# Patient Record
Sex: Male | Born: 1935 | Race: White | Hispanic: No | Marital: Married | Smoking: Former smoker
Health system: Southern US, Community
[De-identification: ages and names within clinical notes are randomized; demographics above are authoritative.]

## PROBLEM LIST (undated history)

## (undated) DIAGNOSIS — I2510 Atherosclerotic heart disease of native coronary artery without angina pectoris: Secondary | ICD-10-CM

## (undated) DIAGNOSIS — Z978 Presence of other specified devices: Secondary | ICD-10-CM

## (undated) DIAGNOSIS — I4510 Unspecified right bundle-branch block: Secondary | ICD-10-CM

## (undated) DIAGNOSIS — L989 Disorder of the skin and subcutaneous tissue, unspecified: Secondary | ICD-10-CM

## (undated) DIAGNOSIS — K219 Gastro-esophageal reflux disease without esophagitis: Secondary | ICD-10-CM

## (undated) DIAGNOSIS — K08109 Complete loss of teeth, unspecified cause, unspecified class: Secondary | ICD-10-CM

## (undated) DIAGNOSIS — I10 Essential (primary) hypertension: Secondary | ICD-10-CM

## (undated) DIAGNOSIS — H269 Unspecified cataract: Secondary | ICD-10-CM

## (undated) DIAGNOSIS — N451 Epididymitis: Secondary | ICD-10-CM

## (undated) DIAGNOSIS — R413 Other amnesia: Secondary | ICD-10-CM

## (undated) DIAGNOSIS — R339 Retention of urine, unspecified: Secondary | ICD-10-CM

## (undated) DIAGNOSIS — E785 Hyperlipidemia, unspecified: Secondary | ICD-10-CM

## (undated) DIAGNOSIS — E872 Acidosis: Secondary | ICD-10-CM

## (undated) DIAGNOSIS — I219 Acute myocardial infarction, unspecified: Secondary | ICD-10-CM

## (undated) DIAGNOSIS — Z0000 Encounter for general adult medical examination without abnormal findings: Secondary | ICD-10-CM

## (undated) DIAGNOSIS — N400 Enlarged prostate without lower urinary tract symptoms: Secondary | ICD-10-CM

## (undated) DIAGNOSIS — Z8719 Personal history of other diseases of the digestive system: Secondary | ICD-10-CM

## (undated) DIAGNOSIS — Z87828 Personal history of other (healed) physical injury and trauma: Secondary | ICD-10-CM

## (undated) DIAGNOSIS — N433 Hydrocele, unspecified: Secondary | ICD-10-CM

## (undated) DIAGNOSIS — I491 Atrial premature depolarization: Secondary | ICD-10-CM

## (undated) DIAGNOSIS — Z973 Presence of spectacles and contact lenses: Secondary | ICD-10-CM

## (undated) HISTORY — PX: CHOLECYSTECTOMY OPEN: SUR202

## (undated) HISTORY — DX: Epididymitis: N45.1

## (undated) HISTORY — PX: CORONARY ANGIOPLASTY: SHX604

## (undated) HISTORY — DX: Disorder of the skin and subcutaneous tissue, unspecified: L98.9

## (undated) HISTORY — DX: Acute myocardial infarction, unspecified: I21.9

## (undated) HISTORY — PX: TONSILLECTOMY: SUR1361

## (undated) HISTORY — DX: Encounter for general adult medical examination without abnormal findings: Z00.00

## (undated) HISTORY — DX: Gastro-esophageal reflux disease without esophagitis: K21.9

## (undated) HISTORY — PX: INGUINAL HERNIA REPAIR: SUR1180

## (undated) HISTORY — PX: APPENDECTOMY: SHX54

## (undated) HISTORY — PX: CHOLECYSTECTOMY: SHX55

## (undated) HISTORY — PX: HIATAL HERNIA REPAIR: SHX195

## (undated) HISTORY — PX: CORONARY ANGIOPLASTY WITH STENT PLACEMENT: SHX49

## (undated) HISTORY — DX: Essential (primary) hypertension: I10

## (undated) HISTORY — DX: Atherosclerotic heart disease of native coronary artery without angina pectoris: I25.10

## (undated) HISTORY — PX: CARDIAC CATHETERIZATION: SHX172

## (undated) HISTORY — DX: Hyperlipidemia, unspecified: E78.5

---
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
## 1994-04-14 HISTORY — PX: CORONARY ANGIOPLASTY WITH STENT PLACEMENT: SHX49

## 2000-04-22 ENCOUNTER — Inpatient Hospital Stay (HOSPITAL_COMMUNITY): Admission: EM | Admit: 2000-04-22 | Discharge: 2000-04-25 | Payer: Self-pay | Admitting: Emergency Medicine

## 2000-04-22 ENCOUNTER — Encounter: Payer: Self-pay | Admitting: Emergency Medicine

## 2000-10-23 ENCOUNTER — Encounter: Payer: Self-pay | Admitting: *Deleted

## 2000-10-23 ENCOUNTER — Inpatient Hospital Stay (HOSPITAL_COMMUNITY): Admission: EM | Admit: 2000-10-23 | Discharge: 2000-11-04 | Payer: Self-pay | Admitting: Emergency Medicine

## 2000-10-27 ENCOUNTER — Encounter: Payer: Self-pay | Admitting: *Deleted

## 2000-10-28 ENCOUNTER — Encounter: Payer: Self-pay | Admitting: *Deleted

## 2004-04-19 ENCOUNTER — Ambulatory Visit: Payer: Self-pay | Admitting: Cardiology

## 2004-10-17 ENCOUNTER — Ambulatory Visit: Payer: Self-pay | Admitting: Internal Medicine

## 2004-10-31 ENCOUNTER — Ambulatory Visit: Payer: Self-pay | Admitting: Internal Medicine

## 2005-10-20 ENCOUNTER — Ambulatory Visit: Payer: Self-pay | Admitting: Internal Medicine

## 2005-10-22 ENCOUNTER — Ambulatory Visit: Payer: Self-pay | Admitting: Internal Medicine

## 2006-04-02 ENCOUNTER — Ambulatory Visit: Payer: Self-pay | Admitting: Cardiovascular Disease

## 2006-04-03 ENCOUNTER — Ambulatory Visit: Payer: Self-pay | Admitting: Cardiology

## 2006-04-03 ENCOUNTER — Inpatient Hospital Stay (HOSPITAL_BASED_OUTPATIENT_CLINIC_OR_DEPARTMENT_OTHER): Admission: RE | Admit: 2006-04-03 | Discharge: 2006-04-03 | Payer: Self-pay | Admitting: Cardiovascular Disease

## 2006-04-21 ENCOUNTER — Ambulatory Visit: Payer: Self-pay | Admitting: Cardiology

## 2006-09-23 ENCOUNTER — Telehealth: Payer: Self-pay | Admitting: *Deleted

## 2006-10-30 ENCOUNTER — Ambulatory Visit: Payer: Self-pay | Admitting: Internal Medicine

## 2006-10-30 LAB — CONVERTED CEMR LAB
Cholesterol: 120 mg/dL (ref 0–200)
HDL: 31.1 mg/dL — ABNORMAL LOW (ref >39.0)
LDL Cholesterol: 65 mg/dL (ref 0–99)
Total CHOL/HDL Ratio: 3.9
Triglycerides: 120 mg/dL (ref 0–149)
VLDL: 24 mg/dL (ref 0–40)

## 2007-03-05 ENCOUNTER — Ambulatory Visit: Payer: Self-pay | Admitting: Family Medicine

## 2007-03-05 DIAGNOSIS — L989 Disorder of the skin and subcutaneous tissue, unspecified: Secondary | ICD-10-CM | POA: Insufficient documentation

## 2007-03-05 DIAGNOSIS — E782 Mixed hyperlipidemia: Secondary | ICD-10-CM | POA: Insufficient documentation

## 2007-03-05 DIAGNOSIS — I259 Chronic ischemic heart disease, unspecified: Secondary | ICD-10-CM | POA: Insufficient documentation

## 2007-04-29 ENCOUNTER — Ambulatory Visit: Payer: Self-pay | Admitting: Internal Medicine

## 2007-05-11 ENCOUNTER — Encounter (INDEPENDENT_AMBULATORY_CARE_PROVIDER_SITE_OTHER): Payer: Self-pay | Admitting: *Deleted

## 2007-05-11 ENCOUNTER — Ambulatory Visit: Payer: Self-pay | Admitting: Family Medicine

## 2007-05-11 LAB — CONVERTED CEMR LAB: PSA: 2.17 ng/mL (ref 0.10–4.00)

## 2007-06-01 ENCOUNTER — Encounter: Payer: Self-pay | Admitting: Family Medicine

## 2007-06-01 LAB — CONVERTED CEMR LAB
ALT: 21 units/L
AST: 18 units/L
Albumin: 4.5 g/dL
Alkaline Phosphatase: 94 units/L
BUN: 16 mg/dL
Bilirubin, Direct: 0.1 mg/dL
CO2: 26 meq/L
Calcium: 9.7 mg/dL
Chloride: 102 meq/L
Creatinine, Ser: 0.85 mg/dL
HCT: 49.9 %
Hemoglobin: 16.2 g/dL
Indirect Bilirubin: 0.4 mg/dL
MCV: 91.2 fL
Platelets: 243 10*3/uL
Potassium: 4.4 meq/L
Sodium: 140 meq/L
TSH: 2.059 microintl units/mL
Total Bilirubin: 0.5 mg/dL
Total Protein: 7.3 g/dL
WBC: 8.6 10*3/uL

## 2007-10-21 ENCOUNTER — Ambulatory Visit: Payer: Self-pay | Admitting: Internal Medicine

## 2007-11-16 ENCOUNTER — Ambulatory Visit: Payer: Self-pay | Admitting: Internal Medicine

## 2007-11-16 LAB — CONVERTED CEMR LAB
ALT: 22 units/L (ref 0–53)
AST: 23 units/L (ref 0–37)
Albumin: 3.6 g/dL (ref 3.5–5.2)
Alkaline Phosphatase: 86 units/L (ref 39–117)
Bilirubin, Direct: 0.1 mg/dL (ref 0.0–0.3)
Cholesterol: 175 mg/dL (ref 0–200)
HDL: 31.9 mg/dL — ABNORMAL LOW (ref >39.0)
LDL Cholesterol: 119 mg/dL — ABNORMAL HIGH (ref 0–99)
Total Bilirubin: 1 mg/dL (ref 0.3–1.2)
Total CHOL/HDL Ratio: 5.5
Total CK: 49 units/L (ref 7–195)
Total Protein: 6.7 g/dL (ref 6.0–8.3)
Triglycerides: 122 mg/dL (ref 0–149)
VLDL: 24 mg/dL (ref 0–40)

## 2008-08-10 ENCOUNTER — Telehealth: Payer: Self-pay | Admitting: Internal Medicine

## 2008-10-17 ENCOUNTER — Ambulatory Visit: Payer: Self-pay | Admitting: Internal Medicine

## 2008-10-17 DIAGNOSIS — I252 Old myocardial infarction: Secondary | ICD-10-CM | POA: Insufficient documentation

## 2008-10-17 DIAGNOSIS — I10 Essential (primary) hypertension: Secondary | ICD-10-CM | POA: Insufficient documentation

## 2008-10-17 DIAGNOSIS — K219 Gastro-esophageal reflux disease without esophagitis: Secondary | ICD-10-CM | POA: Insufficient documentation

## 2008-10-18 ENCOUNTER — Ambulatory Visit: Payer: Self-pay | Admitting: Internal Medicine

## 2008-10-27 LAB — CONVERTED CEMR LAB
ALT: 18 units/L (ref 0–53)
AST: 20 units/L (ref 0–37)
Albumin: 3.6 g/dL (ref 3.5–5.2)
Alkaline Phosphatase: 84 units/L (ref 39–117)
Bilirubin, Direct: 0.1 mg/dL (ref 0.0–0.3)
Cholesterol: 159 mg/dL (ref 0–200)
HDL: 35.1 mg/dL — ABNORMAL LOW (ref >39.00)
LDL Cholesterol: 98 mg/dL (ref 0–99)
Total Bilirubin: 0.9 mg/dL (ref 0.3–1.2)
Total CHOL/HDL Ratio: 5
Total Protein: 6.7 g/dL (ref 6.0–8.3)
Triglycerides: 128 mg/dL (ref 0.0–149.0)
VLDL: 25.6 mg/dL (ref 0.0–40.0)

## 2009-10-22 ENCOUNTER — Ambulatory Visit: Payer: Self-pay | Admitting: Internal Medicine

## 2009-10-24 ENCOUNTER — Encounter: Payer: Self-pay | Admitting: Internal Medicine

## 2009-11-26 ENCOUNTER — Ambulatory Visit: Payer: Self-pay | Admitting: Internal Medicine

## 2009-11-27 LAB — CONVERTED CEMR LAB
HDL: 38.5 mg/dL — ABNORMAL LOW (ref >39.00)
LDL Cholesterol: 115 mg/dL — ABNORMAL HIGH (ref 0–99)
Total CHOL/HDL Ratio: 5
Triglycerides: 151 mg/dL — ABNORMAL HIGH (ref 0.0–149.0)

## 2010-03-27 ENCOUNTER — Telehealth (INDEPENDENT_AMBULATORY_CARE_PROVIDER_SITE_OTHER): Payer: Self-pay | Admitting: *Deleted

## 2010-04-17 ENCOUNTER — Ambulatory Visit
Admission: RE | Admit: 2010-04-17 | Discharge: 2010-04-17 | Payer: Self-pay | Source: Home / Self Care | Attending: Family Medicine | Admitting: Family Medicine

## 2010-04-17 ENCOUNTER — Encounter: Payer: Self-pay | Admitting: Family Medicine

## 2010-04-17 DIAGNOSIS — N453 Epididymo-orchitis: Secondary | ICD-10-CM | POA: Insufficient documentation

## 2010-04-17 DIAGNOSIS — M549 Dorsalgia, unspecified: Secondary | ICD-10-CM | POA: Insufficient documentation

## 2010-04-17 LAB — CONVERTED CEMR LAB
Blood in Urine, dipstick: NEGATIVE
Nitrite: NEGATIVE
Specific Gravity, Urine: >=1.03
WBC Urine, dipstick: NEGATIVE

## 2010-05-14 NOTE — Assessment & Plan Note (Signed)
Summary: yearly/sl   Visit Type:  1 year follow up Primary Provider:  none  CC:  No complains.  History of Present Illness: [name] returns today for followup.  He has a h/o CAD and dyslipidemia and has done very well since his MI which has been nearly 11 yrs ago.  He denies sob.  He denies c/p today.  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  No peripheral edema.   Current Medications (verified): 1)  Aspirin 325 Mg Tabs (Aspirin) .Marland Kitchen.. 1 By Mouth Two Times A Day 2)  Ranitidine Hcl 150 Mg Caps (Ranitidine Hcl) .Marland Kitchen.. 1 By Mouth Two Times A Day 3)  Ramipril 2.5 Mg  Caps (Ramipril) .Marland Kitchen.. 1 Daily 4)  Nitroglycerin 0.4 Mg Subl (Nitroglycerin) .... Take 1 Tab Sl As Needed Chest Pain 5)  Metoprolol Tartrate 50 Mg Tabs (Metoprolol Tartrate) .... Take 1 Tablet By Mouth Every Twelve  Hours 6)  Simvastatin 10 Mg Tabs (Simvastatin) .... Take One Tablet By Mouth Daily At Bedtime  Allergies (verified): No Known Drug Allergies  Past History:  Past Medical History: Last updated: 10/17/2008 MYOCARDIAL INFARCTION (ICD-410.90) 1st at age 45 GERD (ICD-530.81) HYPERTENSION (ICD-401.9) PREVENTIVE HEALTH CARE (ICD-V70.0) HYPERLIPIDEMIA (ICD-272.2) CORONARY ARTERY DISEASE, S/P PTCA (ICD-414.9) SKIN LESIONS, MULTIPLE (ICD-709.9)  Past Surgical History: Last updated: 05/11/2007 Appendectomy Cholecystectomy Tonsilectomy Hiatal Hernia Inguinal hernia MIx4 w/ stents  Review of Systems  The patient denies chest pain, syncope, dyspnea on exertion, and peripheral edema.    Vital Signs:  Patient profile:   75 year old male Height:      62 inches Weight:      128.50 pounds BMI:     23.59 Pulse rate:   57 / minute Pulse rhythm:   regular Resp:     18 per minute BP sitting:   116 / 60  (left arm) Cuff size:   large  Vitals Entered By: Vikki Ports (October 22, 2009 10:11 AM)  Physical Exam  General:  Well developed, well nourished, in no  acute distress. Head:  normocephalic and atraumatic Eyes:  PERRLA/EOM intact; conjunctiva and lids normal. Mouth:  Teeth, gums and palate normal. Oral mucosa normal. Neck:  Neck supple, no JVD. No masses, thyromegaly or abnormal cervical nodes. Lungs:  Clear bilaterally to auscultation with no wheezes, rales, or rhonchi. Heart:  RRR with normal S1 and S2.  PMI is not enlarged or laterally displaced.  No murmurs. Abdomen:  Bowel sounds positive; abdomen soft and non-tender without masses, organomegaly, or hernias noted. No hepatosplenomegaly. Msk:  Back normal, normal gait. Muscle strength and tone normal. Pulses:  pulses normal in all 4 extremities Extremities:  No clubbing, cyanosis, edema, or deformity noted with normal full range of motion of all joints.   Neurologic:  Alert and oriented x 3.   Impression & Recommendations:  Problem # 1:  CORONARY ARTERY DISEASE, S/P PTCA (ICD-414.9) He remains pain free.  Continue current meds and activity. His updated medication list for this problem includes:    Aspirin 325 Mg Tabs (Aspirin) .Marland Kitchen... 1 by mouth two times a day    Ramipril 2.5 Mg Caps (Ramipril) .Marland Kitchen... 1 daily    Nitroglycerin 0.4 Mg Subl (Nitroglycerin) .Marland Kitchen... Take 1 tab sl as needed chest pain    Metoprolol Tartrate 50 Mg Tabs (Metoprolol tartrate) .Marland Kitchen... Take 1 tablet by mouth every twelve  hours  Problem # 2:  HYPERTENSION (ICD-401.9) His blood pressure is well controlled.  A  low sodium diet is recommended. His updated medication list for this problem includes:    Aspirin 325 Mg Tabs (Aspirin) .Marland Kitchen... 1 by mouth two times a day    Ramipril 2.5 Mg Caps (Ramipril) .Marland Kitchen... 1 daily    Metoprolol Tartrate 50 Mg Tabs (Metoprolol tartrate) .Marland Kitchen... Take 1 tablet by mouth every twelve  hours  Problem # 3:  HYPERLIPIDEMIA (ICD-272.2) He will continue his meds and will recheck fasting lipids. His updated medication list for this problem includes:    Simvastatin 10 Mg Tabs (Simvastatin) .Marland Kitchen... Take  one tablet by mouth daily at bedtime  Patient Instructions: 1)  Your physician recommends that you return for a FASTING lipid profile:  IN THE NEXT WEEK. 2)  Your physician wants you to follow-up in:  12 MONTHS WITH DR [name].  You will receive a reminder letter in the mail two months in advance. If you don't receive a letter, please call our office to schedule the follow-up appointment.

## 2010-05-16 NOTE — Assessment & Plan Note (Signed)
Summary: pain/check for gall stones/eo   Vital Signs:  Patient profile:   75 year old male Weight:      132.44 pounds Temp:     97.9 degrees F Pulse rate:   74 / minute BP sitting:   170 / 73  Vitals Entered By: Jone Baseman CMA (April 17, 2010 3:41 PM) CC: lower back pain/ hx of kidney stones Is Patient Diabetic? No Pain Assessment Patient in pain? yes     Location: back    Primary Care Provider:  none  CC:  lower back pain/ hx of kidney stones.  History of Present Illness: Pt. gives h/o 3 days ago, awakening wiht such severe back pain he was unable to walk.  He then developed Rt. low back paina nd rt. testicular swelling that is w/o pain.  Has h/o hernia repair 20-30 yrs ogo on this side.  Also thinks it might feel like a stone and he has h/o nephrolithiasis and 3 stones removed in the past.  Denies any heavy lifting, blood in urine.  Notes no pain with urination but has an inability to have a strong stream while urinating. Last PSA was 2.1.   Reports pain seems to be improving and swelling is less.  Denies fever, chills, nausea, vomiting.  Actually reports pain is significantly improved over last 1 day.  Habits & Providers  Alcohol-Tobacco-Diet     Tobacco Status: quit     Year Quit: 1989     Pack years: 30  Current Problems (verified): 1)  Special Screening Malignant Neoplasm of Prostate  (ICD-V76.44) 2)  Epididymitis, Left  (ICD-604.90) 3)  Back Pain  (ICD-724.5) 4)  Myocardial Infarction  (ICD-410.90) 5)  Gerd  (ICD-530.81) 6)  Hypertension  (ICD-401.9) 7)  Preventive Health Care  (ICD-V70.0) 8)  Hyperlipidemia  (ICD-272.2) 9)  Coronary Artery Disease, S/p Ptca  (ICD-414.9) 10)  Skin Lesions, Multiple  (ICD-709.9)  Current Medications (verified): 1)  Aspirin 325 Mg Tabs (Aspirin) .Marland Kitchen.. 1 By Mouth Two Times A Day 2)  Ranitidine Hcl 150 Mg Caps (Ranitidine Hcl) .Marland Kitchen.. 1 By Mouth Two Times A Day 3)  Ramipril 2.5 Mg  Caps (Ramipril) .Marland Kitchen.. 1 Daily 4)   Nitroglycerin 0.4 Mg Subl (Nitroglycerin) .... Take 1 Tab Sl As Needed Chest Pain 5)  Metoprolol Tartrate 50 Mg Tabs (Metoprolol Tartrate) .... Take 1 Tablet By Mouth Every Twelve  Hours 6)  Simvastatin 10 Mg Tabs (Simvastatin) .... Take One Tablet By Mouth Daily At Bedtime 7)  Doxycycline Hyclate 100 Mg Caps (Doxycycline Hyclate) .Marland Kitchen.. 1 By Mouth Two Times A Day X 10 Days  Allergies (verified): No Known Drug Allergies  Past History:  Past Medical History: Last updated: 10/17/2008 MYOCARDIAL INFARCTION (ICD-410.90) 1st at age 55 GERD (ICD-530.81) HYPERTENSION (ICD-401.9) PREVENTIVE HEALTH CARE (ICD-V70.0) HYPERLIPIDEMIA (ICD-272.2) CORONARY ARTERY DISEASE, S/P PTCA (ICD-414.9) SKIN LESIONS, MULTIPLE (ICD-709.9)  Past Surgical History: Last updated: 05/11/2007 Appendectomy Cholecystectomy Tonsilectomy Hiatal Hernia Inguinal hernia MIx4 w/ stents  Social History: Last updated: 03/05/2007 Lives w/ wife, has daughter and granddaughter locally.  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  Denies EtOH, previously smoked 1ppd x 30 years.  Risk Factors: Smoking Status: quit (04/17/2010)  Review of Systems  The patient denies anorexia, chest pain, dyspnea on exertion, prolonged cough, abdominal pain, and severe indigestion/heartburn.    Physical Exam  General:  alert, well-developed, and well-nourished.   Head:  normocephalic and atraumatic.   Lungs:  normal respiratory effort  and normal breath sounds.   Heart:  normal rate, regular rhythm, and no murmur.   Abdomen:  soft, non-tender, no abdominal hernia, and no inguinal hernia.   Genitalia:  circumcised and L testicular mass.   Msk:  normal ROM.   Psych:  Oriented X3.     Impression & Recommendations:  Problem # 1:  EPIDIDYMITIS, LEFT (ICD-604.90) ? diagnosis vs. renal stone---u/a is negative.  Sx's improving, ? already passed stone into bladder??  If signs's improve may not need anything.   If signs's fail to improve, trial of abx-->if no improvement in 5 days or so, return.  Consider urology referral.   Check PSA. Orders: [facility]- Est Level  3 (16109)  Complete Medication List: 1)  Aspirin 325 Mg Tabs (Aspirin) .Marland Kitchen.. 1 by mouth two times a day 2)  Ranitidine Hcl 150 Mg Caps (Ranitidine hcl) .Marland Kitchen.. 1 by mouth two times a day 3)  Ramipril 2.5 Mg Caps (Ramipril) .Marland Kitchen.. 1 daily 4)  Nitroglycerin 0.4 Mg Subl (Nitroglycerin) .... Take 1 tab sl as needed chest pain 5)  Metoprolol Tartrate 50 Mg Tabs (Metoprolol tartrate) .... Take 1 tablet by mouth every twelve  hours 6)  Simvastatin 10 Mg Tabs (Simvastatin) .... Take one tablet by mouth daily at bedtime 7)  Doxycycline Hyclate 100 Mg Caps (Doxycycline hyclate) .Marland Kitchen.. 1 by mouth two times a day x 10 days  Other Orders: Urinalysis-[facility] (00000) PSA (Medicare)-[facility] (U0454) Prescriptions: DOXYCYCLINE HYCLATE 100 MG CAPS (DOXYCYCLINE HYCLATE) 1 by mouth two times a day x 10 days  #20 x 0   Entered and Authorized by:   Tinnie Gens MD   Signed by:   Tinnie Gens MD on 04/17/2010   Method used:   Electronically to        [facility] [phone]* (retail)       [address]       [place], [place]  [address]       Ph: [phone]       Fax: [phone]   RxID:   [id number]    Orders Added: 1)  Urinalysis-[facility] [00000] 2)  [facility]- Est Level  3 [36644] 3)  PSA (Medicare)-[facility] [G0103]    Laboratory Results   Urine Tests  Date/Time Received: April 17, 2010 3:49 PM  Date/Time Reported: April 17, 2010 4:19 PM   Routine Urinalysis   Color: yellow Appearance: Clear Glucose: negative   (Normal Range: Negative) Bilirubin: negative   (Normal Range: Negative) Ketone: negative   (Normal Range: Negative) Spec. Gravity: >=1.030   (Normal Range: 1.003-1.035) Blood: negative   (Normal Range: Negative) pH: 5.5   (Normal Range: 5.0-8.0) Protein: negative   (Normal Range: Negative) Urobilinogen: 0.2   (Normal Range: 0-1) Nitrite:  negative   (Normal Range: Negative) Leukocyte Esterace: negative   (Normal Range: Negative)    Comments: ...............test performed by......Marland KitchenBonnie A. [place], MLS (ASCP)cm

## 2010-05-16 NOTE — Progress Notes (Signed)
   EMSI request received sent to [facility] w/ chart Kimberly Mesiemore  March 27, 2010 1:56 PM   

## 2010-07-02 ENCOUNTER — Other Ambulatory Visit: Payer: Self-pay | Admitting: Internal Medicine

## 2010-07-09 ENCOUNTER — Other Ambulatory Visit: Payer: Self-pay | Admitting: Internal Medicine

## 2010-07-09 DIAGNOSIS — I10 Essential (primary) hypertension: Secondary | ICD-10-CM

## 2010-07-09 DIAGNOSIS — E785 Hyperlipidemia, unspecified: Secondary | ICD-10-CM

## 2010-07-09 MED ORDER — SIMVASTATIN 10 MG PO TABS: 10.0000 mg | ORAL_TABLET | Freq: Every evening | ORAL | Status: DC

## 2010-07-09 MED ORDER — METOPROLOL TARTRATE 50 MG PO TABS: 50.0000 mg | ORAL_TABLET | Freq: Two times a day (BID) | ORAL | Status: DC

## 2010-08-06 ENCOUNTER — Other Ambulatory Visit: Payer: Self-pay | Admitting: Internal Medicine

## 2010-08-27 NOTE — Assessment & Plan Note (Signed)
[facility]                         ELECTROPHYSIOLOGY OFFICE NOTE   NAME:Bigley, MAXTYN NUZUM                   MRN:          [id number]  DATE:04/29/2007                            DOB:          [birth year]    Mr. Olmo returned today for follow up.  He is a very pleasant 75-  year-old male with a history ischemic heart disease status post MI with  preserved LV function, history of dyslipidemia, and recently diagnosed  multiple skin cancers status post resections.  He returns today for  follow-up.  He has been stable in the interim.  Note that he did have  his cholesterol checked and his HDL was 30, his LDL was in the 60s.  He  returns today for follow up.  He denies chest pain or shortness of  breath.  He does note some fatigue when he walks, and I suspect he has  occult claudication.  He is not particularly symptomatic from this,  however.   PHYSICAL EXAMINATION:  GENERAL:  He is a pleasant, well-appearing man in  no distress.  VITAL SIGNS:  Blood pressure today was 140/70, the pulse 62 and regular,  respirations were 18.  The weight was 132 pounds.  NECK:  No jugular distention.  LUNGS:  Clear bilaterally to auscultation.  No wheezes, rales or rhonchi  are present.  CARDIOVASCULAR:  Regular rate and rhythm.  Normal S1-S2.  EXTREMITIES:  No edema.   ELECTROCARDIOGRAM:  EKG demonstrates sinus rhythm with right bundle  branch block.   IMPRESSION:  1. Ischemic heart disease status post myocardial infarction.  2. Hypertension.  3. Dyslipidemia.   DISCUSSION:  Overall, Mr. Suydam is stable.  Today, I have asked that  he add some Niaspan to his regimen.  I also discussed with him the  importance of having a low-fat snack before taking the medicine and  taking the medication at nighttime.     [name]. [name], MD  Electronically Signed    GWT/MedQ  DD: 04/29/2007  DT: 04/29/2007  Job #: [id number]

## 2010-08-27 NOTE — Assessment & Plan Note (Signed)
[facility]                         ELECTROPHYSIOLOGY OFFICE NOTE   NAME:[name], [name]                   MRN:          [id number]  DATE:10/30/2006                            DOB:          April 24, 1935    REASON FOR VISIT:  The patient returns today for follow up.   HISTORY OF THE PRESENT ILLNESS:  The patient is a very pleasant middle-  aged male with a history of ischemic heart disease status post MI in  2000; and, also with a history of hypertension and dyslipidemia.  He  returns today for follow up.  He did have an episode of atypical chest  pain approximately seven months ago and underwent catheterization, which  demonstrated minimal progression of his coronary disease.  His LV  function was preserved.  He has had no chest pain or shortness of breath  since then and otherwise has continued to do well.  He does have some  arthritic complaints.   PHYSICAL EXAMINATION:  GENERAL APPEARANCE:  On physical exam he is a  pleasant well-appearing middle-aged man in no distress.  VITAL SIGNS:  Blood pressure 110/60.  The pulse is 79 and regular.  Respirations are 18.  The weight is 128 pounds.  NECK:  The neck reveals no jugular venous distention.  LUNGS:  The lungs are clear bilaterally to auscultation.  No wheezes,  rales or rhonchi are present.  HEART:  The cardiovascular exam reveals a regular rate and rhythm with  normal S1 nd S2.  EXTREMITIES:  The extremities have not experienced any edema.   MEDICATIONS:  The patient's medications include:  1. Lipitor 20 a day.  2. Ranitidine 50 twice a day.  3. Altace 2.5 mg daily.  4. Metoprolol 100 daily.  5. Ecotrin 325 daily.   ANCILLARY DATA:  Electrocardiogram demonstrates sinus rhythm with a  right bundle branch block a prior inferior MI.   IMPRESSION:  1. Ischemic heart disease status post myocardial infarction.  2. Dyslipidemia.  3. Hypertension.   DISCUSSION:  Overall, the patient is  stable.  We will plan to obtain a  fasting lipid as this has not been accomplished in some time.  He will  continue on his present medical therapy.  I will plan to see him back in  the office in one year.     [name]. [name], MD  Electronically Signed    GWT/MedQ  DD: 10/30/2006  DT: 10/31/2006  Job #: [id number]

## 2010-08-27 NOTE — Assessment & Plan Note (Signed)
[facility]                         ELECTROPHYSIOLOGY OFFICE NOTE   NAME:Macias, AARIB PULIDO                   MRN:          [id number]  DATE:10/21/2007                            DOB:          03/01/1936    HISTORY:  Mr. Kirwan returns today for followup.  He is a very  pleasant 75 year old male with history of ischemic heart disease,  hypertension, and dyslipidemia who returns today for followup.  The  patient was seen by me about 6 months ago, we at that time because of  dyslipidemia and inability to take Lipitor, started him on Niaspan, but  he was intolerant of this stating that he developed a rash, which went  away after he stopped his Niaspan.  The patient returns today for  followup.  He has very rare fleeting spells of chest pain, but no other  specific complaints.   PHYSICAL EXAMINATION:  GENERAL:  He is a pleasant well-appearing man in  no distress.  VITAL SIGNS:  Blood pressure was 134/67, pulse 67 and regular,  respirations were 18, weight was 131 pounds.  NECK:  Revealed no jugular venous distention.  LUNGS:  Clear bilaterally to auscultation.  No wheezes, rales, or  rhonchi were present.  CARDIOVASCULAR:  Regular rate and rhythm.  Normal S1 and S2.  ABDOMEN:  Soft and nontender.  EXTREMITIES:  Demonstrate no edema.   MEDICATIONS:  1. Zantac 150 twice a day.  2. Altace 2.5 daily.  3. Ecotrin 325 a day.  4. Metoprolol 50 a day.   EKG demonstrates sinus rhythm with right bundle-branch block.   IMPRESSION:  1. Ischemic heart disease.  2. Hypertension.  3. Dyslipidemia.   DISCUSSION:  I have discussed treatment options with Mr. Meloche.  We  will try him on the simvastatin 10 mg daily and see if he is tolerant of  this medicine.  I will see him back in 6 to 12 months, pending the  results of his labs following initiation of simvastatin.     [name]. [name], MD  Electronically Signed    GWT/MedQ  DD: 10/21/2007  DT:  10/21/2007  Job #: [id number]

## 2010-08-30 NOTE — Procedures (Signed)
[place]  Patient:    [name], [name]                   MRN: [id number] Adm. Date:  [id number] Attending:  [name]                           Procedure Report  INTUBATION REPORT  DESCRIPTION OF PROCEDURE:  The anesthesia team was called emergently to the cardiac catheterization unit for this 75 year old patient undergoing a cardiac catheterization.  Upon arrival to the cardiac catheterization unit it was noted that the patient was very combative and not responding appropriately to questions.  The patient was briefly examined and case discussed with Dr. Gerri Spore.  Systolic blood pressure 140s.  The patient was intubated with rapid sequence induction, STP 300 mg, succinylcholine 140 mg, with cricoid pressure.  A #8 endotracheal tube was placed by the CRNA without difficulty. Positive bilateral breath sounds.  Positive CO2 by EZ Cap.  Vital signs revealed systolic blood pressure again in the 140s to 150s, O2 saturation presently 100%.  PLAN:  Management per Dr. Gerri Spore.  Chest x-ray pending. DD:  10/27/00 TD:  10/27/00 Job: [id number]

## 2010-08-30 NOTE — Assessment & Plan Note (Signed)
[facility]                            CARDIOLOGY OFFICE NOTE   NAME:[name], [name]                   MRN:          [id number]  DATE:04/02/2006                            DOB:          07/23/1935    Mr. Habib is seen today as an add-on, he is a patient of Olga Millers.  He called Hospital doctor today, Cytogeneticist, and was complaining  today of substernal chest pain.  The pain was worrisome enough where he  was told to come to the office.   The patient was last seen by Dr. [name] on October 17, 2004.  He has a  history of ischemic heart disease with previous myocardial infarction  and preserved LV function.   Looking back through his records, most of his interventions were done in  2002.  At the time he has had multiple angioplasties of the right  coronary artery and apparently had 50% residual left main disease.   He is not on chronic Plavix therapy and does not have a drug eluting  stent in as far as I can tell.  Over the last week or so the patient has  been having increasing left neck and shoulder pain, some of it is  clearly musculoskeletal.  He slept on his neck wrong one night and woke  up with a very stiff neck.  He had pain on rotation of the neck to the  left and also with radiation down the arm.  However, now over the last  few days he has been having exertional symptoms that sound more  worrisome for angina.  He actually says this somewhat reminds him of his  previous symptoms in 2002.  He does not have nitroglycerin to take at  home.   The patient has noticed radiation to the right side, as well as to the  top of his head.  He clearly has had episodes that are exacerbated by  walking and relieved with rest.  He has not had any significant syncope,  PND, orthopnea, or palpitations.   There has been no undue shortness of breath.   REVIEW OF SYSTEMS:  Remarkable for him being a previous smoker.  He has  some arthritis in his  hands.   MEDICATIONS:  1. Toprol 100 mg a day.  2. Altace 2.5 mg a day.  3. An aspirin a day.  4. Ranitidine 150 mg.  5. Lipitor 20 mg a day.   The patient is a retired Curator.  He still does some work for the auto  salvage and stays busy around the house.  His wife was with him today,  she works at the [employer].   EXAMINATION:  He has a small ponytail.  Blood pressure is 120/70, pulse  is 70 and regular.  HEENT:  Normal, no carotid bruits, no thyromegaly.  LUNGS:  Clear.  He has a tattoo on his right shoulder.  There is S1, S2, and normal heart sounds.  ABDOMEN:  Benign.  LOWER EXTREMITIES:  Intact pulses, no edema.  NEURO:  Nonfocal.   IMPRESSION:  Somewhat concerned about the patient's symptoms.  I had a  long talk with the patient and his wife.  I would like him to have a  heart catheterization tomorrow in the diagnostic [facility] lab.  He has  multiple previous interventions to the right with residual 50% left main  disease.   I will give the patient a prescription for sublingual nitroglycerin in  case he gets pain tonight.  Since there is a history of right disease  with 50% left main I will not load him with Plavix, as there is a small  chance that he may have had progression of his left main disease and  require surgery.   The patient knows not to do any undue activity.  I did tell him it was  fine to take Tylenol or Motrin for his, what sounds like,  musculoskeletal neck pain as well.  Further recommendations will be  based on the results of his heart catheterization.     Noralyn Pick. Eden Emms, MD, [facility]  Electronically Signed    PCN/MedQ  DD: 04/02/2006  DT: 04/03/2006  Job #: [id number]

## 2010-08-30 NOTE — Discharge Summary (Signed)
[place]  Patient:    [name], [name]                   MRN: [id number] Adm. Date:  [id number] Disc. Date: [id number] Attending:  Junious Silk Dictator:   Chinita Pester, N.P. CC:         Ward Givens, M.D.   Discharge Summary  PRIMARY ADMISSION DIAGNOSIS:  Chest pain.  HISTORY OF PRESENT ILLNESS:  This is a 75 year old gentleman who was admitted with pain across his chest with left arm tightness and burning.  Denies shortness of breath, nausea, or diaphoresis.  The pain started two weeks prior to admission, was intermittent x 2 days.  No pain during the day of admission. The patient was eating dinner, he went away, onset with exertion.  Usually relieved with rest, but did not relieve at this time.  PAST MEDICAL HISTORY:  Positive for diabetes, status post PTCA/stent x 2 in 1997, cholecystectomy, renal calculi, and appendectomy.  HOSPITAL COURSE:  The patient was admitted and underwent a cardiac catheterization which showed left main 50% proximal, LAD proximal 30%, mid 40% distal LAD occluded after D2.  Left circumflex mid 40%.  RCA ostial 60%, proximal 30%, mid 75%, distal 99%, and 50% at PDA.  LV function was lower range of normal.  Two-vessel coronary artery disease, critical RCA, moderate main LAD.  The patient underwent PCI of his _______ RCA.  He was continued on his medical therapy and was discharged to home on all of his previous medication.  DISCHARGE MEDICATIONS: 1. Coated aspirin 325 q.d. 2. Altace 2.5 q.d. 3. Lipitor 10 nightly. 4. Toprol XL 50 q.d. 5. Plavix 75 q.d. x 4 weeks.  PROCEDURES:  The patient underwent percutaneous coronary intervention x 2 of his right coronary artery.  DISCHARGE INSTRUCTIONS:  He was not allowed to do any heavy lifting or strenuous activity for three days or driving for two.  Low-fat, low-cholesterol diet.  He was to keep his wound clean and dry.  Call the M.D. if he had any drainage  or if he developed a lump.  He was to have a BMP checked, and his Altace was to be increased to 2.5 b.i.d. at physician visit.  DISCHARGE FOLLOWUP:  He was to follow up with the P.A. in two weeks, January 25 at 10 a.m. for wound check and to follow up with Dr. [name] on February 8 at 10:45 a.m. DD:  07/16/00 TD:  07/16/00 Job: [id number]

## 2010-08-30 NOTE — Discharge Summary (Signed)
[name]  Patient:    [name], [name]                   MRN: [id number] Adm. Date:  [id number] Disc. Date: 11/04/00 Attending:  [name] Dictator:   [name], P.A. CC:         [name], M.D. [facility]  [name]. [name], M.D. [facility]  [name] [name], M.D. [facility]   Referring Physician Discharge [place]  DATE OF BIRTH:  [birth year]  PROCEDURES: 1. Cardiac catheterization. 2. Coronary angiography. 3. Left ventriculography. 4. Abdominal aortography. 5. Esophagogastroduodenoscopy. 6. Percutaneous transluminal coronary angioplasty and stent to one vessel.  HISTORY OF PRESENT ILLNESS:  [name] is a 75 year old male with known coronary artery disease, who was in his usual state of health until 10 days prior to admission when he began having episodes of postprandial and exertional chest pain that was treated with Maalox at first, but Maalox was not effective.  The pain was relieved by rest.  The patient did not try nitroglycerin.  It was 7/10 at its worse and radiated to his left arm.  It was associated with shortness of breath and diaphoresis.  He came to the emergency room.  He was admitted to rule out MI and for further evaluation.  HOSPITAL COURSE:  His initial enzymes were negative and he was taken to the catheterization lab on October 26, 2000.  The cardiac catheterization showed a 40% left main and an LAD with a 30% and a 50% stenosis.  The first diagonal had a 95% stenosis.  There was also an ostial 50% stenosis in another small branch of the LAD.  The circumflex had a 25% stenosis.  The RCA had a 50% ostial stenosis and a 20% end-stent restenosis.  In the second stent, there was a 99% end-stent restenosis.  In the third stent, there was diffuse 95% restenosis.  The distal RCA had a 50% stenosis.  It was felt that brachytherapy was indicated, so he was scheduled for this.  In the meantime, he had problems several times with  chest pain, although his cardiac enzymes were rechecked and remained negative.  He had problems with hypotension secondary to morphine, but did tolerate low-dose nitroglycerin.  The patient was additionally placed on Integrilin.  The Integrilin was stopped secondary to nosebleeds, however, the patient had increasing chest pain after that.  The EKG showed inferior ST elevations.  Additionally, his hemoglobin was rechecked and it was discovered that it had fallen to 7.6 and it was felt that he had a GI bleed as well.  He was taken to the catheterization laboratory and had PTCA and a cutting balloon to the mid RCA, reducing the stenosis to 0 with TIMI-3 flow and to the distal RCA, reducing that stenosis to less than 20% with TIMI-3 flow.  Because of problems with hypotension and because of the risk of vomiting from the bleeding and aspiration, he was intubated.  A pulmonary consult was called to managed the ventilator.  [name], M.D., from GI saw him and did an urgent endoscopy on October 27, 2000, but there was blood in the stomach and they were unable to clear it well enough to see anything.  He was transfused a total of four units of packed red blood cells. After initial transfusion, he was slightly febrile with a temperature greater than 101 degrees.  He had blood cultures drawn, but these were negative.  It was felt  that he had possibly aspirated and he was started on antibiotics. The patients condition gradually improved and by October 30, 2000, he was extubated.  He had a repeat EGD and an ulcer was found in the esophagus with clotted blood and no active bleeding.  It was felt that the lesion at the gastroesophageal junction could be a combination of a Mallory-Weiss tear and ulceration due to NG tube and gastroesophageal reflux disease.  The NG tube was removed and the patient was started on Protonix.  He stabilized after that and had no further problems.  After the patient was  extubated, he was seen by cardiac rehabilitation and ambulated several times, receiving instructions on risk factor reduction.  An additional problem was secondary to changes in insurance status.  A social work consult was called and the patient was assisted with medications and given instructions on applying for Medicaid.  A nutrition consult was called for decreased p.o. intake and the patient was advised that he could substitute it with Ensure as a supplement and also was given instructions on a low-salt and low-fat diet.  The patient improved steadily and by November 04, 2000, was considered stable for discharge.  LABORATORY VALUES:  Hemoglobin 14.3, hematocrit 42.2, WBC 9.6, platelets 178. Sodium 139, potassium 3.7, chloride 105, CO2 28, BUN 12, creatinine 0.9, glucose 96.  CHEST X-RAY:  Interval removal of Swan-Ganz catheter, minimal bibasilar atelectasis, and pulmonary vascular congestion without edema.  DISCHARGE CONDITION:  Improved.  CONSULTS: 1. [name], M.D., with [facility]. 2. Pulmonary critical care services.  DISCHARGE DIAGNOSES:  1. Acute myocardial infarction, status post percutaneous transluminal     coronary angioplasty with cutting balloon x 2 of the right coronary artery     this admission for instant restenosis.  2. Mildly decreased left ventricular function with an ejection fraction of     50% by catheterization.  3. Peptic ulcer disease with an ulcer at the gastroesophageal junction.  4. Gastroesophageal reflux disease.  5. History of myocardial infarction x 2.  6. Hyperglycemia.  Capillary blood glucoses 188 on a previous admission, but     within normal limits this admission.  7. Hyperlipidemia with a high-density lipoprotein of 33 on Lipitor.  8. History of nephrolithiasis.  9. Status post cholecystectomy. 10. Remote history of tobacco use. 11. Intolerance of morphine. 12. Fever and leukocytosis, possible aspiration.   DISCHARGE  INSTRUCTIONS: 1. His activity is to include no strenuous activity.  He is not to drive for a    week. 2. He is to stick to a low-salt and low-fat diet and to limit sweets. 3. He is to call the office for problems with the catheterization site. 4. He is to follow up with [name]. [name], M.D., on Monday, November 23, 2000,    at 12:45 p.m. 5. He is to follow up with the [place] and to call for an appointment.  DISCHARGE MEDICATIONS: 1. Lipitor 10 mg q.d. 2. Aspirin 325 mg q.d. 3. Altace 2.5 mg q.d. 4. Nitroglycerin 0.4 mg p.r.n. 5. Tequin 400 mg q.d. for eight days. 6. Toprol XL 100 mg q.d. 7. Protonix 40 mg q.d. DD:  11/04/00 TD:  11/04/00 Job: [id number]

## 2010-08-30 NOTE — Cardiovascular Report (Signed)
NAME:  [name], [name]            ACCOUNT NO.:  [id number]   MEDICAL RECORD NO.:  [id number]          PATIENT TYPE:  OIB   LOCATION:  1965                         FACILITY:  [facility]   PHYSICIAN:  Bruce R. 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  He has coronary heart disease and had three non-overlapping  stents placed in the right coronary and the proximal mid and distal  right coronary by myself many years ago.  His last intervention and cath  were done by Dr. Gerri Spore in 2002, at which time he had a cutting  balloon angioplasty for in-stent restenosis in two of the stents.  He  has done quite well until recently, when he developed symptoms of left  neck and shoulder pain.  He had one episode of exertional chest pain and  was seen by Dr. Eden [name].  Dr. Eden [name] was concerned that some of his  symptoms might be ischemic and arranged for him to be evaluated in the  [facility] lab today.   PROCEDURE:  The procedure was performed via the right femoral artery,  using an arterial sheath a 4-French preformed coronary catheters.  A  front wall anterior puncture was performed.  Omnipaque contrast was  used.  The patient tolerated the procedure well and left the laboratory  in satisfactory condition.   RESULTS:  The aortic pressure was 85/45 with mean of 61.  The left  ventricular pressure was 85/8.   Left main coronary artery was free of significant disease.   Left anterior descending artery gave rise to three diagonal branches and  two septal perforators.  There was 70% stenosis in the mid LAD and 50%  stenosis in the distal LAD, just after the third diagonal branch.  There  was 70% ostial stenosis in the first diagonal branch.   The circumflex artery gave rise to a marginal branch and a  posterolateral branch.  These vessels were irregular but there was no  major obstruction.   The right coronary artery was a large dominant vessel.  It gave rise to  a conus branch, two right ventricle branches, a posterior descending  branch, and three posterolateral branches.  There were stents in the  proximal mid and distal right coronary artery.  There was 30% narrowing  of the proximal stent and less than 20% narrowing in the mid and distal  stents.  There was 40% narrowing in the distal right coronary after the  posterior descending branch.   The left ventriculogram was performed in RAO projection.  It showed hypo  to akinesis of the inferoapical segment.  The overall motion was good  and the estimated fraction was 50%.   CONCLUSION:  Coronary artery disease, status post primary percutaneous  coronary intervention procedures as described above with nonobstructive  coronary disease with 70% mid and 50% distal stenosis in the left  anterior descending artery; 70% stenosis in the ostium of the first  diagonal  branch; irregularities in the circumflex artery; 30% narrowing  in the proximal stent and less than 20% narrowing in the mid and distal  stents in the right coronary artery with 40% narrowing in the very  distal right coronary artery; and inferoapical wall akinesis with an  estimated ejection fraction of 55%.   RECOMMENDATIONS:  The patient has nonobstructive disease and there is no  source of ischemia to explain his recent symptoms.  In view of these  findings, I think his symptoms are not likely ischemic.  We will plan to  let him go home today and follow up with Dr. Ladona [name] and his primary care  physician.      Bruce Elvera Lennox Juanda Chance, MD, [facility]  Electronically Signed     BRB/MEDQ  D:  04/03/2006  T:  04/04/2006  Job:  [id number]   cc:   Steve Canning. [name], MD  Steve Pick Eden Emms, MD, [facility]  Cardiopulmonary Lab

## 2010-08-30 NOTE — Cardiovascular Report (Signed)
[place]  Patient:    [name], [name]                   MRN: [id number] Proc. Date: 10/27/00 Adm. Date:  [id number] Attending:  [name] CC:         [name]. [name], M.D. [facility]  Cardiac Catheterization Lab   Cardiac Catheterization  PROCEDURES PERFORMED: 1. Percutaneous transluminal coronary angioplasty utilizing the cutting    balloon of the mid right coronary artery for in-stent restenosis. 2. Percutaneous transluminal coronary angioplasty utilizing the cutting    balloon of the distal right coronary artery for in-stent restenosis. 3. Right heart catheterization.  HISTORY:  [name] is a 75 year old male who presented to the hospital four days ago after a two-week history of progressive chest pain and a prolonged episode of chest pain on the day of admission.  He ruled in for a non-Q-wave myocardial infarction.  He was stabilized on medical therapy including Lovenox, aspirin, Plavix, and Integrilin.  Yesterday, we performed cardiac catheterization which revealed significant in-stent restenosis in the mid right and distal right coronary artery.  There was a 99% in-stent restenosis of the mid right coronary artery and a 95-99% in-stent restenosis in the distal right coronary artery.  The patient had chest pain during this procedure which was relieved with nitroglycerin and beta blocker.  We discussed options at length with the patient which included proceeding directly with percutaneous coronary intervention versus delaying percutaneous coronary intervention until today when we could utilize brachytherapy.  The patient strongly stated he wanted to wait to get the brachytherapy as he did not want to come back again to get another angioplasty.  We, therefore, scheduled him for an elective procedure today.  In the meantime, he was continued on Integrilin.  His Lovenox was stopped and heparin was started. Overnight, he had brief episodes  of chest pain relieved with nitroglycerin. His follow-up cardiac enzymes this morning were negative; however, this morning, he began to experience recurrent prolonged episodes of chest pain and we noted a drop in his hemoglobin from initially 14 two days ago to 10.8 this morning.  He then had an episode of hematemesis.  His Integrilin was discontinued.  Because of refractory chest pain, we felt it was necessary to proceed with coronary intervention; however, when he arrived to the holding area in the catheterization laboratory, he was tachycardic but complaining of severe ongoing chest pain.  EKG was obtained and showed inferior ST segment elevation consistent with an acute inferior wall myocardial infarction.  We did a stat hemoglobin and found that to be 7.6.  At that point, the patient was given wide-open administration of normal saline intravenous fluid.  We also sent a stat type to cross, and he was given two units of type-specific uncrossmatched blood.  The case was discussed with Dr. Claudette Head from gastroenterology.  Because of the ongoing infarction in spite of the GI bleed, we felt it was necessary to stabilize his coronary anatomy with transfusions as indicated following which we would address his GI bleeding.  He had been given IV Protonix in the morning prior to coming to the catheterization laboratory.  The patient en route to the catheterization laboratory became very agitated and uncooperative.  It was therefore necessary to sedate, paralyze, and intubate the patient which was performed by anesthesia.  We then proceeded with percutaneous coronary intervention.  PTCA PROCEDURAL NOTE:  A 7-French sheath was placed in the right femoral artery.  We initially placed a 6-French in the right femoral vein and subsequently exchanged that for an 8-French sheath.  We used a 7-French JR4 guiding catheter with sideholes.  Because of his active bleeding, we could not use a IIbIIIa  inhibitor.  We therefore administered heparin only for a total of 3500 units to maintain an ACT of just above 300 seconds.  A BMW wire was then advanced fluoroscopically into the distal right coronary artery.  We performed initial balloon dilatation of the 99% stenosis in the mid stent with a 3.0- x 15-mm Quantum balloon inflated to 12 atmospheres.  This balloon was then advanced and positioned across the area of stenosis in the distal stent and inflated to 12 atmospheres proximally and then advanced to the distal aspect of that stent and inflated to another 12 atmospheres.  There was also a 50% stenosis more distal to the stent at the bifurcation of the posterior descending artery.  We advanced this balloon over that area and inflated the balloon to six atmospheres there.  We then used a 3.5- x 10-mm cutting balloon.  This was positioned in the most distal aspect of the distal stent and inflated to six, and then 10 atmospheres.  It was then pulled back to the proximal aspect of this distal stent and inflated to 10 and then 10 atmospheres.  Then this balloon was positioned across the stent in the mid vessel.  Two inflations were performed, the first to eight and then to 10 atmospheres.  Final angiographic images revealed an excellent angiographic result at both sites.  There was 0% residual stenosis in the stent in the mid vessel and less than 20% residual stenosis in the stent in the distal vessel with TIMI-3 flow.  Of note, prior to percutaneous intervention, initial angiographic images revealed TIMI-2 flow into the distal vessel thus explaining the ST segment elevation.  TIMI-3 flow was restored after the first balloon inflations.  Following completion of the percutaneous intervention, we placed a Swan-Ganz catheter for hemodynamic monitoring to monitor his volume status.  The following pressures were obtained:  Right atrial mean pressure was 5.  Right ventricular pressure 28/6.   Pulmonary artery pressure 28/18.  Pulmonary capillary wedge mean pressure was 12.  Aortic pressure 152/88.   COMPLICATIONS RELATED TO THE PROCEDURE:  None.  RESULTS:  Successful percutaneous transluminal coronary angioplasty utilizing the cutting balloon of the mid and distal right coronary artery for in-stent restenosis.  A 99% stenosis with TIMI-2 flow in the mid vessel was reduced to 0% residual with TIMI-3 flow.  A diffuse 99% stenosis in the stent in the distal vessel was reduced to less than 20% residual with TIMI-3 flow.  The area beyond the stent that was 50% stenosed was reduced to approximately 25% residual with TIMI-3 flow.  PLAN:  No additional heparin will be utilized.  The patient will be maintained on aspirin only until his GI bleeding can be stabilized.  If we are able to stabilize him and his bleeding is controlled, then we will resume Plavix.  Dr. Russella Dar from gastroenterology has been consulted, and he will see the patient immediately after transfer to the cardiac care unit.  The patient was transferred to the cardiac care unit hemodynamically stable. DD:  10/27/00 TD:  10/27/00 Job: [id number]

## 2010-08-30 NOTE — Procedures (Signed)
[place]. [facility]  Patient:    [name], [name]                   MRN: [id number] Proc. Date: 10/28/00 Adm. Date:  [id number] Attending:  [name] CC:         [name], M.D.   Procedure Report  PROCEDURE:  Upper endoscopy.  ENDOSCOPIST:  Hedwig Morton. Juanda Chance, M.D.  INDICATIONS:  This 75 year old white male with coronary artery disease was admitted with non Q wave MI and underwent PTCA with subsequently re-stenosis The patient developed hematemesis post procedure while taking aspirin, Integrilin and other anticoagulants.  Hemoglobin dropped from 14 to 7 grams. The patient had an attempt at emergency upper endoscopy by Dr. Russella Dar yesterday but because of agitation and active bleeding he was unable to proceed with the procedure.  The patient is since then has calmed down.  He was transfused. His hemoglobin is up to 11 grams.  Nasogastric tube is draining coffee ground material.  He is undergoing upper endoscopy to assess the site of, and activity of the bleeding.  ENDOSCOPE:  Fujinon single channel videoscope.  SEDATION:  Versed 5 mg IV.  DESCRIPTION OF PROCEDURE:  Esophagus: The Fujinon single channel videoscope was passed under direct vision through posterior pharynx and esophagus.  The patient was monitored by pulse oximetry.  Her oxygen saturations were normal. The patient was reasonably cooperative.  The posterior pharynx and vallecula were normal.  Proximal and mid esophageal mucosa was also unremarkable. Nasogastric tube was removed prior to the procedure.  At the GE junction was an circular ulceration with dark clotted blood and necrotic material surrounding the ulceration which was rather shallow and measured about 1 cm in diameter.  It was on the esophageal side of the GE junction.  There were also multiple small superficial erosions at the GE junction but there was no stricture and no active bleeding.  Stomach: The stomach was  insufflated with air and showed some active erosions of the GE junction which bled on touch with the scope.  There was amount of coffee ground material coating the mucosa of the stomach.  Most of it was irrigated off and mucosa underneath it appeared normal.  There was no other mucosal lesions in the stomach including body and the gastric antrum.  Pyloric antrum was normal.  Retroflexion of the funduscope revealed, again, the small serration of the GE junction.  Duodenum: The duodenum, duodenal bulb had some old retained blood but no active lesion.  IMPRESSION: 1. Esophageal ulcer with clotted blood.  No active bleeding. 2. Status post upper GI bleed as evidenced by the retained coffee ground    material in the stomach.  PLAN:  The lesion at the GE junction may be a combination of Mallory-Weiss tear and ulceration due to NG tube and gastroesophageal reflux disease which apparently patient has had according to his wife, for several years.  He is currently stable.  He will be watched with a NG tube removed.  We will continue protonix IV and follow his H&H carefully.  We have been holding his anticoagulants at this point. DD:  10/28/00 TD:  10/28/00 Job: [id number]

## 2010-08-30 NOTE — Assessment & Plan Note (Signed)
[facility]                            CARDIOLOGY OFFICE NOTE   NAME:[name], [name]                   MRN:          [id number]  DATE:04/21/2006                            DOB:          1936/01/17    This is a patient of Dr. Lewayne Bunting, who is here for a post-cath  followup.  This is a 75 year old married white male with a history of  coronary artery disease status post 3 non-overlapping stents to the  right coronary artery by Dr. Juanda Chance many years ago.  His last cath was  in 2002, at which time he had a cutting balloon angioplasty for in-stent  restenosis of 2 of these stents.  He had done well since then, but saw  Dr. Eden Emms on April 02, 2006 with some chest, neck, shoulder, and arm  pain.  Some of it was felt to be musculoskeletal, but with his history,  a cardiac catheterization was recommended.  This was performed on  April 03, 2006 by Dr. Juanda Chance, and showed nonobstructive coronary  artery disease with 70% mid and 50% distal stenosis in the LAD, 70%  stenosis in the ostium of the first diagonal, irregularities in the  circumflex, 30% narrowing in the proximal stent, and less than 20%  narrowing in the mid and distal stents of the RCA with 40% in the very  distal RCA, inferior apical wall akinesis.  Estimated EF 55%.  His pain  was not felt to be ischemic, and he was discharged home.   The patient is still having some musculoskeletal aches and pains in his  neck and shoulders, but is doing much better taking Tylenol or Motrin,  and he has not had any further chest pain.   CURRENT MEDICATIONS:  1. Lipitor 20 mg daily.  2. Ranitidine 150 b.i.d.  3. Altace 2.5 daily.  4. Metoprolol 100 mg daily.  5. Ecotrin once a day.   PHYSICAL EXAM:  This is a pleasant 75 year old white male in no acute  distress.  Blood pressure 140/70, pulse 72, weight 134.  NECK:  Without JVD, HJR, bruit, or thyroid enlargement.  LUNGS:  Decreased breath  sounds, but clear anterior, posterior, and  lateral.  HEART:  Regular rate and rhythm at 70 beats per minute.  Normal S1, S2.  No murmur, rub, bruit, thrill, or heave noted.  ABDOMEN:  Soft without organomegaly, mass, lesions, or abnormal  tenderness.  Right groin is without hematoma or hemorrhage.  EXTREMITIES:  Without cyanosis, clubbing, or edema.  He has good distal  pulses.   IMPRESSION:  1. Chest pain, felt to be musculoskeletal in origin.  2. Coronary artery disease with nonobstructive coronary artery disease      on recent cath on April 03, 2006 with no restenosis of his right      coronary artery stents.  3. History of 3 non-overlapping stents to the right coronary artery,      the proximal, mid, and distal right coronary artery by Dr. Juanda Chance      many years ago.  4. Status post cutting balloon angioplasty for in-stent restenosis in  2 of the stents in 2002 by Dr. Gerri Spore.  5. Arthritis.   PLAN:  At this time, the patient is doing quite well.  We will make no  medication changes, and he will see Dr. [name] back in 6 months.      Jacolyn Reedy, PA-C  Electronically Signed      Jesse Sans. Daleen Squibb, MD, [facility]  Electronically Signed   ML/MedQ  DD: 04/21/2006  DT: 04/21/2006  Job #: [id number]

## 2010-08-30 NOTE — Cardiovascular Report (Signed)
[place]. [facility]  Patient:    [name], [name]                   MRN: [id number] Proc. Date: 10/26/00 Adm. Date:  [id number] Attending:  [name] CC:         [name]. [name], M.D. [facility]  Cardiac Catheterization Lab   Cardiac Catheterization  PROCEDURES PERFORMED: 1. Left heart catheterization. 2. Coronary angiography. 3. Left ventriculography. 4. Abdominal aortography.  INDICATIONS:  [name] is a 75 year old male with a history of stent placement in the right coronary artery.  In January 2002 he underwent placement of two stents in the right coronary artery (one in the mid vessel and one in the distal vessel).  He presented to the hospital with recurrent chest pain and ruled in for a non-Q-wave myocardial infarction.  He was referred for cardiac catheterization.  PROCEDURAL NOTE:  A 6-French sheath was placed in the right femoral artery. Standard Judkins 6-French catheters were utilized.  Contrast was Omnipaque. There were no complications.  RESULTS:  HEMODYNAMICS: 1. Left ventricular pressure:  140/22. 2. Aortic pressure:  134/76. There  was no aortic valve gradient.  LEFT VENTRICULOGRAM:  There is moderate akinesis in the inferior wall; otherwise normal wall motion.  Ejection fraction is calculated at 50%. There is 2+ mild mitral regurgitation.  ABDOMINAL AORTOGRAPHY:  Reveals patent renal arteries bilaterally.  There is moderate atherosclerotic disease of the distal abdominal aorta.  The right common iliac artery has diffuse 25% stenosis proximally, and a 70% stenosis just before its bifurcation.  CORONARY ARTERIOGRAPHY:  (Right dominant) 1. LEFT MAIN:  Has an eccentric 40-50% stenosis proximally. 2. LEFT ANTERIOR DESCENDING ARTERY:  Has a 30% stenosis in the proximal    vessel, and diffuse 50% stenosis in the mid vessel, and a 50% stenosis    in the distal vessel (which is relatively small).  There is small, but  normal-sized first diagonal branch, which has a 95% stenosis at its    origin.  There is a small second diagonal branch and there is a large    branching third diagonal. 3. LEFT CIRCUMFLEX:  Gives rise to a large OM1 and a small OM2.  The OM1    has 25% stenosis proximally. 4. RIGHT CORONARY ARTERY:  Has a 50% stenosis at its origin.  There is an    older stent in the proximal right coronary artery, with 20% stenosis    within the stent.  The stent in the mid right coronary artery has a    focal 99% stenosis.  The stent in the distal right coronary artery has    a diffuse 95% stenosis.  Distal to this in the distal right coronary artery    before the posterior descending artery is a 50% stenosis.  The distal    right coronary artery gives rise to a normal-sized posterior descending    artery, a small first posterolateral branch and a normal branching    second posterolateral.  IMPRESSIONS: 1. Mild decreased left ventricular systolic function, with mild    mitral regurgitation. 2. Two-vessel coronary artery disease, characterized by critical    in-stent restenosis x 2 in the mid and distal right coronary artery.    There is moderate, but what appears to be, mostly nonobstructive disease    in the LAD -- with the exception of the ostium of the first diagonal    branch, which is a relatively small vessel.  PLAN:  Percutaneous intervention of the right coronary artery, with brachytherapy; to be performed electively tomorrow.  The patient will be continued on heparin and Integrilin until that time.  Of note, the patient did experience chest pain during the catheterization procedure.  This was relieved with intravenous and sublingual nitroglycerin, and intravenous metoprolol.  He was pain-free at the conclusion of the procedure. DD:  10/26/00 TD:  10/26/00 Job: [id number]

## 2010-08-30 NOTE — Cardiovascular Report (Signed)
[place]. [facility]  Patient:    [name], [name]                   MRN: [id number] Proc. Date: 04/23/00 Adm. Date:  [id number] Attending:  Junious Silk CC:         [name]. [name], M.D. [facility]  Cardiac Catheterization Lab   Cardiac Catheterization  PROCEDURE PERFORMED: 1. Left heart catheterization with coronary angiography, left ventriculography    and abdominal aortography. 2. Percutaneous transluminal coronary angioplasty with stent placement in the    distal right coronary artery. 3. Percutaneous transluminal coronary angioplasty with stent placement in the    mid right coronary artery.  INDICATIONS:  Mr. Goldman is a 75 year old male with a history of previous stent placed in the proximal right coronary artery for an acute myocardial infarction.  He presented now with unstable angina.  CATHETERIZATION PROCEDURAL NOTE:  A 6-French sheath was placed in the right femoral artery.  Standard Judkins 6-French catheters were utilized.  Contrast was Omnipaque.  There were no complications.  RESULTS:  Hemodynamics:  Left ventricular pressure 130/24.  Aortic pressure 130/70. There was no aortic valve gradient.  Left ventriculogram:  There is moderate akinesis of the inferior apical wall. Otherwise, wall motion is normal.  Ejection fraction is calculated at 55%. There is 1+ mild mitral regurgitation.  Abdominal aortogram reveals mild to moderate diffuse atherosclerotic disease of the infrarenal abdominal aorta.  There is also mild diffuse disease of the iliac arteries with 20% stenosis in the right common iliac artery and a 70% stenosis in the left common iliac artery.  Coronary arteriography (right dominant): 1. The left main has a 50% stenosis in the proximal portion.  This is a fairly    eccentric plaque which is only seen in some views.  There was mild damping    of pressure waveform with catheter engagement. 2. The left anterior  descending artery has a diffuse 30% stenosis extending    from the proximal mid vessel followed by a 40% stenosis in the mid vessel    and a 60% stenosis in the distal vessel just after the bifurcation of a    large third diagonal branch.  The distal LAD is relatively small.  The LAD    gives rise to a normal sized first diagonal, small second diagonal, and a    large branching third diagonal. 3. The left circumflex has a 40% stenosis in the mid vessel.  It gives rise to    a large first marginal which has a 40% stenosis.  There is a small second    marginal branch. 4. Right coronary artery:  Is a dominant vessel.  It is diffusely diseased    with a tubular 60% stenosis at its origin.  There is a stent in the    proximal right coronary artery with a diffuse 30% stenosis within the    stent.  In the mid vessel beyond the stent is a 75% stenosis followed by a    30% stenosis followed by a 99% stenosis in the distal vessel.  Further down    in the distal vessel just before the origin of the posterior descending    artery is a tubular 50% stenosis.  The posterior descending artery is a    normal sized vessel with a 50% stenosis at its origin.  The first    posterolateral branch is small, and the second posterolateral branch is    normal.  IMPRESSIONS: 1. Left ventricular systolic function characterized by inferoapical akinesis,    but otherwise normal wall motion and ejection fraction in the lower range    of normal. 2. Two-vessel coronary artery disease as described with critical stenosis of    the distal right coronary artery as well as moderate, what appears to be,    nonobstructive disease in the left main artery and the left anterior    descending artery.  PLAN:  These findings were reviewed with Dr. Juanda Chance.  We both felt that the disease in the left system is moderate and probably not flow-limiting.  We therefore opted to proceed with percutaneous intervention of the right coronary  artery.  See below.  PERCUTANEOUS TRANSLUMINAL CORONARY ANGIOPLASTY PROCEDURAL NOTE:  Following completion of the diagnostic catheterization, we proceeded with coronary intervention.  The 6-French sheath in the right femoral artery was exchanged over wire for a 7-French sheath.  Heparin and Integrilin were administered per protocol.  We used a 7-French JR4 guiding catheter with sideholes and a BMW wire.  We initially performed predilatation of the distal lesion with a 3.0- x 15-mm Quantum Ranger balloon inflated to 8 atmospheres.  This same Quantum balloon was then positioned across the 75% stenosis in the mid vessel and inflated to 8 atmospheres.  After administration of intracoronary nitroglycerin, there was significant enlargement of the vessel lumen.  We therefore then placed a 4.0- x 18-mm Penta stent in the distal vessel at a deployment pressure of 8 atmospheres.  We then post dilated that stent with a 4.0- x 15-mm Quantum balloon inflated to 12 atmospheres in the distal aspect of the stent, 14 atmospheres in the proximal aspect of the stent, and 18 atmospheres in the mid portion of the stent.  We then placed a 3.5- x 8-mm Penta stent across the lesion in the mid vessel deploying the stent at 9 atmospheres.  The stent in the mid vessel was then post dilated with a Quantum balloon which was a 3.5- x 9-mm balloon inflated to 14 atmospheres.  Finally, we went back with a 4.0- x 15-mm Quantum balloon in the distal stent and inflated this to 20 atmospheres.  This resulted in improvement in the diameter of the vessel; however, after this dilatation, there was spontaneous appearance of a filling defect consistent with possible thrombus in the distal vessel well beyond where this stent was placed.  The location of this in the area of the 50% stenosis just before the posterior descending artery.  We therefore readvanced a 3.0- x 15-mm Quantum balloon and positioned it across this filling  defect in the distal vessel and inflated it to 4 atmospheres.  Angiographic images following this revealed resolution of the filling defect with no evidence of residual thrombus or embolism.  There was TIMI-3 flow in the distal vessel with a residual 20% stenosis in the stented site in the distal vessel and 0% residual stenosis in the stented site in the mid vessel.  COMPLICATIONS:  None.  RESULTS: 1. Successful PTCA with stent placement in the mid right coronary artery    reducing a 99% stenosis to 20% residual with TIMI-3 flow. 2. Successful PTCA with stent placement in the mid right coronary artery    reducing a 75% stenosis to 0% residual with TIMI-3 flow.  PLAN:  Integrilin will be continued for an additional 24 hours.  Plavix will be administered for 4 weeks.  The patient needs aggressive risk factor modification. DD:  04/23/00 TD:  04/23/00 Job:  [id number]

## 2010-09-20 ENCOUNTER — Encounter: Payer: Self-pay | Admitting: Internal Medicine

## 2010-11-12 ENCOUNTER — Encounter: Payer: Self-pay | Admitting: Internal Medicine

## 2010-11-12 ENCOUNTER — Ambulatory Visit (INDEPENDENT_AMBULATORY_CARE_PROVIDER_SITE_OTHER): Payer: Self-pay | Admitting: Internal Medicine

## 2010-11-12 VITALS — BP 140/74 | HR 55 | Ht 64.0 in | Wt 131.8 lb

## 2010-11-12 DIAGNOSIS — I10 Essential (primary) hypertension: Secondary | ICD-10-CM

## 2010-11-12 DIAGNOSIS — E782 Mixed hyperlipidemia: Secondary | ICD-10-CM

## 2010-11-12 DIAGNOSIS — I259 Chronic ischemic heart disease, unspecified: Secondary | ICD-10-CM

## 2010-11-12 NOTE — Patient Instructions (Signed)
Your physician wants you to follow-up in: 12 months with Dr. Taylor. You will receive a reminder letter in the mail two months in advance. If you don't receive a letter, please call our office to schedule the follow-up appointment.    

## 2010-11-13 ENCOUNTER — Encounter: Payer: Self-pay | Admitting: Internal Medicine

## 2010-11-13 NOTE — Assessment & Plan Note (Signed)
His blood pressure is elevated today. I have asked him to maintain a low sodium diet and his current meds.

## 2010-11-13 NOTE — Assessment & Plan Note (Signed)
He continues to do well. He will maintain his current medical therapy.

## 2010-11-13 NOTE — Assessment & Plan Note (Signed)
He will continue his statin therapy and maintain a low fat diet.  

## 2010-11-13 NOTE — Progress Notes (Signed)
HPI [name] returns today for followup. He is a pleasant 75 yo man with a h/o CAD, s/p MI, HTN, dyslipidia, and GERD. He denies c/p, sob, or peripheral edema. He has done well since his last visit. He notes that he remains active, walking on a regular basis. He notes that he will feel weak if he tries to move too fast. No Known Allergies   Current Outpatient Prescriptions  Medication Sig Dispense Refill  . aspirin 325 MG tablet Take 325 mg by mouth 2 (two) times daily.        . metoprolol (LOPRESSOR) 50 MG tablet Take 1 tablet (50 mg total) by mouth 2 (two) times daily.  60 tablet  4  . nitroGLYCERIN (NITROSTAT) 0.4 MG SL tablet Place 0.4 mg under the tongue every 5 (five) minutes as needed.        . ramipril (ALTACE) 2.5 MG capsule TAKE ONE CAPSULE BY MOUTH EVERY DAY  30 capsule  6  . ranitidine (ZANTAC) 150 MG capsule Take 150 mg by mouth 2 (two) times daily.        . simvastatin (ZOCOR) 10 MG tablet Take 1 tablet (10 mg total) by mouth every evening.  30 tablet  4     Past Medical History  Diagnosis Date  . MI (myocardial infarction)     first at age 27  . GERD (gastroesophageal reflux disease)   . Hypertension   . Hyperlipidemia   . Coronary artery disease   . Skin lesions, generalized   . Routine general medical examination at a health care facility     ROS:   All systems reviewed and negative except as noted in the HPI.   Past Surgical History  Procedure Date  . Appendectomy   . Cholecystectomy   . Tonsillectomy   . Hiatal hernia repair   . Inguinal hernia repair   . Cardiac catheterization     MI x4 with stents      No family history on file.   History   Social History  . Marital Status: Married    Spouse Name: N/A    Number of Children: N/A  . Years of Education: N/A   Occupational History  . Not on file.   Social History Main Topics  . Smoking status: Former Smoker -- 1.0 packs/day for 30 years  . Smokeless tobacco: Not on file  . Alcohol  Use: No  . Drug Use:   . Sexually Active:    Other Topics Concern  . Not on file   Social History Narrative  . No narrative on file     BP 140/74  Pulse 55  Ht 5\' 4"  (1.626 m)  Wt 131 lb 12.8 oz (59.784 kg)  BMI 22.62 kg/m2  Physical Exam:  Well appearing NAD HEENT: Unremarkable Neck:  No JVD, no thyromegally Lymphatics:  No adenopathy Back:  No CVA tenderness Lungs:  Clear HEART:  Regular rate rhythm, no murmurs, no rubs, no clicks Abd:  soft, positive bowel sounds, no organomegally, no rebound, no guarding Ext:  2 plus pulses, no edema, no cyanosis, no clubbing Skin:  No rashes no nodules Neuro:  CN II through XII intact, motor grossly intact  EKG NSR with RBBB.  Assess/Plan:

## 2010-12-01 ENCOUNTER — Other Ambulatory Visit: Payer: Self-pay | Admitting: Internal Medicine

## 2010-12-15 ENCOUNTER — Other Ambulatory Visit: Payer: Self-pay | Admitting: Internal Medicine

## 2011-01-15 ENCOUNTER — Encounter: Payer: Self-pay | Admitting: Internal Medicine

## 2011-03-16 ENCOUNTER — Other Ambulatory Visit: Payer: Self-pay | Admitting: Internal Medicine

## 2011-07-06 ENCOUNTER — Other Ambulatory Visit: Payer: Self-pay | Admitting: Internal Medicine

## 2011-07-07 ENCOUNTER — Other Ambulatory Visit: Payer: Self-pay

## 2011-07-07 MED ORDER — SIMVASTATIN 10 MG PO TABS: 10.0000 mg | ORAL_TABLET | Freq: Every day | ORAL | Status: DC

## 2011-11-26 ENCOUNTER — Ambulatory Visit: Payer: 59 | Admitting: Internal Medicine

## 2011-11-30 ENCOUNTER — Other Ambulatory Visit: Payer: Self-pay | Admitting: Internal Medicine

## 2011-12-28 ENCOUNTER — Other Ambulatory Visit: Payer: Self-pay | Admitting: Internal Medicine

## 2012-01-12 ENCOUNTER — Ambulatory Visit (INDEPENDENT_AMBULATORY_CARE_PROVIDER_SITE_OTHER): Payer: 59 | Admitting: Internal Medicine

## 2012-01-12 ENCOUNTER — Encounter: Payer: Self-pay | Admitting: Internal Medicine

## 2012-01-12 VITALS — BP 131/70 | HR 66 | Ht 64.0 in | Wt 130.0 lb

## 2012-01-12 DIAGNOSIS — R0602 Shortness of breath: Secondary | ICD-10-CM

## 2012-01-12 DIAGNOSIS — I259 Chronic ischemic heart disease, unspecified: Secondary | ICD-10-CM

## 2012-01-12 DIAGNOSIS — I25810 Atherosclerosis of coronary artery bypass graft(s) without angina pectoris: Secondary | ICD-10-CM

## 2012-01-12 DIAGNOSIS — E782 Mixed hyperlipidemia: Secondary | ICD-10-CM

## 2012-01-12 DIAGNOSIS — I10 Essential (primary) hypertension: Secondary | ICD-10-CM

## 2012-01-12 NOTE — Assessment & Plan Note (Signed)
His blood pressure remains well controlled. He is encouraged to maintain a low-sodium diet. No change in medical therapy today.

## 2012-01-12 NOTE — Progress Notes (Signed)
HPI [name] returns today for followup. He is a very pleasant 76 year old man with coronary artery disease status post inferior MI, dyslipidemia, and hypertension. In the interim, he has done well. He remains active. He denies chest pain or shortness of breath. No peripheral edema. No Known Allergies   Current Outpatient Prescriptions  Medication Sig Dispense Refill  . aspirin 325 MG tablet Take 325 mg by mouth daily.       . metoprolol (LOPRESSOR) 50 MG tablet TAKE ONE TABLET BY MOUTH TWICE DAILY  60 tablet  0  . ramipril (ALTACE) 2.5 MG capsule TAKE ONE CAPSULE BY MOUTH EVERY DAY  90 capsule  3  . ranitidine (ZANTAC) 150 MG capsule Take 150 mg by mouth 2 (two) times daily.        . simvastatin (ZOCOR) 10 MG tablet TAKE ONE TABLET BY MOUTH AT BEDTIME  30 tablet  1  . nitroGLYCERIN (NITROSTAT) 0.4 MG SL tablet Place 0.4 mg under the tongue every 5 (five) minutes as needed.           Past Medical History  Diagnosis Date  . MI (myocardial infarction)     first at age 45  . GERD (gastroesophageal reflux disease)   . Hypertension   . Hyperlipidemia   . Coronary artery disease   . Skin lesions, generalized   . Routine general medical examination at a health care facility     ROS:   All systems reviewed and negative except as noted in the HPI.   Past Surgical History  Procedure Date  . Appendectomy   . Cholecystectomy   . Tonsillectomy   . Hiatal hernia repair   . Inguinal hernia repair   . Cardiac catheterization     MI x4 with stents      No family history on file.   History   Social History  . Marital Status: Married    Spouse Name: N/A    Number of Children: N/A  . Years of Education: N/A   Occupational History  . Not on file.   Social History Main Topics  . Smoking status: Former Smoker -- 1.0 packs/day for 30 years  . Smokeless tobacco: Not on file  . Alcohol Use: No  . Drug Use:   . Sexually Active:    Other Topics Concern  . Not on file    Social History Narrative  . No narrative on file     BP 131/70  Pulse 66  Ht 5\' 4"  (1.626 m)  Wt 130 lb (58.968 kg)  BMI 22.31 kg/m2  Physical Exam:  Well appearing 76 year old man, NAD HEENT: Unremarkable Neck:  No JVD, no thyromegally Lungs:  Clear with no wheezes, rales, or rhonchi. HEART:  Regular rate rhythm, no murmurs, no rubs, no clicks Abd:  soft, positive bowel sounds, no organomegally, no rebound, no guarding Ext:  2 plus pulses, no edema, no cyanosis, no clubbing Skin:  No rashes no nodules Neuro:  CN II through XII intact, motor grossly intact  EKg Normal sinus rhythm with right bundle branch block and inferior MI  Assess/Plan:

## 2012-01-12 NOTE — Patient Instructions (Addendum)
Your physician recommends that you return for a FASTING lipid profile: TWO DAYS, October 2ND.lipids/liver/bmet/bnp/tsh  Your physician wants you to follow-up in: ONE YEAR WITH DR. [name].   You will receive a reminder letter in the mail two months in advance. If you don't receive a letter, please call our office to schedule the follow-up appointment.   Your physician recommends that you continue on your current medications as directed. Please refer to the Current Medication list given to you today.

## 2012-01-12 NOTE — Assessment & Plan Note (Signed)
The patient has had no recurrent anginal symptoms. He will continue his current medical therapy.

## 2012-01-12 NOTE — Assessment & Plan Note (Signed)
He will continue simvastatin. We'll obtain fasting lipids, TSH, and basic metabolic profile.

## 2012-01-14 ENCOUNTER — Other Ambulatory Visit (INDEPENDENT_AMBULATORY_CARE_PROVIDER_SITE_OTHER): Payer: 59

## 2012-01-14 DIAGNOSIS — E782 Mixed hyperlipidemia: Secondary | ICD-10-CM

## 2012-01-14 DIAGNOSIS — R0602 Shortness of breath: Secondary | ICD-10-CM

## 2012-01-14 DIAGNOSIS — I25810 Atherosclerosis of coronary artery bypass graft(s) without angina pectoris: Secondary | ICD-10-CM

## 2012-01-14 LAB — HEPATIC FUNCTION PANEL
AST: 18 U/L (ref 0–37)
Albumin: 3.9 g/dL (ref 3.5–5.2)
Alkaline Phosphatase: 73 U/L (ref 39–117)
Bilirubin, Direct: 0.1 mg/dL (ref 0.0–0.3)
Total Protein: 6.9 g/dL (ref 6.0–8.3)

## 2012-01-14 LAB — TSH: TSH: 1.41 u[IU]/mL (ref 0.35–5.50)

## 2012-01-14 LAB — BASIC METABOLIC PANEL
Calcium: 8.9 mg/dL (ref 8.4–10.5)
GFR: 82.36 mL/min (ref >60.00)
Sodium: 141 mEq/L (ref 135–145)

## 2012-01-14 LAB — LIPID PANEL
HDL: 35 mg/dL — ABNORMAL LOW (ref >39.00)
Triglycerides: 102 mg/dL (ref 0.0–149.0)

## 2012-02-01 ENCOUNTER — Other Ambulatory Visit: Payer: Self-pay | Admitting: Internal Medicine

## 2012-02-08 ENCOUNTER — Other Ambulatory Visit: Payer: Self-pay | Admitting: Internal Medicine

## 2012-03-28 ENCOUNTER — Other Ambulatory Visit: Payer: Self-pay | Admitting: Internal Medicine

## 2012-05-13 ENCOUNTER — Encounter (INDEPENDENT_AMBULATORY_CARE_PROVIDER_SITE_OTHER): Payer: 59 | Admitting: Ophthalmology

## 2012-05-13 DIAGNOSIS — H43819 Vitreous degeneration, unspecified eye: Secondary | ICD-10-CM

## 2012-05-13 DIAGNOSIS — H2510 Age-related nuclear cataract, unspecified eye: Secondary | ICD-10-CM

## 2012-05-13 DIAGNOSIS — I10 Essential (primary) hypertension: Secondary | ICD-10-CM

## 2012-05-13 DIAGNOSIS — D3130 Benign neoplasm of unspecified choroid: Secondary | ICD-10-CM

## 2012-05-13 DIAGNOSIS — H35039 Hypertensive retinopathy, unspecified eye: Secondary | ICD-10-CM

## 2012-08-04 ENCOUNTER — Ambulatory Visit (INDEPENDENT_AMBULATORY_CARE_PROVIDER_SITE_OTHER): Payer: 59 | Admitting: Internal Medicine

## 2012-08-04 ENCOUNTER — Encounter: Payer: Self-pay | Admitting: Internal Medicine

## 2012-08-04 VITALS — BP 140/70 | HR 60 | Ht 64.0 in | Wt 125.0 lb

## 2012-08-04 DIAGNOSIS — I259 Chronic ischemic heart disease, unspecified: Secondary | ICD-10-CM

## 2012-08-04 DIAGNOSIS — I10 Essential (primary) hypertension: Secondary | ICD-10-CM

## 2012-08-04 DIAGNOSIS — E782 Mixed hyperlipidemia: Secondary | ICD-10-CM

## 2012-08-04 NOTE — Progress Notes (Signed)
HPI [name] returns today for followup. He is a pleasant 77 yo man with a h/o CAD, s/p MI, dyslipidemia and baseline RBBB. In the interim, he has done well. He notes that he gets tired when working, but denies chest pain or shortness of breath. No syncope. No peripheral edema. No Known Allergies   Current Outpatient Prescriptions  Medication Sig Dispense Refill  . aspirin 325 MG tablet Take 325 mg by mouth daily.       . metoprolol (LOPRESSOR) 50 MG tablet TAKE ONE TABLET BY MOUTH TWICE DAILY (KEEP  01/12/12  APPOINTMENT  FOR  FUTURE  REFILLS)  60 tablet  6  . nitroGLYCERIN (NITROSTAT) 0.4 MG SL tablet Place 0.4 mg under the tongue every 5 (five) minutes as needed.        . ramipril (ALTACE) 2.5 MG capsule TAKE ONE CAPSULE BY MOUTH EVERY DAY  90 capsule  2  . ranitidine (ZANTAC) 150 MG capsule Take 150 mg by mouth 2 (two) times daily.        . simvastatin (ZOCOR) 10 MG tablet TAKE ONE TABLET BY MOUTH EVERY DAY AT BEDTIME(PATIENT MUST KEEP SEPTEMBER APPOINTMENT FOR FUTURE REFILLS)  30 tablet  6   No current facility-administered medications for this visit.     Past Medical History  Diagnosis Date  . MI (myocardial infarction)     first at age 73  . GERD (gastroesophageal reflux disease)   . Hypertension   . Hyperlipidemia   . Coronary artery disease   . Skin lesions, generalized   . Routine general medical examination at a health care facility     ROS:   All systems reviewed and negative except as noted in the HPI.   Past Surgical History  Procedure Laterality Date  . Appendectomy    . Cholecystectomy    . Tonsillectomy    . Hiatal hernia repair    . Inguinal hernia repair    . Cardiac catheterization      MI x4 with stents      No family history on file.   History   Social History  . Marital Status: Married    Spouse Name: N/A    Number of Children: N/A  . Years of Education: N/A   Occupational History  . Not on file.   Social History Main Topics  .  Smoking status: Former Smoker -- 1.00 packs/day for 30 years  . Smokeless tobacco: Not on file  . Alcohol Use: No  . Drug Use:   . Sexually Active:    Other Topics Concern  . Not on file   Social History Narrative  . No narrative on file     BP 140/70  Pulse 60  Ht 5\' 4"  (1.626 m)  Wt 125 lb (56.7 kg)  BMI 21.45 kg/m2  Physical Exam:  Well appearing 77 year old man,NAD HEENT: Unremarkable Neck:  7 cm JVD, no thyromegally Back:  No CVA tenderness Lungs:  Clear with no wheezes, rales, or rhonchi. HEART:  Regular rate rhythm, no murmurs, no rubs, no clicks Abd:  soft, positive bowel sounds, no organomegally, no rebound, no guarding Ext:  2 plus pulses, no edema, no cyanosis, no clubbing Skin:  No rashes no nodules Neuro:  CN II through XII intact, motor grossly intact  EKG - sinus bradycardia with right bundle branch block  Assess/Plan:

## 2012-08-04 NOTE — Patient Instructions (Addendum)
Your physician wants you to follow-up in: 12 months with Dr. Taylor. You will receive a reminder letter in the mail two months in advance. If you don't receive a letter, please call our office to schedule the follow-up appointment.    

## 2012-08-04 NOTE — Assessment & Plan Note (Signed)
Review of his lipid profile from 6 months ago demonstrates an LDL cholesterol of 79. He is encouraged to maintain a low-fat diet. He will continue his simvastatin.

## 2012-08-04 NOTE — Assessment & Plan Note (Signed)
He remains active and has no anginal symptoms. He will continue his current medical therapy. 

## 2012-08-04 NOTE — Assessment & Plan Note (Signed)
His blood pressure has been well-controlled. He will continue his current medical therapy. I've encouraged the patient to maintain a low-sodium diet.

## 2012-08-28 ENCOUNTER — Other Ambulatory Visit: Payer: Self-pay | Admitting: Internal Medicine

## 2012-09-26 ENCOUNTER — Other Ambulatory Visit: Payer: Self-pay | Admitting: Internal Medicine

## 2012-10-31 ENCOUNTER — Other Ambulatory Visit: Payer: Self-pay | Admitting: Internal Medicine

## 2013-01-09 ENCOUNTER — Other Ambulatory Visit: Payer: Self-pay | Admitting: Internal Medicine

## 2013-08-09 ENCOUNTER — Encounter: Payer: Self-pay | Admitting: Internal Medicine

## 2013-08-09 ENCOUNTER — Ambulatory Visit (INDEPENDENT_AMBULATORY_CARE_PROVIDER_SITE_OTHER): Payer: 59 | Admitting: Internal Medicine

## 2013-08-09 VITALS — BP 145/70 | HR 69 | Ht 64.0 in | Wt 125.0 lb

## 2013-08-09 DIAGNOSIS — I10 Essential (primary) hypertension: Secondary | ICD-10-CM

## 2013-08-09 DIAGNOSIS — I259 Chronic ischemic heart disease, unspecified: Secondary | ICD-10-CM

## 2013-08-09 MED ORDER — SIMVASTATIN 10 MG PO TABS
ORAL_TABLET | ORAL | Status: DC
Start: 2013-08-09 — End: 2014-08-06

## 2013-08-09 NOTE — Assessment & Plan Note (Signed)
His blood pressure has been reasonably well controlled. He will continue his current meds.

## 2013-08-09 NOTE — Assessment & Plan Note (Signed)
He has had no anginal symptoms. He will continue his current meds. No change in activity.

## 2013-08-09 NOTE — Patient Instructions (Addendum)
Your physician wants you to follow-up in: 12 months with Dr. Taylor. You will receive a reminder letter in the mail two months in advance. If you don't receive a letter, please call our office to schedule the follow-up appointment.    

## 2013-08-09 NOTE — Progress Notes (Signed)
HPI [name] returns today for followup. He is a pleasant 78 yo man with a h/o CAD, s/p MI, dyslipidemia and baseline RBBB. In the interim, he has done well. He notes that he gets tired when working, but denies chest pain or shortness of breath. No syncope. No peripheral edema. He has not been in the hospital. No Known Allergies   Current Outpatient Prescriptions  Medication Sig Dispense Refill  . aspirin 325 MG tablet Take 325 mg by mouth daily.       . metoprolol (LOPRESSOR) 50 MG tablet TAKE ONE TABLET BY MOUTH TWICE DAILY  60 tablet  8  . nitroGLYCERIN (NITROSTAT) 0.4 MG SL tablet Place 0.4 mg under the tongue every 5 (five) minutes as needed.        . ramipril (ALTACE) 2.5 MG capsule TAKE ONE CAPSULE BY MOUTH EVERY DAY  30 capsule  6  . ranitidine (ZANTAC) 150 MG capsule Take 150 mg by mouth 2 (two) times daily.        . simvastatin (ZOCOR) 10 MG tablet TAKE ONE TABLET BY MOUTH ONCE DAILY AT BEDTIME  90 tablet  3   No current facility-administered medications for this visit.     Past Medical History  Diagnosis Date  . MI (myocardial infarction)     first at age 88  . GERD (gastroesophageal reflux disease)   . Hypertension   . Hyperlipidemia   . Coronary artery disease   . Skin lesions, generalized   . Routine general medical examination at a health care facility     ROS:   All systems reviewed and negative except as noted in the HPI.   Past Surgical History  Procedure Laterality Date  . Appendectomy    . Cholecystectomy    . Tonsillectomy    . Hiatal hernia repair    . Inguinal hernia repair    . Cardiac catheterization      MI x4 with stents      No family history on file.   History   Social History  . Marital Status: Married    Spouse Name: N/A    Number of Children: N/A  . Years of Education: N/A   Occupational History  . Not on file.   Social History Main Topics  . Smoking status: Former Smoker -- 1.00 packs/day for 30 years  . Smokeless  tobacco: Not on file  . Alcohol Use: No  . Drug Use:   . Sexual Activity:    Other Topics Concern  . Not on file   Social History Narrative  . No narrative on file     BP 145/70  Pulse 69  Ht 5\' 4"  (1.626 m)  Wt 125 lb (56.7 kg)  BMI 21.45 kg/m2  Physical Exam:  Well appearing 78 year old man,NAD HEENT: Unremarkable Neck:  7 cm JVD, no thyromegally Back:  No CVA tenderness Lungs:  Clear with no wheezes, rales, or rhonchi. HEART:  Regular rate rhythm, no murmurs, no rubs, no clicks Abd:  soft, positive bowel sounds, no organomegally, no rebound, no guarding Ext:  2 plus pulses, no edema, no cyanosis, no clubbing Skin:  No rashes no nodules Neuro:  CN II through XII intact, motor grossly intact  EKG - sinus bradycardia with right bundle branch block  Assess/Plan:

## 2013-09-04 ENCOUNTER — Other Ambulatory Visit: Payer: Self-pay | Admitting: Internal Medicine

## 2013-10-02 ENCOUNTER — Other Ambulatory Visit: Payer: Self-pay | Admitting: Internal Medicine

## 2014-03-15 ENCOUNTER — Other Ambulatory Visit: Payer: Self-pay | Admitting: Internal Medicine

## 2014-04-16 ENCOUNTER — Other Ambulatory Visit: Payer: Self-pay | Admitting: Internal Medicine

## 2014-07-16 ENCOUNTER — Other Ambulatory Visit: Payer: Self-pay | Admitting: Internal Medicine

## 2014-08-06 ENCOUNTER — Other Ambulatory Visit: Payer: Self-pay | Admitting: Internal Medicine

## 2014-08-10 ENCOUNTER — Encounter: Payer: Self-pay | Admitting: Internal Medicine

## 2014-08-10 ENCOUNTER — Ambulatory Visit (INDEPENDENT_AMBULATORY_CARE_PROVIDER_SITE_OTHER): Payer: Commercial Managed Care - HMO | Admitting: Internal Medicine

## 2014-08-10 VITALS — BP 150/70 | HR 58 | Ht 64.0 in | Wt 123.4 lb

## 2014-08-10 DIAGNOSIS — E785 Hyperlipidemia, unspecified: Secondary | ICD-10-CM

## 2014-08-10 NOTE — Progress Notes (Signed)
HPI [name] returns today for followup. He is a pleasant 79 yo man with a h/o CAD, s/p MI, dyslipidemia and baseline RBBB. In the interim, he has done well. He notes that he gets tired when working, but denies chest pain or shortness of breath. No syncope. No peripheral edema. He has not been in the hospital. 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 No Known Allergies   Current Outpatient Prescriptions  Medication Sig Dispense Refill  . aspirin 325 MG tablet Take 325 mg by mouth daily.     . metoprolol (LOPRESSOR) 50 MG tablet TAKE ONE TABLET BY MOUTH TWICE DAILY 60 tablet 0  . nitroGLYCERIN (NITROSTAT) 0.4 MG SL tablet Place 0.4 mg under the tongue every 5 (five) minutes as needed.      . ramipril (ALTACE) 2.5 MG capsule TAKE ONE CAPSULE BY MOUTH ONCE DAILY 30 capsule 3  . ranitidine (ZANTAC) 150 MG capsule Take 150 mg by mouth 2 (two) times daily.      . simvastatin (ZOCOR) 10 MG tablet TAKE ONE TABLET BY MOUTH AT BEDTIME 90 tablet 0   No current facility-administered medications for this visit.     Past Medical History  Diagnosis Date  . MI (myocardial infarction)     first at age 31  . GERD (gastroesophageal reflux disease)   . Hypertension   . Hyperlipidemia   . Coronary artery disease   . Skin lesions, generalized   . Routine general medical examination at a health care facility     ROS:   All systems reviewed and negative except as noted in the HPI.   Past Surgical History  Procedure Laterality Date  . Appendectomy    . Cholecystectomy    . Tonsillectomy    . Hiatal hernia repair    . Inguinal hernia repair    . Cardiac catheterization      MI x4 with stents      History reviewed. No pertinent family history.   History   Social History  . Marital Status: Married    Spouse Name: N/A  . Number of Children: N/A  . Years of Education: N/A   Occupational History  . Not on file.   Social History Main Topics  . Smoking status:  Former Smoker -- 1.00 packs/day for 30 years  . Smokeless tobacco: Not on file  . Alcohol Use: No  . Drug Use: Not on file  . Sexual Activity: Not on file   Other Topics Concern  . Not on file   Social History Narrative     BP 150/70 mmHg  Pulse 58  Ht 5\' 4"  (1.626 m)  Wt 123 lb 6.4 oz (55.974 kg)  BMI 21.17 kg/m2  Physical Exam:  Well appearing 79 year old man,NAD HEENT: Unremarkable Neck:  7 cm JVD, no thyromegally Back:  No CVA tenderness Lungs:  Clear with no wheezes, rales, or rhonchi. HEART:  Regular rate rhythm, no murmurs, no rubs, no clicks Abd:  soft, positive bowel sounds, no organomegally, no rebound, no guarding Ext:  2 plus pulses, no edema, no cyanosis, no clubbing Skin:  No rashes no nodules Neuro:  CN II through XII intact, motor grossly intact  EKG - sinus bradycardia with right bundle branch block  Assess/Plan:

## 2014-08-10 NOTE — Assessment & Plan Note (Signed)
He will continue his statin therapy and maintain a low fat diet.  

## 2014-08-10 NOTE — Assessment & Plan Note (Signed)
His systolic blood pressure is slightly elevated. He is encourage to reduce his sodium intake. I considered increasing his beta blocker but will hold for now.

## 2014-08-10 NOTE — Patient Instructions (Signed)
Medication Instructions:  Your physician recommends that you continue on your current medications as directed. Please refer to the Current Medication list given to you today.   Labwork: Your physician recommends that you return for lab work fasting : LIPID/LIVER/TSH   Testing/Procedures: None ordered  Follow-Up: Your physician wants you to follow-up in: 12 months with Dr Knox Saliva will receive a reminder letter in the mail two months in advance. If you don't receive a letter, please call our office to schedule the follow-up appointment.   Any Other Special Instructions Will Be Listed Below (If Applicable).

## 2014-08-10 NOTE — Assessment & Plan Note (Signed)
He denies anginal symptoms. He will continue his current meds.  

## 2014-08-14 ENCOUNTER — Other Ambulatory Visit (INDEPENDENT_AMBULATORY_CARE_PROVIDER_SITE_OTHER): Payer: Commercial Managed Care - HMO | Admitting: *Deleted

## 2014-08-14 DIAGNOSIS — E785 Hyperlipidemia, unspecified: Secondary | ICD-10-CM | POA: Diagnosis not present

## 2014-08-14 LAB — LIPID PANEL
Cholesterol: 132 mg/dL (ref 0–200)
HDL: 42.3 mg/dL (ref >39.00)
LDL Cholesterol: 72 mg/dL (ref 0–99)
NonHDL: 89.7
TRIGLYCERIDES: 89 mg/dL (ref 0.0–149.0)
Total CHOL/HDL Ratio: 3
VLDL: 17.8 mg/dL (ref 0.0–40.0)

## 2014-08-14 LAB — HEPATIC FUNCTION PANEL
ALT: 14 U/L (ref 0–53)
AST: 17 U/L (ref 0–37)
Albumin: 3.7 g/dL (ref 3.5–5.2)
Alkaline Phosphatase: 93 U/L (ref 39–117)
BILIRUBIN TOTAL: 0.4 mg/dL (ref 0.2–1.2)
Bilirubin, Direct: 0.1 mg/dL (ref 0.0–0.3)
Total Protein: 6.7 g/dL (ref 6.0–8.3)

## 2014-08-14 LAB — TSH: TSH: 1.68 u[IU]/mL (ref 0.35–4.50)

## 2014-08-14 NOTE — Addendum Note (Signed)
Addended by: Eulis Foster on: 08/14/2014 08:02 AM   Modules accepted: Orders

## 2014-08-20 ENCOUNTER — Other Ambulatory Visit: Payer: Self-pay | Admitting: Internal Medicine

## 2014-08-27 ENCOUNTER — Other Ambulatory Visit: Payer: Self-pay | Admitting: Internal Medicine

## 2014-08-28 ENCOUNTER — Other Ambulatory Visit: Payer: Self-pay

## 2014-08-28 MED ORDER — RAMIPRIL 2.5 MG PO CAPS: 2.5000 mg | ORAL_CAPSULE | Freq: Every day | ORAL | Status: DC

## 2014-11-05 ENCOUNTER — Other Ambulatory Visit: Payer: Self-pay | Admitting: Internal Medicine

## 2014-11-06 ENCOUNTER — Other Ambulatory Visit: Payer: Self-pay

## 2014-11-06 MED ORDER — SIMVASTATIN 10 MG PO TABS: 10.0000 mg | ORAL_TABLET | Freq: Every day | ORAL | Status: DC

## 2015-02-04 ENCOUNTER — Other Ambulatory Visit: Payer: Self-pay | Admitting: Internal Medicine

## 2015-06-04 DIAGNOSIS — I2510 Atherosclerotic heart disease of native coronary artery without angina pectoris: Secondary | ICD-10-CM | POA: Diagnosis not present

## 2015-06-04 DIAGNOSIS — I252 Old myocardial infarction: Secondary | ICD-10-CM | POA: Diagnosis not present

## 2015-06-04 DIAGNOSIS — Z7982 Long term (current) use of aspirin: Secondary | ICD-10-CM | POA: Diagnosis not present

## 2015-06-04 DIAGNOSIS — I10 Essential (primary) hypertension: Secondary | ICD-10-CM | POA: Diagnosis not present

## 2015-06-04 DIAGNOSIS — I700 Atherosclerosis of aorta: Secondary | ICD-10-CM | POA: Diagnosis not present

## 2015-06-04 DIAGNOSIS — Z6820 Body mass index (BMI) 20.0-20.9, adult: Secondary | ICD-10-CM | POA: Diagnosis not present

## 2015-06-04 DIAGNOSIS — Z0000 Encounter for general adult medical examination without abnormal findings: Secondary | ICD-10-CM | POA: Diagnosis not present

## 2015-06-04 DIAGNOSIS — E782 Mixed hyperlipidemia: Secondary | ICD-10-CM | POA: Diagnosis not present

## 2015-08-05 ENCOUNTER — Other Ambulatory Visit: Payer: Self-pay | Admitting: Internal Medicine

## 2015-08-06 DIAGNOSIS — H524 Presbyopia: Secondary | ICD-10-CM | POA: Diagnosis not present

## 2015-08-06 DIAGNOSIS — H5203 Hypermetropia, bilateral: Secondary | ICD-10-CM | POA: Diagnosis not present

## 2015-08-06 DIAGNOSIS — H52223 Regular astigmatism, bilateral: Secondary | ICD-10-CM | POA: Diagnosis not present

## 2015-08-06 DIAGNOSIS — H53001 Unspecified amblyopia, right eye: Secondary | ICD-10-CM | POA: Diagnosis not present

## 2015-09-07 ENCOUNTER — Other Ambulatory Visit: Payer: Self-pay | Admitting: *Deleted

## 2015-09-07 MED ORDER — RAMIPRIL 2.5 MG PO CAPS: 2.5000 mg | ORAL_CAPSULE | Freq: Every day | ORAL | Status: DC

## 2015-09-14 ENCOUNTER — Other Ambulatory Visit: Payer: Self-pay | Admitting: Internal Medicine

## 2015-09-14 MED ORDER — METOPROLOL TARTRATE 50 MG PO TABS: 50.0000 mg | ORAL_TABLET | Freq: Two times a day (BID) | ORAL | Status: DC

## 2015-09-30 ENCOUNTER — Other Ambulatory Visit: Payer: Self-pay | Admitting: Internal Medicine

## 2015-10-08 ENCOUNTER — Ambulatory Visit (INDEPENDENT_AMBULATORY_CARE_PROVIDER_SITE_OTHER): Payer: PPO | Admitting: Internal Medicine

## 2015-10-08 ENCOUNTER — Encounter: Payer: Self-pay | Admitting: Internal Medicine

## 2015-10-08 VITALS — BP 144/72 | HR 73 | Ht 64.0 in | Wt 122.4 lb

## 2015-10-08 DIAGNOSIS — I10 Essential (primary) hypertension: Secondary | ICD-10-CM | POA: Diagnosis not present

## 2015-10-08 DIAGNOSIS — Z0000 Encounter for general adult medical examination without abnormal findings: Secondary | ICD-10-CM | POA: Diagnosis not present

## 2015-10-08 NOTE — Patient Instructions (Signed)
Medication Instructions:  Your physician recommends that you continue on your current medications as directed. Please refer to the Current Medication list given to you today.   Labwork: NONE  Testing/Procedures: NONE  Follow-Up: Your physician wants you to follow-up in: YEAR WITH  DR  TAYLOR  You will receive a reminder letter in the mail two months in advance. If you don't receive a letter, please call our office to schedule the follow-up appointment.  Any Other Special Instructions Will Be Listed Below (If Applicable).     If you need a refill on your cardiac medications before your next appointment, please call your pharmacy.   

## 2015-10-09 NOTE — Progress Notes (Signed)
HPI [name] returns today for followup. He is a pleasant 80 yo man with a h/o CAD, s/p MI, dyslipidemia and baseline RBBB. In the interim, he has done well. He notes that he gets tired when working, but denies chest pain or shortness of breath. No syncope. No peripheral edema. He has not been in the hospital. 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 No Known Allergies   Current Outpatient Prescriptions  Medication Sig Dispense Refill  . aspirin 325 MG tablet Take 325 mg by mouth daily.     . metoprolol (LOPRESSOR) 50 MG tablet Take 1 tablet (50 mg total) by mouth 2 (two) times daily. 60 tablet 0  . nitroGLYCERIN (NITROSTAT) 0.4 MG SL tablet Place 0.4 mg under the tongue every 5 (five) minutes as needed.      . ramipril (ALTACE) 2.5 MG capsule TAKE ONE CAPSULE BY MOUTH ONCE DAILY 30 capsule 0  . ranitidine (ZANTAC) 150 MG capsule Take 150 mg by mouth 2 (two) times daily.      . simvastatin (ZOCOR) 10 MG tablet TAKE ONE TABLET BY MOUTH AT BEDTIME 90 tablet 0   No current facility-administered medications for this visit.     Past Medical History  Diagnosis Date  . MI (myocardial infarction) ([place])     first at age 75  . GERD (gastroesophageal reflux disease)   . Hypertension   . Hyperlipidemia   . Coronary artery disease   . Skin lesions, generalized   . Routine general medical examination at a health care facility     ROS:   All systems reviewed and negative except as noted in the HPI.   Past Surgical History  Procedure Laterality Date  . Appendectomy    . Cholecystectomy    . Tonsillectomy    . Hiatal hernia repair    . Inguinal hernia repair    . Cardiac catheterization      MI x4 with stents      No family history on file.   Social History   Social History  . Marital Status: Married    Spouse Name: N/A  . Number of Children: N/A  . Years of Education: N/A   Occupational History  . Not on file.   Social History Main Topics  . Smoking  status: Former Smoker -- 1.00 packs/day for 30 years  . Smokeless tobacco: Not on file  . Alcohol Use: No  . Drug Use: Not on file  . Sexual Activity: Not on file   Other Topics Concern  . Not on file   Social History Narrative     BP 144/72 mmHg  Pulse 73  Ht 5\' 4"  (1.626 m)  Wt 122 lb 6.4 oz (55.52 kg)  BMI 21.00 kg/m2  Physical Exam:  Well appearing 80 year old man,NAD HEENT: Unremarkable Neck:  7 cm JVD, no thyromegally Back:  No CVA tenderness Lungs:  Clear with no wheezes, rales, or rhonchi. HEART:  Regular rate rhythm, no murmurs, no rubs, no clicks Abd:  soft, positive bowel sounds, no organomegally, no rebound, no guarding Ext:  2 plus pulses, no edema, no cyanosis, no clubbing Skin:  No rashes no nodules Neuro:  CN II through XII intact, motor grossly intact  EKG - sinus bradycardia with right bundle branch block  Assess/Plan: 1. CAD - he denies anginal symptoms. He will continue his current meds. I asked him to call if he develops worsening symptoms. 2. Dyslipidemia - he will continue his statin therapy. He is  not overweight.  3. RBBB - his is asymptomatic.  [name].D.

## 2015-10-14 ENCOUNTER — Other Ambulatory Visit: Payer: Self-pay | Admitting: Internal Medicine

## 2015-10-18 ENCOUNTER — Ambulatory Visit (INDEPENDENT_AMBULATORY_CARE_PROVIDER_SITE_OTHER)
Admission: RE | Admit: 2015-10-18 | Discharge: 2015-10-18 | Disposition: A | Discharge status: Home / Self Care | Payer: PPO | Source: Ambulatory Visit | Attending: Primary Care | Admitting: Primary Care

## 2015-10-18 ENCOUNTER — Encounter: Payer: Self-pay | Admitting: Primary Care

## 2015-10-18 ENCOUNTER — Encounter (INDEPENDENT_AMBULATORY_CARE_PROVIDER_SITE_OTHER): Payer: Self-pay

## 2015-10-18 ENCOUNTER — Ambulatory Visit (INDEPENDENT_AMBULATORY_CARE_PROVIDER_SITE_OTHER): Payer: PPO | Admitting: Primary Care

## 2015-10-18 VITALS — BP 138/76 | HR 76 | Temp 97.8°F | Ht 62.0 in | Wt 120.8 lb

## 2015-10-18 DIAGNOSIS — K219 Gastro-esophageal reflux disease without esophagitis: Secondary | ICD-10-CM

## 2015-10-18 DIAGNOSIS — I10 Essential (primary) hypertension: Secondary | ICD-10-CM

## 2015-10-18 DIAGNOSIS — M25511 Pain in right shoulder: Secondary | ICD-10-CM | POA: Diagnosis not present

## 2015-10-18 DIAGNOSIS — M542 Cervicalgia: Secondary | ICD-10-CM | POA: Diagnosis not present

## 2015-10-18 DIAGNOSIS — M47812 Spondylosis without myelopathy or radiculopathy, cervical region: Secondary | ICD-10-CM | POA: Diagnosis not present

## 2015-10-18 DIAGNOSIS — R296 Repeated falls: Secondary | ICD-10-CM | POA: Diagnosis not present

## 2015-10-18 DIAGNOSIS — E782 Mixed hyperlipidemia: Secondary | ICD-10-CM | POA: Diagnosis not present

## 2015-10-18 NOTE — Progress Notes (Signed)
Subjective:    Patient ID: [name], male    DOB: 07/10/1935, 80 y.o.   MRN: [id number]  HPI  Mr. Rossow is a 80 year old male who presents today to establish care and discuss the problems mentioned below. Will obtain old records.  1) Shoulder pain: He's fallen 4 times in the past 3 months. His falls were attributed to tripping, slipping, or accidentally stepping into a hole in his yard. He has never felt dizzy or off balance prior to a fall. His last fall occurred 3 weeks ago. Each time he's fallen he's landed on his right shoulder. His shoulder has been uncomfortable for the past 3 months with gradual improvement except for the past 2-3 weeks. He experiences numbness to his right upper extremity that began about 1 month, especially when he lays on his right shoulder. He cannot raise his arm above shoulder level. Denies dizziness, but has noticed right discomfort. He's had no evaluation including imaging. He's taken tylenol as needed for pain with improvement. His pain is aggravated by use and movement.  2) Essential Hypertension: Currently managed on ramipril 2.5 mg and metoprolol 50 mg BID. His BP is stable in the office today. Follows with Cardiology annually.   3) Hyperlipidemia: Currently managed on Simvastatin 10 mg and aspirin 325 mg. History of myocardial infarction with 2 stent placements and 1 angioplasty.   Review of Systems  Respiratory: Negative for shortness of breath.   Cardiovascular: Negative for chest pain.  Musculoskeletal: Positive for arthralgias.       Right shoulder pain  Neurological: Positive for numbness. Negative for dizziness and headaches.       Past Medical History  Diagnosis Date  . MI (myocardial infarction) ([place])     first at age 72  . GERD (gastroesophageal reflux disease)   . Hypertension   . Hyperlipidemia   . Coronary artery disease   . Skin lesions, generalized   . Routine general medical examination at a health care facility      Social History   Social History  . Marital Status: Married    Spouse Name: N/A  . Number of Children: N/A  . Years of Education: N/A   Occupational History  . Not on file.   Social History Main Topics  . Smoking status: Former Smoker -- 1.00 packs/day for 30 years  . Smokeless tobacco: Not on file  . Alcohol Use: No  . Drug Use: Not on file  . Sexual Activity: Not on file   Other Topics Concern  . Not on file   Social History Narrative   Married.   6 children, 12 grandchildren, 12 great grandchildren.   Once worked as a Games developer.   Enjoys relaxing, spending time with family.    Past Surgical History  Procedure Laterality Date  . Appendectomy    . Cholecystectomy    . Tonsillectomy    . Hiatal hernia repair    . Inguinal hernia repair    . Cardiac catheterization      MI x4 with stents     Family History  Problem Relation Age of Onset  . Asthma Mother     No Known Allergies  Current Outpatient Prescriptions on File Prior to Visit  Medication Sig Dispense Refill  . aspirin 325 MG tablet Take 325 mg by mouth daily.     . metoprolol (LOPRESSOR) 50 MG tablet Take 1 tablet (50 mg total) by mouth 2 (two) times daily. 60 tablet 11  .  ramipril (ALTACE) 2.5 MG capsule TAKE ONE CAPSULE BY MOUTH ONCE DAILY 30 capsule 0  . ranitidine (ZANTAC) 150 MG capsule Take 150 mg by mouth 2 (two) times daily.      . simvastatin (ZOCOR) 10 MG tablet TAKE ONE TABLET BY MOUTH AT BEDTIME 90 tablet 0   No current facility-administered medications on file prior to visit.    BP 138/76 mmHg  Pulse 76  Temp(Src) 97.8 F (36.6 C) (Oral)  Ht 5\' 2"  (1.575 m)  Wt 120 lb 12.8 oz (54.795 kg)  BMI 22.09 kg/m2  SpO2 97%    Objective:   Physical Exam  Constitutional: He appears well-nourished.  Cardiovascular: Normal rate and regular rhythm.   Pulmonary/Chest: Effort normal and breath sounds normal.  Musculoskeletal:       Right shoulder: He exhibits decreased range of motion,  crepitus, pain and decreased strength. He exhibits no tenderness, no bony tenderness, no deformity and no laceration.       Cervical back: He exhibits pain. He exhibits normal range of motion and no tenderness.  Skin: Skin is warm and dry.          Assessment & Plan:

## 2015-10-18 NOTE — Progress Notes (Signed)
Pre visit review using our clinic review tool, if applicable. No additional management support is needed unless otherwise documented below in the visit note. 

## 2015-10-18 NOTE — Assessment & Plan Note (Signed)
Stable on Zantac.  

## 2015-10-18 NOTE — Patient Instructions (Signed)
Complete xray(s) prior to leaving today. I will notify you of your results once received.  Please take precautions to prevent from falling in your yard.  It was a pleasure to meet you today! Please don't hesitate to call me with any questions. Welcome to [facility]!  Shoulder Pain The shoulder is the joint that connects your arms to your body. The bones that form the shoulder joint include the upper arm bone (humerus), the shoulder blade (scapula), and the collarbone (clavicle). The top of the humerus is shaped like a ball and fits into a rather flat socket on the scapula (glenoid cavity). A combination of muscles and strong, fibrous tissues that connect muscles to bones (tendons) support your shoulder joint and hold the ball in the socket. Small, fluid-filled sacs (bursae) are located in different areas of the joint. They act as cushions between the bones and the overlying soft tissues and help reduce friction between the gliding tendons and the bone as you move your arm. Your shoulder joint allows a wide range of motion in your arm. This range of motion allows you to do things like scratch your back or throw a ball. However, this range of motion also makes your shoulder more prone to pain from overuse and injury. Causes of shoulder pain can originate from both injury and overuse and usually can be grouped in the following four categories:  Redness, swelling, and pain (inflammation) of the tendon (tendinitis) or the bursae (bursitis).  Instability, such as a dislocation of the joint.  Inflammation of the joint (arthritis).  Broken bone (fracture). HOME CARE INSTRUCTIONS   Apply ice to the sore area.  Put ice in a plastic bag.  Place a towel between your skin and the bag.  Leave the ice on for 15-20 minutes, 3-4 times per day for the first 2 days, or as directed by your health care provider.  Stop using cold packs if they do not help with the pain.  If you have a shoulder sling or  immobilizer, wear it as long as your caregiver instructs. Only remove it to shower or bathe. Move your arm as little as possible, but keep your hand moving to prevent swelling.  Squeeze a soft ball or foam pad as much as possible to help prevent swelling.  Only take over-the-counter or prescription medicines for pain, discomfort, or fever as directed by your caregiver. SEEK MEDICAL CARE IF:   Your shoulder pain increases, or new pain develops in your arm, hand, or fingers.  Your hand or fingers become cold and numb.  Your pain is not relieved with medicines. SEEK IMMEDIATE MEDICAL CARE IF:   Your arm, hand, or fingers are numb or tingling.  Your arm, hand, or fingers are significantly swollen or turn white or blue. MAKE SURE YOU:   Understand these instructions.  Will watch your condition.  Will get help right away if you are not doing well or get worse.   This information is not intended to replace advice given to you by your health care provider. Make sure you discuss any questions you have with your health care provider.   Document Released: 01/08/2005 Document Revised: 04/21/2014 Document Reviewed: 07/24/2014 Elsevier Interactive Patient Education [employer].

## 2015-10-18 NOTE — Assessment & Plan Note (Signed)
Currently managed on room Prill and metoprolol per cardiology. Blood pressure stable in the office today. Continue current regimen.

## 2015-10-18 NOTE — Assessment & Plan Note (Signed)
Managed on simvastatin 10 mg. Lipids one year ago stable. He follows with cardiology annually.

## 2015-10-18 NOTE — Assessment & Plan Note (Signed)
Present since initial fall 3 months ago and has since fallen 3 additional times. No dizziness or feeling off balance prior to fall, just tripped or slipped in backyard. Exam today with moderate decrease in range of motion especially with abduction. Given symptoms will further evaluate with plain films and send to physical therapy if mostly unremarkable. Discussed fall prevention in the home and when outdoors. Steady gait clinic today.

## 2015-11-04 ENCOUNTER — Other Ambulatory Visit: Payer: Self-pay | Admitting: Internal Medicine

## 2015-11-05 ENCOUNTER — Other Ambulatory Visit: Payer: Self-pay

## 2015-11-05 MED ORDER — RAMIPRIL 2.5 MG PO CAPS: 2.5000 mg | ORAL_CAPSULE | Freq: Every day | ORAL | 0 refills | Status: DC

## 2015-11-05 NOTE — Telephone Encounter (Signed)
Ruthvik A Sitzer   ramipril (ALTACE) 2.5 MG capsule TAKE ONE CAPSULE BY MOUTH ONCE DAILY   Patient Instructions   Medication Instructions:  Your physician recommends that you continue on your current medications as directed. Please refer to the Current Medication list given to you today.   Evans Lance, MD at 10/09/2015 9:31 PM

## 2015-11-15 ENCOUNTER — Other Ambulatory Visit: Payer: Self-pay | Admitting: Internal Medicine

## 2016-02-10 ENCOUNTER — Other Ambulatory Visit: Payer: Self-pay | Admitting: Internal Medicine

## 2016-02-11 ENCOUNTER — Other Ambulatory Visit: Payer: Self-pay | Admitting: *Deleted

## 2016-02-11 MED ORDER — SIMVASTATIN 10 MG PO TABS: 10.0000 mg | ORAL_TABLET | Freq: Every day | ORAL | 1 refills | Status: DC

## 2016-02-11 NOTE — Telephone Encounter (Signed)
simvastatin (ZOCOR) 10 MG tablet  Medication  Date: 02/11/2016 Department: [facility] Office Ordering/Authorizing: Evans Lance, MD  Order Providers   Prescribing Provider Encounter Provider  Evans Lance, MD Juventino Slovak, CMA  Medication Detail    Disp Refills Start End   simvastatin (ZOCOR) 10 MG tablet 90 tablet 1 02/11/2016    Sig - Route: Take 1 tablet (10 mg total) by mouth at bedtime. - Oral   E-Prescribing Status: Receipt confirmed by pharmacy (02/11/2016 11:00 AM EDT)   Pharmacy   [facility], [place] - [address]

## 2016-06-18 ENCOUNTER — Encounter (INDEPENDENT_AMBULATORY_CARE_PROVIDER_SITE_OTHER): Payer: Self-pay

## 2016-06-18 ENCOUNTER — Ambulatory Visit (INDEPENDENT_AMBULATORY_CARE_PROVIDER_SITE_OTHER): Payer: PPO

## 2016-06-18 VITALS — BP 138/80 | HR 62 | Temp 97.6°F | Ht 61.5 in | Wt 125.2 lb

## 2016-06-18 DIAGNOSIS — I10 Essential (primary) hypertension: Secondary | ICD-10-CM

## 2016-06-18 DIAGNOSIS — Z125 Encounter for screening for malignant neoplasm of prostate: Secondary | ICD-10-CM | POA: Diagnosis not present

## 2016-06-18 DIAGNOSIS — Z0000 Encounter for general adult medical examination without abnormal findings: Secondary | ICD-10-CM | POA: Diagnosis not present

## 2016-06-18 DIAGNOSIS — E784 Other hyperlipidemia: Secondary | ICD-10-CM | POA: Diagnosis not present

## 2016-06-18 DIAGNOSIS — E7849 Other hyperlipidemia: Secondary | ICD-10-CM

## 2016-06-18 LAB — CBC WITH DIFFERENTIAL/PLATELET
Basophils Absolute: 0 10*3/uL (ref 0.0–0.1)
Basophils Relative: 0.4 % (ref 0.0–3.0)
EOS ABS: 0.1 10*3/uL (ref 0.0–0.7)
Eosinophils Relative: 1.2 % (ref 0.0–5.0)
HEMATOCRIT: 47.3 % (ref 39.0–52.0)
Hemoglobin: 15.8 g/dL (ref 13.0–17.0)
LYMPHS PCT: 22.2 % (ref 12.0–46.0)
Lymphs Abs: 2.1 10*3/uL (ref 0.7–4.0)
MCHC: 33.5 g/dL (ref 30.0–36.0)
MCV: 88.7 fl (ref 78.0–100.0)
MONO ABS: 0.9 10*3/uL (ref 0.1–1.0)
Monocytes Relative: 9.6 % (ref 3.0–12.0)
NEUTROS ABS: 6.3 10*3/uL (ref 1.4–7.7)
Neutrophils Relative %: 66.6 % (ref 43.0–77.0)
PLATELETS: 225 10*3/uL (ref 150.0–400.0)
RBC: 5.34 Mil/uL (ref 4.22–5.81)
RDW: 13.4 % (ref 11.5–15.5)
WBC: 9.5 10*3/uL (ref 4.0–10.5)

## 2016-06-18 LAB — LIPID PANEL
Cholesterol: 131 mg/dL (ref 0–200)
HDL: 38.8 mg/dL — ABNORMAL LOW (ref >39.00)
LDL CALC: 77 mg/dL (ref 0–99)
NONHDL: 92.28
Total CHOL/HDL Ratio: 3
Triglycerides: 77 mg/dL (ref 0.0–149.0)
VLDL: 15.4 mg/dL (ref 0.0–40.0)

## 2016-06-18 LAB — COMPREHENSIVE METABOLIC PANEL
ALBUMIN: 4.4 g/dL (ref 3.5–5.2)
ALT: 12 U/L (ref 0–53)
AST: 18 U/L (ref 0–37)
Alkaline Phosphatase: 92 U/L (ref 39–117)
BILIRUBIN TOTAL: 0.7 mg/dL (ref 0.2–1.2)
BUN: 14 mg/dL (ref 6–23)
CALCIUM: 9.8 mg/dL (ref 8.4–10.5)
CO2: 32 meq/L (ref 19–32)
CREATININE: 0.87 mg/dL (ref 0.40–1.50)
Chloride: 103 mEq/L (ref 96–112)
GFR: 90.09 mL/min (ref >60.00)
Glucose, Bld: 89 mg/dL (ref 70–99)
Potassium: 4.4 mEq/L (ref 3.5–5.1)
Sodium: 141 mEq/L (ref 135–145)
Total Protein: 7 g/dL (ref 6.0–8.3)

## 2016-06-18 LAB — TSH: TSH: 0.97 u[IU]/mL (ref 0.35–4.50)

## 2016-06-18 LAB — PSA, MEDICARE: PSA: 2 ng/mL (ref 0.10–4.00)

## 2016-06-18 NOTE — Progress Notes (Signed)
PCP notes:   Health maintenance:  Patient has declined all vaccinations.   Abnormal screenings:   Hearing - failed Fall risk - hx of multiple falls with injury Mini-Cog score: 17/20  Patient concerns:   Patient has complaint of intermittent right shoulder pain.   Nurse concerns:  None  Next PCP appt:   06/24/16 @ 1445

## 2016-06-18 NOTE — Patient Instructions (Signed)
[name] , Thank you for taking time to come for your Medicare Wellness Visit. I appreciate your ongoing commitment to your health goals. Please review the following plan we discussed and let me know if I can assist you in the future.   These are the goals we discussed: Goals    . Increase physical activity          Starting 06/18/16, I will continue to walk at least 30 min 3-4 days per week.        This is a list of the screening recommended for you and due dates:  Health Maintenance  Topic Date Due  . Flu Shot  07/12/2025*  . Tetanus Vaccine  07/12/2025*  . Pneumonia vaccines (1 of 2 - PCV13) 07/12/2025*  *Topic was postponed. The date shown is not the original due date.   Preventive Care for Adults  A healthy lifestyle and preventive care can promote health and wellness. Preventive health guidelines for adults include the following key practices.  . A routine yearly physical is a good way to check with your health care provider about your health and preventive screening. It is a chance to share any concerns and updates on your health and to receive a thorough exam.  . Visit your dentist for a routine exam and preventive care every 6 months. Brush your teeth twice a day and floss once a day. Good oral hygiene prevents tooth decay and gum disease.  . The frequency of eye exams is based on your age, health, family medical history, use  of contact lenses, and other factors. Follow your health care provider's ecommendations for frequency of eye exams.  . Eat a healthy diet. Foods like vegetables, fruits, whole grains, low-fat dairy products, and lean protein foods contain the nutrients you need without too many calories. Decrease your intake of foods high in solid fats, added sugars, and salt. Eat the right amount of calories for you. Get information about a proper diet from your health care provider, if necessary.  . Regular physical exercise is one of the most important things you  can do for your health. Most adults should get at least 150 minutes of moderate-intensity exercise (any activity that increases your heart rate and causes you to sweat) each week. In addition, most adults need muscle-strengthening exercises on 2 or more days a week.  Silver Sneakers may be a benefit available to you. To determine eligibility, you may visit the website: www.silversneakers.com or contact program at (234)058-1109 Mon-Fri between 8AM-8PM.   . Maintain a healthy weight. The body mass index (BMI) is a screening tool to identify possible weight problems. It provides an estimate of body fat based on height and weight. Your health care provider can find your BMI and can help you achieve or maintain a healthy weight.   For adults 20 years and older: ? A BMI below 18.5 is considered underweight. ? A BMI of 18.5 to 24.9 is normal. ? A BMI of 25 to 29.9 is considered overweight. ? A BMI of 30 and above is considered obese.   . Maintain normal blood lipids and cholesterol levels by exercising and minimizing your intake of saturated fat. Eat a balanced diet with plenty of fruit and vegetables. Blood tests for lipids and cholesterol should begin at age 73 and be repeated every 5 years. If your lipid or cholesterol levels are high, you are over 50, or you are at high risk for heart disease, you may need your cholesterol  levels checked more frequently. Ongoing high lipid and cholesterol levels should be treated with medicines if diet and exercise are not working.  . If you smoke, find out from your health care provider how to quit. If you do not use tobacco, please do not start.  . If you choose to drink alcohol, please do not consume more than 2 drinks per day. One drink is considered to be 12 ounces (355 mL) of beer, 5 ounces (148 mL) of wine, or 1.5 ounces (44 mL) of liquor.  . If you are 32-44 years old, ask your health care provider if you should take aspirin to prevent strokes.  . Use  sunscreen. Apply sunscreen liberally and repeatedly throughout the day. You should seek shade when your shadow is shorter than you. Protect yourself by wearing long sleeves, pants, a wide-brimmed hat, and sunglasses year round, whenever you are outdoors.  . Once a month, do a whole body skin exam, using a mirror to look at the skin on your back. Tell your health care provider of new moles, moles that have irregular borders, moles that are larger than a pencil eraser, or moles that have changed in shape or color.

## 2016-06-18 NOTE — Progress Notes (Signed)
Subjective:   [name] is a 81 y.o. male who presents for an Initial Medicare Annual Wellness Visit.  Review of Systems  N/A Cardiac Risk Factors include: advanced age (>40men, >60 women);male gender;hypertension;dyslipidemia    Objective:    Today's Vitals   06/18/16 1406  BP: 138/80  Pulse: 62  Temp: 97.6 F (36.4 C)  TempSrc: Oral  SpO2: 95%  Weight: 125 lb 4 oz (56.8 kg)  Height: 5' 1.5" (1.562 m)  PainSc: 0-No pain   Body mass index is 23.28 kg/m.  Current Medications (verified) Outpatient Encounter Prescriptions as of 06/18/2016  Medication Sig  . aspirin 325 MG tablet Take 325 mg by mouth daily.   . metoprolol (LOPRESSOR) 50 MG tablet Take 1 tablet (50 mg total) by mouth 2 (two) times daily.  . ramipril (ALTACE) 2.5 MG capsule TAKE ONE CAPSULE BY MOUTH ONCE DAILY  . ranitidine (ZANTAC) 150 MG capsule Take 150 mg by mouth 2 (two) times daily.    . simvastatin (ZOCOR) 10 MG tablet Take 1 tablet (10 mg total) by mouth at bedtime.  . [DISCONTINUED] ramipril (ALTACE) 2.5 MG capsule Take 1 capsule (2.5 mg total) by mouth daily.   No facility-administered encounter medications on file as of 06/18/2016.     Allergies (verified) Patient has no known allergies.   History: Past Medical History:  Diagnosis Date  . Coronary artery disease   . Epididymitis    Left  . GERD (gastroesophageal reflux disease)   . Hyperlipidemia   . Hypertension   . MI (myocardial infarction)    first at age 59  . Routine general medical examination at a health care facility   . Skin lesions, generalized    Past Surgical History:  Procedure Laterality Date  . APPENDECTOMY    . CARDIAC CATHETERIZATION     MI x4 with stents   . CHOLECYSTECTOMY    . HIATAL HERNIA REPAIR    . INGUINAL HERNIA REPAIR    . TONSILLECTOMY     Family History  Problem Relation Age of Onset  . Asthma Mother    Social History   Occupational History  . Not on file.   Social History Main  Topics  . Smoking status: Former Smoker    Packs/day: 1.00    Years: 30.00    Quit date: 04/14/1986  . Smokeless tobacco: Never Used  . Alcohol use No  . Drug use: No  . Sexual activity: No   Tobacco Counseling Counseling given: No   Activities of Daily Living In your present state of health, do you have any difficulty performing the following activities: 06/18/2016  Hearing? Y  Vision? Y  Difficulty concentrating or making decisions? Y  Walking or climbing stairs? N  Dressing or bathing? N  Doing errands, shopping? N  Preparing Food and eating ? N  Using the Toilet? N  In the past six months, have you accidently leaked urine? N  Do you have problems with loss of bowel control? N  Managing your Medications? N  Managing your Finances? N  Housekeeping or managing your Housekeeping? N  Some recent data might be hidden    Immunizations and Health Maintenance  There is no immunization history on file for this patient. There are no preventive care reminders to display for this patient.  Patient Care Team: Pleas Koch, NP as PCP - General (Internal Medicine)    Assessment:   This is a routine wellness examination for [place].   Hearing/Vision  screen  Hearing Screening   125Hz  250Hz  500Hz  1000Hz  2000Hz  3000Hz  4000Hz  6000Hz  8000Hz   Right ear:   40 40 40  0    Left ear:   40 0 0  0    Vision Screening Comments: Last vision exam in 2017  Dietary issues and exercise activities discussed: Current Exercise Habits: Home exercise routine, Type of exercise: walking, Time (Minutes): 30, Frequency (Times/Week): 4, Weekly Exercise (Minutes/Week): 120, Intensity: Mild, Exercise limited by: None identified  Goals    . Increase physical activity          Starting 06/18/16, I will continue to walk at least 30 min 3-4 days per week.       Depression Screen PHQ 2/9 Scores 06/18/2016  PHQ - 2 Score 0    Fall Risk Fall Risk  06/18/2016  Falls in the past year? Yes  Number falls in  past yr: 2 or more  Injury with Fall? Yes    Cognitive Function: MMSE - Mini Mental State Exam 06/18/2016  Orientation to time 5  Orientation to Place 5  Registration 3  Attention/ Calculation 0  Recall 0  Recall-comments pt was unable to recall 3 of 3 words  Language- name 2 objects 0  Language- repeat 1  Language- follow 3 step command 3  Language- read & follow direction 0  Write a sentence 0  Copy design 0  Total score 17     PLEASE NOTE: A Mini-Cog screen was completed. Maximum score is 20. A value of 0 denotes this part of [place] MMSE was not completed or the patient failed this part of the Mini-Cog screening.   Mini-Cog Screening Orientation to Time - Max 5 pts Orientation to Place - Max 5 pts Registration - Max 3 pts Recall - Max 3 pts Language Repeat - Max 1 pts Language Follow 3 Step Command - Max 3 pts     Screening Tests Health Maintenance  Topic Date Due  . INFLUENZA VACCINE  07/12/2025 (Originally 11/13/2015)  . TETANUS/TDAP  07/12/2025 (Originally 12/23/1954)  . PNA vac Low Risk Adult (1 of 2 - PCV13) 07/12/2025 (Originally 12/24/2002)        Plan:   I have personally reviewed and addressed the Medicare Annual Wellness questionnaire and have noted the following in the patient's chart:  A. Medical and social history B. Use of alcohol, tobacco or illicit drugs  C. Current medications and supplements D. Functional ability and status E.  Nutritional status F.  Physical activity G. Advance directives H. List of other physicians I.  Hospitalizations, surgeries, and ER visits in previous 12 months J.  [place] to include hearing, vision, cognitive, depression L. Referrals and appointments - none  In addition, I have reviewed and discussed with patient certain preventive protocols, quality metrics, and best practice recommendations. A written personalized care plan for preventive services as well as general preventive health recommendations  were provided to patient.  See attached scanned questionnaire for additional information.   Signed,   Lindell Noe, MHA, BS, LPN Health Coach

## 2016-06-18 NOTE — Progress Notes (Signed)
Pre visit review using our clinic review tool, if applicable. No additional management support is needed unless otherwise documented below in the visit note. 

## 2016-06-22 NOTE — Progress Notes (Signed)
I reviewed health advisor's note, was available for consultation, and agree with documentation and plan.  

## 2016-06-24 ENCOUNTER — Encounter: Payer: Self-pay | Admitting: Primary Care

## 2016-06-24 ENCOUNTER — Ambulatory Visit (INDEPENDENT_AMBULATORY_CARE_PROVIDER_SITE_OTHER): Payer: PPO | Admitting: Primary Care

## 2016-06-24 DIAGNOSIS — K219 Gastro-esophageal reflux disease without esophagitis: Secondary | ICD-10-CM | POA: Diagnosis not present

## 2016-06-24 DIAGNOSIS — R296 Repeated falls: Secondary | ICD-10-CM | POA: Insufficient documentation

## 2016-06-24 DIAGNOSIS — E782 Mixed hyperlipidemia: Secondary | ICD-10-CM | POA: Diagnosis not present

## 2016-06-24 DIAGNOSIS — G8929 Other chronic pain: Secondary | ICD-10-CM | POA: Diagnosis not present

## 2016-06-24 DIAGNOSIS — I10 Essential (primary) hypertension: Secondary | ICD-10-CM | POA: Diagnosis not present

## 2016-06-24 DIAGNOSIS — M25511 Pain in right shoulder: Secondary | ICD-10-CM | POA: Diagnosis not present

## 2016-06-24 NOTE — Assessment & Plan Note (Signed)
Stable in the office today, continue ACE and Beta Blocker. Discussed to limit greasy/fatty foods, increase vegetables.

## 2016-06-24 NOTE — Assessment & Plan Note (Signed)
Intermittent, worse in AM, works itself out in the afternoon. Xray in July 2017 with arthritis. Overall this doesn't bother him much. Offered PT/Sports Med eval, he kindly declines. Will continue to monitor, exam today without decrease in ROM or change in strength.

## 2016-06-24 NOTE — Patient Instructions (Addendum)
It's important to improve your diet by reducing consumption of fried food, processed snack foods, fatty foods, candy, sugary drinks. Increase consumption of fresh vegetables and fruits, whole grains, water.  Ensure you are drinking 64 ounces of water daily.  Continue exercising. You should be getting 150 minutes of moderate intensity exercise weekly.  You can take tylenol arthritis as needed for episodes of shoulder pain. Try applying a heating pad or ice to the shoulder.   Follow up in 1 year for annual exam or sooner if needed.  It was a pleasure to see you today!   Tips to Avoid Falls:  It is important to avoid accidents which may result in broken bones.  Here are a few ideas on how to make your home safer so you will be less likely to trip or fall.  1. Use nonskid mats or non slip strips in your shower or tub, on your bathroom floor and around sinks.  If you know that you have spilled water, wipe it up! 2. In the bathroom, it is important to have properly installed grab bars on the walls or on the edge of the tub.  Towel racks are NOT strong enough for you to hold onto or to pull on for support. 3. Stairs and hallways should have enough light.  Add lamps or night lights if you need ore light. 4. It is good to have handrails on both sides of the stairs if possible.  Always fix broken handrails right away. 5. It is important to see the edges of steps.  Paint the edges of outdoor steps white so you can see them better.  Put colored tape on the edge of inside steps. 6. Throw-rugs are dangerous because they can slide.  Removing the rugs is the best idea, but if they must stay, add adhesive carpet tape to prevent slipping. 7. Do not keep things on stairs or in the halls.  Remove small furniture that blocks the halls as it may cause you to trip.  Keep telephone and electrical cords out of the way where you walk. 8. Always were sturdy, rubber-soled shoes for good support.  Never wear just socks,  especially on the stairs.  Socks may cause you to slip or fall.  Do not wear full-length housecoats as you can easily trip on the bottom.  9. Place the things you use the most on the shelves that are the easiest to reach.  If you use a stepstool, make sure it is in good condition.  If you feel unsteady, DO NOT climb, ask for help. 10. If a health professional advises you to use a cane or walker, do not be ashamed.  These items can keep you from falling and breaking your bones.

## 2016-06-24 NOTE — Assessment & Plan Note (Signed)
Recent lipid panel unremarkable, continue statin. LFT's stable.

## 2016-06-24 NOTE — Assessment & Plan Note (Signed)
Symptoms well controlled on Zantac once daily. Discussed trigger foods and what to avoid.

## 2016-06-24 NOTE — Assessment & Plan Note (Signed)
Four falls total in 1 year. Doesn't seem to be balance issue, dizziness. Discussed to wear proper shoes and to walk slowly over uneven surfaces. He is active outdoors. Discussed in home safety. Will continue to monitor.

## 2016-06-24 NOTE — Progress Notes (Signed)
Subjective:    Patient ID: [name], male    DOB: October 02, 1935, 81 y.o.   MRN: [id number]  HPI  [name] is a 81 year old male who presents today for MWV part 2. He saw our health advisor on 06/18/16.   1) Recurrent Falls: Fell 4 times total the the past 1 year, three of those falls have been onto his right shoulder. Each of these falls have been from tripping or incorrectly stepping onto uneven pavement. He denies dizziness, weakness, poor balance, chest pain. He does have intermittent right shoulder pain. He doesn't take anything for pain as his pain as this is intermittent. He denies numbness/tingling. Overall his pain isn't bothersome.   2) Essential Hypertension: Currently managed on ramipril 2.5 mg and Lopressor 50 mg BID. His BP in the office today is 136/80. He denies dizziness, chest pain, fatigue.   3) Hyperlipidemia: Currently managed on simvastatin 10 mg. Recent lipid panel stable. He denies myalgias.   Diet currently consists of:  Breakfast: Cereal, fried eggs, bacon, sausage, oatmeal Lunch: Sandwich, applesauce, cheese Dinner: Meat, vegetable, starch, soup Snacks: Candy Desserts: Several times weekly Beverages: Water, soda, occasional sweet tea  Exercise: He is active.    4) GERD: Currently managed on Zantac 150 mg once daily. He denies symptoms when taking this medication.    Review of Systems  Constitutional: Negative for unexpected weight change.  HENT: Negative for rhinorrhea.   Respiratory: Negative for cough and shortness of breath.   Cardiovascular: Negative for chest pain.  Gastrointestinal: Negative for constipation and diarrhea.  Genitourinary: Negative for difficulty urinating.  Musculoskeletal: Positive for arthralgias. Negative for myalgias.       Intermittent right shoulder pain  Skin: Negative for rash.  Allergic/Immunologic: Negative for environmental allergies.  Neurological: Negative for dizziness, numbness and headaches.    Psychiatric/Behavioral:       Denies concerns for anxiety or depression       Past Medical History:  Diagnosis Date  . Coronary artery disease   . Epididymitis    Left  . GERD (gastroesophageal reflux disease)   . Hyperlipidemia   . Hypertension   . MI (myocardial infarction)    first at age 69  . Routine general medical examination at a health care facility   . Skin lesions, generalized      Social History   Social History  . Marital status: Married    Spouse name: N/A  . Number of children: N/A  . Years of education: N/A   Occupational History  . Not on file.   Social History Main Topics  . Smoking status: Former Smoker    Packs/day: 1.00    Years: 30.00    Quit date: 04/14/1986  . Smokeless tobacco: Never Used  . Alcohol use No  . Drug use: No  . Sexual activity: No   Other Topics Concern  . Not on file   Social History Narrative   Married.   6 children, 12 grandchildren, 12 great grandchildren.   Once worked as a Games developer.   Enjoys relaxing, spending time with family.    Past Surgical History:  Procedure Laterality Date  . APPENDECTOMY    . CARDIAC CATHETERIZATION     MI x4 with stents   . CHOLECYSTECTOMY    . HIATAL HERNIA REPAIR    . INGUINAL HERNIA REPAIR    . TONSILLECTOMY      Family History  Problem Relation Age of Onset  . Asthma Mother  No Known Allergies  Current Outpatient Prescriptions on File Prior to Visit  Medication Sig Dispense Refill  . aspirin 325 MG tablet Take 325 mg by mouth daily.     . metoprolol (LOPRESSOR) 50 MG tablet Take 1 tablet (50 mg total) by mouth 2 (two) times daily. 60 tablet 11  . ramipril (ALTACE) 2.5 MG capsule TAKE ONE CAPSULE BY MOUTH ONCE DAILY 90 capsule 3  . ranitidine (ZANTAC) 150 MG capsule Take 150 mg by mouth 2 (two) times daily.      . simvastatin (ZOCOR) 10 MG tablet Take 1 tablet (10 mg total) by mouth at bedtime. 90 tablet 1   No current facility-administered medications on file  prior to visit.     BP 136/80   Pulse 62   Temp 98.2 F (36.8 C) (Oral)   Ht 5' 1.5" (1.562 m)   Wt 125 lb 12.8 oz (57.1 kg)   SpO2 98%   BMI 23.38 kg/m    Objective:   Physical Exam  Constitutional: He is oriented to person, place, and time. He appears well-nourished.  HENT:  Right Ear: Tympanic membrane and ear canal normal.  Left Ear: Tympanic membrane and ear canal normal.  Nose: Nose normal. Right sinus exhibits no maxillary sinus tenderness and no frontal sinus tenderness. Left sinus exhibits no maxillary sinus tenderness and no frontal sinus tenderness.  Mouth/Throat: Oropharynx is clear and moist.  Eyes: Conjunctivae and EOM are normal. Pupils are equal, round, and reactive to light.  Neck: Neck supple. Carotid bruit is not present. No thyromegaly present.  Cardiovascular: Normal rate, regular rhythm and normal heart sounds.   Pulmonary/Chest: Effort normal and breath sounds normal. He has no wheezes. He has no rales.  Abdominal: Soft. Bowel sounds are normal. There is no tenderness.  Musculoskeletal: Normal range of motion.       Right shoulder: He exhibits normal range of motion, no pain and normal strength.  Neurological: He is alert and oriented to person, place, and time. He has normal reflexes. No cranial nerve deficit.  Skin: Skin is warm and dry.  Psychiatric: He has a normal mood and affect.          Assessment & Plan:

## 2016-06-24 NOTE — Progress Notes (Signed)
Pre visit review using our clinic review tool, if applicable. No additional management support is needed unless otherwise documented below in the visit note. 

## 2016-09-26 ENCOUNTER — Encounter: Payer: Self-pay | Admitting: Internal Medicine

## 2016-09-26 ENCOUNTER — Ambulatory Visit (INDEPENDENT_AMBULATORY_CARE_PROVIDER_SITE_OTHER): Payer: PPO | Admitting: Internal Medicine

## 2016-09-26 VITALS — BP 138/72 | HR 57 | Ht 61.5 in | Wt 122.8 lb

## 2016-09-26 DIAGNOSIS — I10 Essential (primary) hypertension: Secondary | ICD-10-CM

## 2016-09-26 DIAGNOSIS — I25119 Atherosclerotic heart disease of native coronary artery with unspecified angina pectoris: Secondary | ICD-10-CM

## 2016-09-26 MED ORDER — RAMIPRIL 2.5 MG PO CAPS: 2.5000 mg | ORAL_CAPSULE | Freq: Every day | ORAL | 3 refills | Status: DC

## 2016-09-26 MED ORDER — SIMVASTATIN 10 MG PO TABS: 10.0000 mg | ORAL_TABLET | Freq: Every day | ORAL | 1 refills | Status: DC

## 2016-09-26 MED ORDER — METOPROLOL TARTRATE 50 MG PO TABS: 50.0000 mg | ORAL_TABLET | Freq: Two times a day (BID) | ORAL | 11 refills | Status: DC

## 2016-09-26 NOTE — Patient Instructions (Addendum)
Medication Instructions:    Your physician recommends that you continue on your current medications as directed. Please refer to the Current Medication list given to you today.  - If you need a refill on your cardiac medications before your next appointment, please call your pharmacy.   Labwork:  None ordered  Testing/Procedures:  None ordered  Follow-Up:  Your physician wants you to follow-up in: 1 year with Dr. Taylor.  You will receive a reminder letter in the mail two months in advance. If you don't receive a letter, please call our office to schedule the follow-up appointment.  Thank you for choosing [facility]!!          

## 2016-09-26 NOTE — Progress Notes (Signed)
HPI [name] returns today for followup. He is a pleasant 81 yo man with a h/o CAD, s/p MI, dyslipidemia and baseline RBBB. In the interim, he has done well. He notes that he gets tired when working, but denies chest pain or shortness of breath. No syncope. No peripheral edema. He has not been in the hospital. 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 He is active in his yard. No chest pain. No Known Allergies   Current Outpatient Prescriptions  Medication Sig Dispense Refill  . aspirin 325 MG tablet Take 325 mg by mouth daily.     . metoprolol (LOPRESSOR) 50 MG tablet Take 1 tablet (50 mg total) by mouth 2 (two) times daily. 60 tablet 11  . ramipril (ALTACE) 2.5 MG capsule TAKE ONE CAPSULE BY MOUTH ONCE DAILY 90 capsule 3  . ranitidine (ZANTAC) 150 MG capsule Take 150 mg by mouth 2 (two) times daily.      . simvastatin (ZOCOR) 10 MG tablet Take 1 tablet (10 mg total) by mouth at bedtime. 90 tablet 1   No current facility-administered medications for this visit.      Past Medical History:  Diagnosis Date  . Coronary artery disease   . Epididymitis    Left  . GERD (gastroesophageal reflux disease)   . Hyperlipidemia   . Hypertension   . MI (myocardial infarction) ([place])    first at age 49  . Routine general medical examination at a health care facility   . Skin lesions, generalized     ROS:   All systems reviewed and negative except as noted in the HPI.   Past Surgical History:  Procedure Laterality Date  . APPENDECTOMY    . CARDIAC CATHETERIZATION     MI x4 with stents   . CHOLECYSTECTOMY    . HIATAL HERNIA REPAIR    . INGUINAL HERNIA REPAIR    . TONSILLECTOMY       Family History  Problem Relation Age of Onset  . Asthma Mother      Social History   Social History  . Marital status: Married    Spouse name: N/A  . Number of children: N/A  . Years of education: N/A   Occupational History  . Not on  file.   Social History Main Topics  . Smoking status: Former Smoker    Packs/day: 1.00    Years: 30.00    Quit date: 04/14/1986  . Smokeless tobacco: Never Used  . Alcohol use No  . Drug use: No  . Sexual activity: No   Other Topics Concern  . Not on file   Social History Narrative   Married.   6 children, 12 grandchildren, 12 great grandchildren.   Once worked as a Games developer.   Enjoys relaxing, spending time with family.     BP 138/72   Pulse (!) 57   Ht 5' 1.5" (1.562 m)   Wt 122 lb 12.8 oz (55.7 kg)   BMI 22.83 kg/m   Physical Exam:  Well appearing 81 year old man,NAD HEENT: Unremarkable Neck:  6 cm JVD, no thyromegally Back:  No CVA tenderness Lungs:  Clear with no wheezes, rales, or rhonchi. HEART:  Regular rate rhythm, no murmurs, no rubs, no clicks Abd:  soft, positive bowel sounds, no organomegally, no rebound, no guarding Ext:  2 plus pulses, no edema, no cyanosis, no clubbing Skin:  No rashes no nodules Neuro:  CN II through  XII intact, motor grossly intact  EKG - sinus bradycardia with right bundle branch block  Assess/Plan: 1. CAD - he denies anginal symptoms. He will continue his current meds. I asked him to call if he develops worsening symptoms. 2. Dyslipidemia - he will continue his statin therapy. He is not overweight.  3. RBBB - his is asymptomatic.  Mikle Bosworth.D.

## 2016-09-29 NOTE — Addendum Note (Signed)
Addended by: Patterson Hammersmith A on: 09/29/2016 12:42 PM   Modules accepted: Orders

## 2016-10-10 ENCOUNTER — Ambulatory Visit: Payer: PPO | Admitting: Internal Medicine

## 2017-04-11 ENCOUNTER — Other Ambulatory Visit: Payer: Self-pay | Admitting: Internal Medicine

## 2017-07-30 DIAGNOSIS — Z7982 Long term (current) use of aspirin: Secondary | ICD-10-CM | POA: Diagnosis not present

## 2017-07-30 DIAGNOSIS — E785 Hyperlipidemia, unspecified: Secondary | ICD-10-CM | POA: Diagnosis not present

## 2017-07-30 DIAGNOSIS — I2510 Atherosclerotic heart disease of native coronary artery without angina pectoris: Secondary | ICD-10-CM | POA: Diagnosis not present

## 2017-07-30 DIAGNOSIS — I951 Orthostatic hypotension: Secondary | ICD-10-CM | POA: Diagnosis not present

## 2017-07-30 DIAGNOSIS — K219 Gastro-esophageal reflux disease without esophagitis: Secondary | ICD-10-CM | POA: Diagnosis not present

## 2017-07-30 DIAGNOSIS — N529 Male erectile dysfunction, unspecified: Secondary | ICD-10-CM | POA: Diagnosis not present

## 2017-07-30 DIAGNOSIS — Z87891 Personal history of nicotine dependence: Secondary | ICD-10-CM | POA: Diagnosis not present

## 2017-07-30 DIAGNOSIS — I252 Old myocardial infarction: Secondary | ICD-10-CM | POA: Diagnosis not present

## 2017-07-30 DIAGNOSIS — Z85828 Personal history of other malignant neoplasm of skin: Secondary | ICD-10-CM | POA: Diagnosis not present

## 2017-07-30 DIAGNOSIS — I10 Essential (primary) hypertension: Secondary | ICD-10-CM | POA: Diagnosis not present

## 2017-09-11 ENCOUNTER — Other Ambulatory Visit: Payer: Self-pay | Admitting: Primary Care

## 2017-09-11 DIAGNOSIS — I10 Essential (primary) hypertension: Secondary | ICD-10-CM

## 2017-09-11 DIAGNOSIS — E782 Mixed hyperlipidemia: Secondary | ICD-10-CM

## 2017-09-14 ENCOUNTER — Ambulatory Visit (INDEPENDENT_AMBULATORY_CARE_PROVIDER_SITE_OTHER): Payer: Medicare HMO

## 2017-09-14 VITALS — BP 140/80 | HR 63 | Temp 97.6°F | Ht 62.0 in | Wt 117.8 lb

## 2017-09-14 DIAGNOSIS — I10 Essential (primary) hypertension: Secondary | ICD-10-CM

## 2017-09-14 DIAGNOSIS — Z0000 Encounter for general adult medical examination without abnormal findings: Secondary | ICD-10-CM | POA: Diagnosis not present

## 2017-09-14 DIAGNOSIS — E782 Mixed hyperlipidemia: Secondary | ICD-10-CM | POA: Diagnosis not present

## 2017-09-14 LAB — COMPREHENSIVE METABOLIC PANEL
ALT: 14 U/L (ref 0–53)
AST: 19 U/L (ref 0–37)
Albumin: 4.1 g/dL (ref 3.5–5.2)
Alkaline Phosphatase: 85 U/L (ref 39–117)
BUN: 15 mg/dL (ref 6–23)
CHLORIDE: 105 meq/L (ref 96–112)
CO2: 31 meq/L (ref 19–32)
Calcium: 9.3 mg/dL (ref 8.4–10.5)
Creatinine, Ser: 0.9 mg/dL (ref 0.40–1.50)
GFR: 86.36 mL/min (ref >60.00)
GLUCOSE: 100 mg/dL — AB (ref 70–99)
POTASSIUM: 4.6 meq/L (ref 3.5–5.1)
Sodium: 141 mEq/L (ref 135–145)
Total Bilirubin: 0.5 mg/dL (ref 0.2–1.2)
Total Protein: 6.9 g/dL (ref 6.0–8.3)

## 2017-09-14 LAB — LIPID PANEL
CHOLESTEROL: 113 mg/dL (ref 0–200)
HDL: 42 mg/dL (ref >39.00)
LDL CALC: 58 mg/dL (ref 0–99)
NonHDL: 70.9
TRIGLYCERIDES: 67 mg/dL (ref 0.0–149.0)
Total CHOL/HDL Ratio: 3
VLDL: 13.4 mg/dL (ref 0.0–40.0)

## 2017-09-14 NOTE — Progress Notes (Signed)
PCP notes:   Health maintenance:  No gaps identified.  Abnormal screenings:   Hearing - failed  Hearing Screening   125Hz  250Hz  500Hz  1000Hz  2000Hz  3000Hz  4000Hz  6000Hz  8000Hz   Right ear:   40 40 40  0    Left ear:   40 40 40  0     Mini-Cog score: 17/20 MMSE - Mini Mental State Exam 09/14/2017 06/18/2016  Orientation to time 5 5  Orientation to Place 5 5  Registration 3 3  Attention/ Calculation 0 0  Recall 0 0  Recall-comments unable to recall 3 of 3 woreds pt was unable to recall 3 of 3 words  Language- name 2 objects 0 0  Language- repeat 1 1  Language- follow 3 step command 3 3  Language- read & follow direction 0 0  Write a sentence 0 0  Copy design 0 0  Total score 17 17    Patient concerns:   None  Nurse concerns:  None  Next PCP appt:   09/22/2017 @ 1400

## 2017-09-14 NOTE — Progress Notes (Signed)
Subjective:   [name] is a 82 y.o. male who presents for Medicare Annual/Subsequent preventive examination.  Review of Systems:  N/A Cardiac Risk Factors include: advanced age (>55men, >78 women);male gender;dyslipidemia;hypertension     Objective:    Vitals: BP 140/80 (BP Location: Left Arm, Patient Position: Sitting, Cuff Size: Normal)   Pulse 63   Temp 97.6 F (36.4 C) (Oral)   Ht 5\' 2"  (1.575 m) Comment: shoes  Wt 117 lb 12 oz (53.4 kg)   SpO2 94%   BMI 21.54 kg/m   Body mass index is 21.54 kg/m.  Advanced Directives 09/14/2017 06/18/2016  Does Patient Have a Medical Advance Directive? No No  Would patient like information on creating a medical advance directive? No - Patient declined -    Tobacco Social History   Tobacco Use  Smoking Status Former Smoker  . Packs/day: 1.00  . Years: 30.00  . Pack years: 30.00  . Last attempt to quit: 04/14/1986  . Years since quitting: 31.4  Smokeless Tobacco Never Used     Counseling given: No   Clinical Intake:  Pre-visit preparation completed: Yes  Pain Score: 2      Nutritional Status: BMI of 19-24  Normal Nutritional Risks: None Diabetes: No  How often do you need to have someone help you when you read instructions, pamphlets, or other written materials from your doctor or pharmacy?: 1 - Never What is the last grade level you completed in school?: 9th grade  Interpreter Needed?: No  Comments: pt lives with spouse Information entered by :: LPinson, LPN  Past Medical History:  Diagnosis Date  . Coronary artery disease   . Epididymitis    Left  . GERD (gastroesophageal reflux disease)   . Hyperlipidemia   . Hypertension   . MI (myocardial infarction) ([place])    first at age 77  . Routine general medical examination at a health care facility   . Skin lesions, generalized    Past Surgical History:  Procedure Laterality Date  . APPENDECTOMY    . CARDIAC CATHETERIZATION     MI x4 with stents   .  CHOLECYSTECTOMY    . HIATAL HERNIA REPAIR    . INGUINAL HERNIA REPAIR    . TONSILLECTOMY     Family History  Problem Relation Age of Onset  . Asthma Mother    Social History   Socioeconomic History  . Marital status: Married    Spouse name: Not on file  . Number of children: Not on file  . Years of education: Not on file  . Highest education level: Not on file  Occupational History  . Not on file  Social Needs  . Financial resource strain: Not on file  . Food insecurity:    Worry: Not on file    Inability: Not on file  . Transportation needs:    Medical: Not on file    Non-medical: Not on file  Tobacco Use  . Smoking status: Former Smoker    Packs/day: 1.00    Years: 30.00    Pack years: 30.00    Last attempt to quit: 04/14/1986    Years since quitting: 31.4  . Smokeless tobacco: Never Used  Substance and Sexual Activity  . Alcohol use: No  . Drug use: No  . Sexual activity: Never  Lifestyle  . Physical activity:    Days per week: Not on file    Minutes per session: Not on file  . Stress: Not on  file  Relationships  . Social connections:    Talks on phone: Not on file    Gets together: Not on file    Attends religious service: Not on file    Active member of club or organization: Not on file    Attends meetings of clubs or organizations: Not on file    Relationship status: Not on file  Other Topics Concern  . Not on file  Social History Narrative   Married.   6 children, 12 grandchildren, 12 great grandchildren.   Once worked as a Games developer.   Enjoys relaxing, spending time with family.    Outpatient Encounter Medications as of 09/14/2017  Medication Sig  . aspirin 325 MG tablet Take 325 mg by mouth daily.   . metoprolol tartrate (LOPRESSOR) 50 MG tablet Take 1 tablet (50 mg total) by mouth 2 (two) times daily.  . ramipril (ALTACE) 2.5 MG capsule Take 1 capsule (2.5 mg total) by mouth daily.  . ranitidine (ZANTAC) 150 MG capsule Take 150 mg by mouth 2  (two) times daily.    . simvastatin (ZOCOR) 10 MG tablet TAKE 1 TABLET BY MOUTH AT BEDTIME   No facility-administered encounter medications on file as of 09/14/2017.     Activities of Daily Living In your present state of health, do you have any difficulty performing the following activities: 09/14/2017  Hearing? Y  Vision? N  Difficulty concentrating or making decisions? Y  Walking or climbing stairs? N  Dressing or bathing? N  Doing errands, shopping? Y  Preparing Food and eating ? N  Using the Toilet? N  In the past six months, have you accidently leaked urine? N  Do you have problems with loss of bowel control? N  Managing your Medications? N  Managing your Finances? N  Housekeeping or managing your Housekeeping? N  Some recent data might be hidden    Patient Care Team: Pleas Koch, NP as PCP - General (Internal Medicine)   Assessment:   This is a routine wellness examination for [place].  Exercise Activities and Dietary recommendations Current Exercise Habits: Home exercise routine, Type of exercise: walking, Time (Minutes): > 60, Frequency (Times/Week): 7, Weekly Exercise (Minutes/Week): 0, Exercise limited by: None identified  Goals    . Patient Stated     Staring 09/14/2017, I will continue to walk at least 60 minutes daily.        Fall Risk Fall Risk  09/14/2017 06/18/2016  Falls in the past year? No Yes  Comment - pt fell and hurt right shoulder  Number falls in past yr: - 2 or more  Injury with Fall? - Yes   Depression Screen PHQ 2/9 Scores 09/14/2017 06/18/2016  PHQ - 2 Score 0 0  PHQ- 9 Score 0 -    Cognitive Function MMSE - Mini Mental State Exam 09/14/2017 06/18/2016  Orientation to time 5 5  Orientation to Place 5 5  Registration 3 3  Attention/ Calculation 0 0  Recall 0 0  Recall-comments unable to recall 3 of 3 woreds pt was unable to recall 3 of 3 words  Language- name 2 objects 0 0  Language- repeat 1 1  Language- follow 3 step command 3 3    Language- read & follow direction 0 0  Write a sentence 0 0  Copy design 0 0  Total score 17 17    Screening Tests Health Maintenance  Topic Date Due  . INFLUENZA VACCINE  07/12/2025 (Originally 11/12/2017)  . TETANUS/TDAP  07/12/2025 (Originally 12/23/1954)  . PNA vac Low Risk Adult (1 of 2 - PCV13) 07/12/2025 (Originally 12/24/2002)       Plan:     I have personally reviewed, addressed, and noted the following in the patient's chart:  A. Medical and social history B. Use of alcohol, tobacco or illicit drugs  C. Current medications and supplements D. Functional ability and status E.  Nutritional status F.  Physical activity G. Advance directives H. List of other physicians I.  Hospitalizations, surgeries, and ER visits in previous 12 months J.  [place] to include hearing, vision, cognitive, depression L. Referrals and appointments - none  In addition, I have reviewed and discussed with patient certain preventive protocols, quality metrics, and best practice recommendations. A written personalized care plan for preventive services as well as general preventive health recommendations were provided to patient.  See attached scanned questionnaire for additional information.   Signed,   Lindell Noe, MHA, BS, LPN Health Coach

## 2017-09-14 NOTE — Patient Instructions (Signed)
[name] , Thank you for taking time to come for your Medicare Wellness Visit. I appreciate your ongoing commitment to your health goals. Please review the following plan we discussed and let me know if I can assist you in the future.   These are the goals we discussed: Goals    . Patient Stated     [place] 09/14/2017, I will continue to walk at least 60 minutes daily.        This is a list of the screening recommended for you and due dates:  Health Maintenance  Topic Date Due  . Flu Shot  07/12/2025*  . Tetanus Vaccine  07/12/2025*  . Pneumonia vaccines (1 of 2 - PCV13) 07/12/2025*  *Topic was postponed. The date shown is not the original due date.   Preventive Care for Adults  A healthy lifestyle and preventive care can promote health and wellness. Preventive health guidelines for adults include the following key practices.  . A routine yearly physical is a good way to check with your health care provider about your health and preventive screening. It is a chance to share any concerns and updates on your health and to receive a thorough exam.  . Visit your dentist for a routine exam and preventive care every 6 months. Brush your teeth twice a day and floss once a day. Good oral hygiene prevents tooth decay and gum disease.  . The frequency of eye exams is based on your age, health, family medical history, use  of contact lenses, and other factors. Follow your health care provider's recommendations for frequency of eye exams.  . Eat a healthy diet. Foods like vegetables, fruits, whole grains, low-fat dairy products, and lean protein foods contain the nutrients you need without too many calories. Decrease your intake of foods high in solid fats, added sugars, and salt. Eat the right amount of calories for you. Get information about a proper diet from your health care provider, if necessary.  . Regular physical exercise is one of the most important things you can do for your  health. Most adults should get at least 150 minutes of moderate-intensity exercise (any activity that increases your heart rate and causes you to sweat) each week. In addition, most adults need muscle-strengthening exercises on 2 or more days a week.  Silver Sneakers may be a benefit available to you. To determine eligibility, you may visit the website: www.silversneakers.com or contact program at 518-234-1897 Mon-Fri between 8AM-8PM.   . Maintain a healthy weight. The body mass index (BMI) is a screening tool to identify possible weight problems. It provides an estimate of body fat based on height and weight. Your health care provider can find your BMI and can help you achieve or maintain a healthy weight.   For adults 20 years and older: ? A BMI below 18.5 is considered underweight. ? A BMI of 18.5 to 24.9 is normal. ? A BMI of 25 to 29.9 is considered overweight. ? A BMI of 30 and above is considered obese.   . Maintain normal blood lipids and cholesterol levels by exercising and minimizing your intake of saturated fat. Eat a balanced diet with plenty of fruit and vegetables. Blood tests for lipids and cholesterol should begin at age 8 and be repeated every 5 years. If your lipid or cholesterol levels are high, you are over 50, or you are at high risk for heart disease, you may need your cholesterol levels checked more frequently. Ongoing high lipid  and cholesterol levels should be treated with medicines if diet and exercise are not working.  . If you smoke, find out from your health care provider how to quit. If you do not use tobacco, please do not start.  . If you choose to drink alcohol, please do not consume more than 2 drinks per day. One drink is considered to be 12 ounces (355 mL) of beer, 5 ounces (148 mL) of wine, or 1.5 ounces (44 mL) of liquor.  . If you are 68-53 years old, ask your health care provider if you should take aspirin to prevent strokes.  . Use sunscreen. Apply  sunscreen liberally and repeatedly throughout the day. You should seek shade when your shadow is shorter than you. Protect yourself by wearing long sleeves, pants, a wide-brimmed hat, and sunglasses year round, whenever you are outdoors.  . Once a month, do a whole body skin exam, using a mirror to look at the skin on your back. Tell your health care provider of new moles, moles that have irregular borders, moles that are larger than a pencil eraser, or moles that have changed in shape or color.

## 2017-09-18 NOTE — Progress Notes (Signed)
I reviewed health advisor's note, was available for consultation, and agree with documentation and plan.  

## 2017-09-22 ENCOUNTER — Encounter: Payer: Self-pay | Admitting: Primary Care

## 2017-09-22 ENCOUNTER — Ambulatory Visit (INDEPENDENT_AMBULATORY_CARE_PROVIDER_SITE_OTHER): Payer: Medicare HMO | Admitting: Primary Care

## 2017-09-22 VITALS — BP 140/70 | HR 60 | Temp 97.7°F | Ht 62.0 in | Wt 117.8 lb

## 2017-09-22 DIAGNOSIS — E782 Mixed hyperlipidemia: Secondary | ICD-10-CM | POA: Diagnosis not present

## 2017-09-22 DIAGNOSIS — I252 Old myocardial infarction: Secondary | ICD-10-CM

## 2017-09-22 DIAGNOSIS — R296 Repeated falls: Secondary | ICD-10-CM | POA: Diagnosis not present

## 2017-09-22 DIAGNOSIS — I259 Chronic ischemic heart disease, unspecified: Secondary | ICD-10-CM | POA: Diagnosis not present

## 2017-09-22 DIAGNOSIS — Z23 Encounter for immunization: Secondary | ICD-10-CM

## 2017-09-22 DIAGNOSIS — Z0001 Encounter for general adult medical examination with abnormal findings: Secondary | ICD-10-CM

## 2017-09-22 DIAGNOSIS — I10 Essential (primary) hypertension: Secondary | ICD-10-CM | POA: Diagnosis not present

## 2017-09-22 DIAGNOSIS — K219 Gastro-esophageal reflux disease without esophagitis: Secondary | ICD-10-CM

## 2017-09-22 MED ORDER — ZOSTER VAC RECOMB ADJUVANTED 50 MCG/0.5ML IM SUSR: 0.5000 mL | Freq: Once | INTRAMUSCULAR | 1 refills | Status: AC

## 2017-09-22 NOTE — Patient Instructions (Signed)
You were provided with a pneumonia vaccination today.  Take the shingles shot to the pharmacy in one month. You will get the second shot within 2-6 months after the first.  Continue exercising. You should be getting 150 minutes of moderate intensity exercise weekly.  Increase consumption of vegetables, fruit, whole grains, lean protein.  Ensure you are consuming 64 ounces of water daily.  Follow up with Dr. Lovena Le as discussed.   Follow up in 1 year for your annual exam.  It was a pleasure to see you today!   Preventive Care 80 Years and Older, Male Preventive care refers to lifestyle choices and visits with your health care provider that can promote health and wellness. What does preventive care include?  A yearly physical exam. This is also called an annual well check.  Dental exams once or twice a year.  Routine eye exams. Ask your health care provider how often you should have your eyes checked.  Personal lifestyle choices, including: ? Daily care of your teeth and gums. ? Regular physical activity. ? Eating a healthy diet. ? Avoiding tobacco and drug use. ? Limiting alcohol use. ? Practicing safe sex. ? Taking low doses of aspirin every day. ? Taking vitamin and mineral supplements as recommended by your health care provider. What happens during an annual well check? The services and screenings done by your health care provider during your annual well check will depend on your age, overall health, lifestyle risk factors, and family history of disease. Counseling Your health care provider may ask you questions about your:  Alcohol use.  Tobacco use.  Drug use.  Emotional well-being.  Home and relationship well-being.  Sexual activity.  Eating habits.  History of falls.  Memory and ability to understand (cognition).  Work and work Statistician.  Screening You may have the following tests or measurements:  Height, weight, and BMI.  Blood  pressure.  Lipid and cholesterol levels. These may be checked every 5 years, or more frequently if you are over 80 years old.  Skin check.  Lung cancer screening. You may have this screening every year starting at age 71 if you have a 30-pack-year history of smoking and currently smoke or have quit within the past 15 years.  Fecal occult blood test (FOBT) of the stool. You may have this test every year starting at age 54.  Flexible sigmoidoscopy or colonoscopy. You may have a sigmoidoscopy every 5 years or a colonoscopy every 10 years starting at age 22.  Prostate cancer screening. Recommendations will vary depending on your family history and other risks.  Hepatitis C blood test.  Hepatitis B blood test.  Sexually transmitted disease (STD) testing.  Diabetes screening. This is done by checking your blood sugar (glucose) after you have not eaten for a while (fasting). You may have this done every 1-3 years.  Abdominal aortic aneurysm (AAA) screening. You may need this if you are a current or former smoker.  Osteoporosis. You may be screened starting at age 51 if you are at high risk.  Talk with your health care provider about your test results, treatment options, and if necessary, the need for more tests. Vaccines Your health care provider may recommend certain vaccines, such as:  Influenza vaccine. This is recommended every year.  Tetanus, diphtheria, and acellular pertussis (Tdap, Td) vaccine. You may need a Td booster every 10 years.  Varicella vaccine. You may need this if you have not been vaccinated.  Zoster vaccine. You may need  this after age 23.  Measles, mumps, and rubella (MMR) vaccine. You may need at least one dose of MMR if you were born in 1957 or later. You may also need a second dose.  Pneumococcal 13-valent conjugate (PCV13) vaccine. One dose is recommended after age 69.  Pneumococcal polysaccharide (PPSV23) vaccine. One dose is recommended after age  23.  Meningococcal vaccine. You may need this if you have certain conditions.  Hepatitis A vaccine. You may need this if you have certain conditions or if you travel or work in places where you may be exposed to hepatitis A.  Hepatitis B vaccine. You may need this if you have certain conditions or if you travel or work in places where you may be exposed to hepatitis B.  Haemophilus influenzae type b (Hib) vaccine. You may need this if you have certain risk factors.  Talk to your health care provider about which screenings and vaccines you need and how often you need them. This information is not intended to replace advice given to you by your health care provider. Make sure you discuss any questions you have with your health care provider. Document Released: 04/27/2015 Document Revised: 12/19/2015 Document Reviewed: 01/30/2015 Elsevier Interactive Patient Education  [employer].

## 2017-09-22 NOTE — Assessment & Plan Note (Signed)
Following with cardiology. 

## 2017-09-22 NOTE — Assessment & Plan Note (Signed)
Stable in the office today, continue Lopressor and Altace. BMP unremarkable.

## 2017-09-22 NOTE — Progress Notes (Signed)
Subjective:    Patient ID: [name], male    DOB: 04-18-1935, 82 y.o.   MRN: [id number]  HPI  [name] is a 82 year old male who presents today for complete physical. He saw our health advisor last week.  BP Readings from Last 3 Encounters:  09/22/17 140/70  09/14/17 140/80  09/26/16 138/72     Immunizations: -Pneumonia: Never completed -Shingles: Never completed  Diet: He endorses a poor diet Breakfast: Berniece Salines, eggs, croissant, cereal, fruit Lunch: Sandwich, left overs  Dinner: Meat, vegetables, starch, beans Snacks: Honey Buns, donuts, cookies, candy Desserts: Daily Beverages: Most water, sometimes small amount of Coke, milk, sweet tea, orange juice  Exercise: He is active, he does not exercise Eye exam: Completed over 1 year ago Dental exam: No recent exam Colonoscopy: Completed several years ago, normal.  PSA: Negative in 2018   Review of Systems  Constitutional: Negative for unexpected weight change.  HENT: Negative for rhinorrhea.   Respiratory: Negative for cough and shortness of breath.   Cardiovascular: Negative for chest pain.  Gastrointestinal: Negative for constipation and diarrhea.  Genitourinary: Negative for difficulty urinating.  Musculoskeletal: Negative for arthralgias and myalgias.  Skin: Negative for rash.  Allergic/Immunologic: Negative for environmental allergies.  Neurological: Negative for dizziness, numbness and headaches.       Past Medical History:  Diagnosis Date  . Coronary artery disease   . Epididymitis    Left  . GERD (gastroesophageal reflux disease)   . Hyperlipidemia   . Hypertension   . MI (myocardial infarction) ([place])    first at age 10  . Routine general medical examination at a health care facility   . Skin lesions, generalized      Social History   Socioeconomic History  . Marital status: Married    Spouse name: Not on file  . Number of children: Not on file  . Years of education: Not on file    . Highest education level: Not on file  Occupational History  . Not on file  Social Needs  . Financial resource strain: Not on file  . Food insecurity:    Worry: Not on file    Inability: Not on file  . Transportation needs:    Medical: Not on file    Non-medical: Not on file  Tobacco Use  . Smoking status: Former Smoker    Packs/day: 1.00    Years: 30.00    Pack years: 30.00    Last attempt to quit: 04/14/1986    Years since quitting: 31.4  . Smokeless tobacco: Never Used  Substance and Sexual Activity  . Alcohol use: No  . Drug use: No  . Sexual activity: Never  Lifestyle  . Physical activity:    Days per week: Not on file    Minutes per session: Not on file  . Stress: Not on file  Relationships  . Social connections:    Talks on phone: Not on file    Gets together: Not on file    Attends religious service: Not on file    Active member of club or organization: Not on file    Attends meetings of clubs or organizations: Not on file    Relationship status: Not on file  . Intimate partner violence:    Fear of current or ex partner: Not on file    Emotionally abused: Not on file    Physically abused: Not on file    Forced sexual activity: Not on file  Other Topics Concern  . Not on file  Social History Narrative   Married.   6 children, 12 grandchildren, 12 great grandchildren.   Once worked as a Games developer.   Enjoys relaxing, spending time with family.    Past Surgical History:  Procedure Laterality Date  . APPENDECTOMY    . CARDIAC CATHETERIZATION     MI x4 with stents   . CHOLECYSTECTOMY    . HIATAL HERNIA REPAIR    . INGUINAL HERNIA REPAIR    . TONSILLECTOMY      Family History  Problem Relation Age of Onset  . Asthma Mother     No Known Allergies  Current Outpatient Medications on File Prior to Visit  Medication Sig Dispense Refill  . aspirin 325 MG tablet Take 325 mg by mouth daily.     . metoprolol tartrate (LOPRESSOR) 50 MG tablet Take 1  tablet (50 mg total) by mouth 2 (two) times daily. 60 tablet 11  . ramipril (ALTACE) 2.5 MG capsule Take 1 capsule (2.5 mg total) by mouth daily. 90 capsule 3  . ranitidine (ZANTAC) 150 MG capsule Take 150 mg by mouth 2 (two) times daily.      . simvastatin (ZOCOR) 10 MG tablet TAKE 1 TABLET BY MOUTH AT BEDTIME 90 tablet 2   No current facility-administered medications on file prior to visit.     BP 140/70   Pulse 60   Temp 97.7 F (36.5 C) (Oral)   Ht 5\' 2"  (1.575 m)   Wt 117 lb 12 oz (53.4 kg)   SpO2 98%   BMI 21.54 kg/m    Objective:   Physical Exam  Constitutional: He is oriented to person, place, and time. He appears well-nourished.  HENT:  Mouth/Throat: No oropharyngeal exudate.  Eyes: Pupils are equal, round, and reactive to light. EOM are normal.  Neck: Neck supple. No thyromegaly present.  Cardiovascular: Normal rate and regular rhythm.  Pulses:      Carotid pulses are on the left side with bruit. Respiratory: Effort normal and breath sounds normal.  GI: Soft. Bowel sounds are normal. There is no tenderness.  Musculoskeletal: Normal range of motion.  Neurological: He is alert and oriented to person, place, and time.  Skin: Skin is warm and dry.  Psychiatric: He has a normal mood and affect.           Assessment & Plan:

## 2017-09-22 NOTE — Addendum Note (Signed)
Addended by: Jacqualin Combes on: 09/22/2017 02:50 PM   Modules accepted: Orders

## 2017-09-22 NOTE — Assessment & Plan Note (Signed)
No symptoms, occasional use of Zantac.

## 2017-09-22 NOTE — Assessment & Plan Note (Signed)
No recent falls 

## 2017-09-22 NOTE — Assessment & Plan Note (Addendum)
Endorses he's never received a pneumonia vaccination, Prevnar 11 provided today. Rx for Shingrix provided with instructions to get in one month if available. PSA is UTD. Colonoscopy UTD per patient. Exam with left carotid bruit, he plans on discussing this with his cardiologist next month. Discussed that if he does not meet with is cardiologist then to notify us and we will proceed with carotid dopplers. Managed on statin and aspirin. Exam otherwise unremarkable. Labs stable. Follow up in 1 year for CPE.

## 2017-09-22 NOTE — Assessment & Plan Note (Addendum)
Compliant to aspirin daily, following with cardiology.

## 2017-09-22 NOTE — Assessment & Plan Note (Signed)
Recent LDL at goal. Continue Simvastatin 10 mg.

## 2017-10-11 ENCOUNTER — Other Ambulatory Visit: Payer: Self-pay | Admitting: Internal Medicine

## 2017-10-12 ENCOUNTER — Other Ambulatory Visit: Payer: Self-pay

## 2017-10-12 MED ORDER — METOPROLOL TARTRATE 50 MG PO TABS: 50.0000 mg | ORAL_TABLET | Freq: Two times a day (BID) | ORAL | 0 refills | Status: DC

## 2017-11-15 ENCOUNTER — Other Ambulatory Visit: Payer: Self-pay | Admitting: Internal Medicine

## 2017-11-18 ENCOUNTER — Telehealth: Payer: Self-pay | Admitting: Internal Medicine

## 2017-11-18 MED ORDER — METOPROLOL TARTRATE 50 MG PO TABS: 50.0000 mg | ORAL_TABLET | Freq: Two times a day (BID) | ORAL | 0 refills | Status: DC

## 2017-11-18 MED ORDER — RAMIPRIL 2.5 MG PO CAPS: 2.5000 mg | ORAL_CAPSULE | Freq: Every day | ORAL | 0 refills | Status: DC

## 2017-11-18 MED ORDER — SIMVASTATIN 10 MG PO TABS: 10.0000 mg | ORAL_TABLET | Freq: Every day | ORAL | 0 refills | Status: DC

## 2017-11-18 NOTE — Telephone Encounter (Signed)
Pt's medications were sent to pt's pharmacy as requested. Confirmation received.  

## 2017-11-18 NOTE — Telephone Encounter (Signed)
New Message     *STAT* If patient is at the pharmacy, call can be transferred to refill team.   1. Which medications need to be refilled? (please list name of each medication and dose if known) metoprolol tartrate (LOPRESSOR) 50 MG tablet,  ramipril (ALTACE) 2.5 MG capsule and simvastatin (ZOCOR) 10 MG tablet   2. Which pharmacy/location (including street and city if local pharmacy) is medication to be sent to? [place], [place] - [address]  3. Do they need a 30 day or 90 day supply? [place]

## 2017-12-17 ENCOUNTER — Ambulatory Visit: Payer: Medicare HMO | Admitting: Physician Assistant

## 2018-01-23 NOTE — Progress Notes (Signed)
Cardiology Office Note Date:  01/26/2018  Patient ID:  [name], DOB 1935-04-26, MRN [id number] PCP:  Pleas Koch, NP  Electrophysiologist/cardiologist:  Dr. Lovena Le   Chief Complaint: annual EP visit  History of Present Illness: [name] is a 82 y.o. male with history of CAD (prior MI, very remote PCIs), HTN, HLD, RBBB.  He comes in today to be seen for Dr. Lovena Le.  Last seen by him in June 2018.  At that time, doing well, mentioning fatigued with working, no changes were made to his therapy.  He is doing very well.  Continues to be very active, takes care ofhis property, continues to split wood, despite his wife's objections!  He denies any kind of cardiac awareness.  No CP, palpitations or SOB, No DOE.  No dizziness, near syncope or syncope.  He reports generally eating healthy, infrequently splurges.  Reports medication compliance.  Past Medical History:  Diagnosis Date  . Coronary artery disease   . Epididymitis    Left  . GERD (gastroesophageal reflux disease)   . Hyperlipidemia   . Hypertension   . MI (myocardial infarction) ([place])    first at age 77  . Routine general medical examination at a health care facility   . Skin lesions, generalized     Past Surgical History:  Procedure Laterality Date  . APPENDECTOMY    . CARDIAC CATHETERIZATION     MI x4 with stents   . CHOLECYSTECTOMY    . HIATAL HERNIA REPAIR    . INGUINAL HERNIA REPAIR    . TONSILLECTOMY      Current Outpatient Medications  Medication Sig Dispense Refill  . aspirin 325 MG tablet Take 325 mg by mouth daily.     . metoprolol tartrate (LOPRESSOR) 50 MG tablet Take 1 tablet (50 mg total) by mouth 2 (two) times daily. Please keep upcoming appt in September before anymore refills. Thank you 180 tablet 0  . ramipril (ALTACE) 2.5 MG capsule Take 1 capsule (2.5 mg total) by mouth daily. Please keep upcoming appt in September before anymore refills. Thank you 90 capsule 0  .  ranitidine (ZANTAC) 150 MG capsule Take 150 mg by mouth 2 (two) times daily.      . simvastatin (ZOCOR) 10 MG tablet Take 1 tablet (10 mg total) by mouth at bedtime. Please keep upcoming appt in September before anymore refills. Thank you 90 tablet 0   No current facility-administered medications for this visit.     Allergies:   Patient has no known allergies.   Social History:  The patient  reports that he quit smoking about 31 years ago. He has a 30.00 pack-year smoking history. He has never used smokeless tobacco. He reports that he does not drink alcohol or use drugs.   Family History:  The patient's family history includes Asthma in his mother.  ROS:  Please see the history of present illness All other systems are reviewed and otherwise negative.   PHYSICAL EXAM:  VS:  BP (!) 142/70   Pulse 60   Ht 5\' 4"  (1.626 m)   Wt 117 lb (53.1 kg)   BMI 20.08 kg/m  BMI: Body mass index is 20.08 kg/m. Well nourished, well developed, in no acute distress  HEENT: normocephalic, atraumatic  Neck: no JVD, carotid bruits or masses Cardiac:  RRR; no significant murmurs, no rubs, or gallops Lungs:  CTA b/l, no wheezing, rhonchi or rales  Abd: soft, nontender MS: no deformity, age appropriate atrophy  Ext: no edema  Skin: warm and dry, no rash Neuro:  No gross deficits appreciated Psych: euthymic mood, full affect   EKG:  Done today and reviewed by myself SR 60bpm, RBBB, unchanged  03/14/06; LHC Left main coronary artery was free of significant disease.  Left anterior descending artery gave rise to three diagonal branches and  two septal perforators.  There was 70% stenosis in the mid LAD and 50%  stenosis in the distal LAD, just after the third diagonal branch.  There  was 70% ostial stenosis in the first diagonal branch.  The circumflex artery gave rise to a marginal branch and a  posterolateral branch.  These vessels were irregular but there was no  major obstruction.  The right  coronary artery was a large dominant vessel.  It gave rise to  a conus branch, two right ventricle branches, a posterior descending  branch, and three posterolateral branches.  There were stents in the  proximal mid and distal right coronary artery.  There was 30% narrowing  of the proximal stent and less than 20% narrowing in the mid and distal  stents.  There was 40% narrowing in the distal right coronary after the  posterior descending branch.  The left ventriculogram was performed in RAO projection.  It showed hypo  to akinesis of the inferoapical segment.  The overall motion was good  and the estimated fraction was 50%.  CONCLUSION:  Coronary artery disease, status post primary percutaneous  coronary intervention procedures as described above with nonobstructive  coronary disease with 70% mid and 50% distal stenosis in the left  anterior descending artery; 70% stenosis in the ostium of the first  diagonal branch; irregularities in the circumflex artery; 30% narrowing  in the proximal stent and less than 20% narrowing in the mid and distal  stents in the right coronary artery with 40% narrowing in the very  distal right coronary artery; and inferoapical wall akinesis with an  estimated ejection fraction of 55%.  Recent Labs: 09/14/2017: ALT 14; BUN 15; Creatinine, Ser 0.90; Potassium 4.6; Sodium 141  09/14/2017: Cholesterol 113; HDL 42.00; LDL Cholesterol 58; Total CHOL/HDL Ratio 3; Triglycerides 67.0; VLDL 13.4   CrCl cannot be calculated (Patient's most recent lab result is older than the maximum 21 days allowed.).   Wt Readings from Last 3 Encounters:  01/26/18 117 lb (53.1 kg)  09/22/17 117 lb 12 oz (53.4 kg)  09/14/17 117 lb 12 oz (53.4 kg)     Other studies reviewed: Additional studies/records reviewed today include: summarized above  ASSESSMENT AND PLAN:  1. CAD     No symptoms     On ASA, BB, statin tx  He remains on 325mg  daily ASA, recommend reducing to 81mg   daily  2. HTN     Looks good, no changes  3. HLD      Monitored by his PMD, lipids panel in June looked good  Disposition: Will get an echo to update, F/u with Dr. Lovena Le (patient inquires about seeing him) 45mo, sooner if needed  Current medicines are reviewed at length with the patient today.  The patient did not have any concerns regarding medicines.  Venetia Night, PA-C 01/26/2018 9:10 AM     [facility] [address] [place] [address] [phone] (office)  [phone] (fax)

## 2018-01-26 ENCOUNTER — Ambulatory Visit: Payer: Medicare HMO | Admitting: Physician Assistant

## 2018-01-26 VITALS — BP 142/70 | HR 60 | Ht 64.0 in | Wt 117.0 lb

## 2018-01-26 DIAGNOSIS — I10 Essential (primary) hypertension: Secondary | ICD-10-CM | POA: Diagnosis not present

## 2018-01-26 DIAGNOSIS — E785 Hyperlipidemia, unspecified: Secondary | ICD-10-CM

## 2018-01-26 DIAGNOSIS — I2510 Atherosclerotic heart disease of native coronary artery without angina pectoris: Secondary | ICD-10-CM | POA: Diagnosis not present

## 2018-01-26 NOTE — Addendum Note (Signed)
Addended by: Claude Manges on: 01/26/2018 09:21 AM   Modules accepted: Orders

## 2018-01-26 NOTE — Patient Instructions (Addendum)
Medication Instructions:    STOP TAKING ASPIRIN 325 MG   START TAKING ASPIRIN 81 MG ONCE A DAY    If you need a refill on your cardiac medications before your next appointment, please call your pharmacy.  Labwork: NONE ORDERED  TODAY    Testing/Procedures: Your physician has requested that you have an echocardiogram. Echocardiography is a painless test that uses sound waves to create images of your heart. It provides your doctor with information about the size and shape of your heart and how well your heart's chambers and valves are working. This procedure takes approximately one hour. There are no restrictions for this procedure.     Follow-Up: Your physician wants you to follow-up in:  [place]  [place] will receive a reminder letter in the mail two months in advance. If you don't receive a letter, please call our office to schedule the follow-up appointment.      Any Other Special Instructions Will Be Listed Below (If Applicable).

## 2018-02-02 ENCOUNTER — Ambulatory Visit (HOSPITAL_COMMUNITY): Payer: Medicare HMO | Attending: Physician Assistant

## 2018-02-02 ENCOUNTER — Other Ambulatory Visit: Payer: Self-pay

## 2018-02-02 DIAGNOSIS — I2510 Atherosclerotic heart disease of native coronary artery without angina pectoris: Secondary | ICD-10-CM | POA: Diagnosis not present

## 2018-03-06 ENCOUNTER — Other Ambulatory Visit: Payer: Self-pay | Admitting: Internal Medicine

## 2018-03-14 ENCOUNTER — Other Ambulatory Visit: Payer: Self-pay | Admitting: Internal Medicine

## 2018-03-15 ENCOUNTER — Other Ambulatory Visit: Payer: Self-pay | Admitting: Internal Medicine

## 2018-03-15 MED ORDER — RAMIPRIL 2.5 MG PO CAPS: 2.5000 mg | ORAL_CAPSULE | Freq: Every day | ORAL | 3 refills | Status: DC

## 2018-03-17 NOTE — Telephone Encounter (Signed)
Outpatient Medication Detail    Disp Refills Start End   ramipril (ALTACE) 2.5 MG capsule 90 capsule 3 03/15/2018    Sig - Route: Take 1 capsule (2.5 mg total) by mouth daily. - Oral   Sent to pharmacy as: ramipril (ALTACE) 2.5 MG capsule   E-Prescribing Status: Receipt confirmed by pharmacy (03/15/2018 11:28 AM EST)   Pharmacy   [facility] [place], [place] - [address]

## 2018-04-11 ENCOUNTER — Other Ambulatory Visit: Payer: Self-pay | Admitting: Internal Medicine

## 2018-08-17 ENCOUNTER — Telehealth: Payer: Self-pay | Admitting: Internal Medicine

## 2018-08-17 NOTE — Telephone Encounter (Signed)
New message    Spoke w/ pt wife, asked her to have pt call when he comes in. Told her to have pt ask for me when he calls, please reach out via secure chat. Will offer pt doxemity visit with Dr. Lovena Le.

## 2018-09-13 ENCOUNTER — Other Ambulatory Visit: Payer: Self-pay | Admitting: Primary Care

## 2018-09-13 DIAGNOSIS — I10 Essential (primary) hypertension: Secondary | ICD-10-CM

## 2018-09-13 DIAGNOSIS — E782 Mixed hyperlipidemia: Secondary | ICD-10-CM

## 2018-09-17 ENCOUNTER — Encounter: Payer: Self-pay | Admitting: Family Medicine

## 2018-09-17 ENCOUNTER — Ambulatory Visit (INDEPENDENT_AMBULATORY_CARE_PROVIDER_SITE_OTHER): Payer: Medicare HMO

## 2018-09-17 ENCOUNTER — Ambulatory Visit (HOSPITAL_COMMUNITY): Admission: RE | Admit: 2018-09-17 | Payer: Medicare HMO | Source: Ambulatory Visit

## 2018-09-17 ENCOUNTER — Other Ambulatory Visit: Payer: Self-pay

## 2018-09-17 ENCOUNTER — Other Ambulatory Visit: Payer: Medicare HMO

## 2018-09-17 ENCOUNTER — Ambulatory Visit (INDEPENDENT_AMBULATORY_CARE_PROVIDER_SITE_OTHER): Payer: Medicare HMO | Admitting: Family Medicine

## 2018-09-17 VITALS — BP 130/64 | HR 109 | Temp 99.1°F | Ht 62.0 in | Wt 117.2 lb

## 2018-09-17 DIAGNOSIS — M545 Low back pain, unspecified: Secondary | ICD-10-CM

## 2018-09-17 DIAGNOSIS — Z0000 Encounter for general adult medical examination without abnormal findings: Secondary | ICD-10-CM

## 2018-09-17 DIAGNOSIS — N5089 Other specified disorders of the male genital organs: Secondary | ICD-10-CM

## 2018-09-17 DIAGNOSIS — K4090 Unilateral inguinal hernia, without obstruction or gangrene, not specified as recurrent: Secondary | ICD-10-CM | POA: Diagnosis not present

## 2018-09-17 LAB — CBC WITH DIFFERENTIAL/PLATELET
Absolute Monocytes: 1166 cells/uL — ABNORMAL HIGH (ref 200–950)
Basophils Absolute: 32 cells/uL (ref 0–200)
Basophils Relative: 0.3 %
Eosinophils Absolute: 65 cells/uL (ref 15–500)
Eosinophils Relative: 0.6 %
HCT: 48 % (ref 38.5–50.0)
Hemoglobin: 15.9 g/dL (ref 13.2–17.1)
Lymphs Abs: 1048 cells/uL (ref 850–3900)
MCH: 29.8 pg (ref 27.0–33.0)
MCHC: 33.1 g/dL (ref 32.0–36.0)
MCV: 90.1 fL (ref 80.0–100.0)
MPV: 11.3 fL (ref 7.5–12.5)
Monocytes Relative: 10.8 %
Neutro Abs: 8489 cells/uL — ABNORMAL HIGH (ref 1500–7800)
Neutrophils Relative %: 78.6 %
Platelets: 264 10*3/uL (ref 140–400)
RBC: 5.33 10*6/uL (ref 4.20–5.80)
RDW: 11.8 % (ref 11.0–15.0)
Total Lymphocyte: 9.7 %
WBC: 10.8 10*3/uL (ref 3.8–10.8)

## 2018-09-17 LAB — BASIC METABOLIC PANEL
BUN: 23 mg/dL (ref 7–25)
CO2: 29 mmol/L (ref 20–32)
Calcium: 9.5 mg/dL (ref 8.6–10.3)
Chloride: 99 mmol/L (ref 98–110)
Creat: 0.87 mg/dL (ref 0.70–1.11)
Glucose, Bld: 154 mg/dL — ABNORMAL HIGH (ref 65–99)
Potassium: 4.5 mmol/L (ref 3.5–5.3)
Sodium: 138 mmol/L (ref 135–146)

## 2018-09-17 LAB — POC URINALSYSI DIPSTICK (AUTOMATED)
Blood, UA: NEGATIVE
Glucose, UA: NEGATIVE
Leukocytes, UA: NEGATIVE
Nitrite, UA: NEGATIVE
Protein, UA: POSITIVE — AB
Spec Grav, UA: >=1.03 — AB (ref 1.010–1.025)
Urobilinogen, UA: 2 E.U./dL — AB
pH, UA: 5.5 (ref 5.0–8.0)

## 2018-09-17 MED ORDER — SULFAMETHOXAZOLE-TRIMETHOPRIM 800-160 MG PO TABS: 1.0000 | ORAL_TABLET | Freq: Two times a day (BID) | ORAL | 0 refills | Status: DC

## 2018-09-17 NOTE — Assessment & Plan Note (Addendum)
Progressive bilateral R>L testicular pain and swelling over the last 2 weeks, anticipate bilateral epididymo orchitis, exam also with R sided hernia but doubt incarceration. Will check stat scrotal US r/o mass and further eval hernia.  Will check labs including UA as well as start bactrim DS BID.   ADDENDUM ==> I ordered scrotal ultrasound for TODAY - however I found out after hours that patient had car troubles and rescheduled to Monday. Advised of risks of postponing imaging, reviewed indications to seek ER care over the weekend. Wife expresses understanding. Advised they needed to start antibiotic TODAY.

## 2018-09-17 NOTE — Progress Notes (Signed)
This visit was conducted in person.  BP 130/64 (BP Location: Left Arm, Patient Position: Sitting, Cuff Size: Normal)   Pulse (!) 109   Temp 99.1 F (37.3 C) (Oral)   Ht 5\' 2"  (1.575 m)   Wt 117 lb 3 oz (53.2 kg)   SpO2 93%   BMI 21.43 kg/m    CC: back and testicular pain Subjective:    Patient ID: [name], male    DOB: 02/19/1936, 83 y.o.   MRN: [id number]  HPI: [name] is a 83 y.o. male presenting on 09/17/2018 for Flank Pain (C/o bilateral flank pain. Also, has some minor burning with urination. Sxs started about 2 wks ago. Pt accomapanied by wife, Arbie Cookey. ) and Testicle Pain (C/o bilateral testicle pain and swelling. )   Here with wife Arbie Cookey.  2 wk h/o bilateral testicular swelling associated with lower back pain and urinary hesitancy and weakness. Nocturia x4.   No penile pain or rash or discharge. No swollen glands noted.  No fevers/chills, nausea/vomiting, urgency, frequency.  1 cup coffee/day and 1 coke a day. Avoids spicy foods.  No h/o prostate infection.  No recent abx use.   He has been lifting heavy logs after he cut large tree at home.      Relevant past medical, surgical, family and social history reviewed and updated as indicated. Interim medical history since our last visit reviewed. Allergies and medications reviewed and updated. Outpatient Medications Prior to Visit  Medication Sig Dispense Refill  . aspirin EC 81 MG tablet Take 81 mg by mouth daily.    . metoprolol tartrate (LOPRESSOR) 50 MG tablet TAKE 1 TABLET BY MOUTH TWICE DAILY. PLEASE KEEP UPCOMING APPT IN SEPTEMBER BEFORE ANYMORE REFILLS. 180 tablet 3  . ramipril (ALTACE) 2.5 MG capsule Take 1 capsule (2.5 mg total) by mouth daily. 90 capsule 3  . simvastatin (ZOCOR) 10 MG tablet TAKE 1 TABLET BY MOUTH AT BEDTIME. PLEASE KEEP UPCOMING APPT IN SEPTEMBER BEFORE ANYMORE REFILLS. 90 tablet 2   No facility-administered medications prior to visit.      Per HPI unless specifically  indicated in ROS section below Review of Systems Objective:    BP 130/64 (BP Location: Left Arm, Patient Position: Sitting, Cuff Size: Normal)   Pulse (!) 109   Temp 99.1 F (37.3 C) (Oral)   Ht 5\' 2"  (1.575 m)   Wt 117 lb 3 oz (53.2 kg)   SpO2 93%   BMI 21.43 kg/m   Wt Readings from Last 3 Encounters:  09/17/18 117 lb 3 oz (53.2 kg)  01/26/18 117 lb (53.1 kg)  09/22/17 117 lb 12 oz (53.4 kg)    Physical Exam Vitals signs and nursing note reviewed.  Constitutional:      Appearance: Normal appearance.  Abdominal:     General: Abdomen is flat. Bowel sounds are normal. There is no distension.     Palpations: Abdomen is soft. There is no mass.     Tenderness: There is no abdominal tenderness. There is no right CVA tenderness, left CVA tenderness, guarding or rebound.     Hernia: A hernia is present. Hernia is present in the right inguinal area (tender). There is no hernia in the left inguinal area.  Genitourinary:    Pubic Area: No rash.      Penis: Normal and circumcised.      Scrotum/Testes:        Right: Tenderness and swelling present.        Left:  Tenderness, swelling and testicular hydrocele present.     Comments: Hardened enlarged tender testicles bilaterally.  Lymphadenopathy:     Lower Body: No right inguinal adenopathy. No left inguinal adenopathy.  Neurological:     Mental Status: He is alert.       Lab Results  Component Value Date   PSA 2.00 06/18/2016   PSA 1.61 04/17/2010   PSA 2.17 05/11/2007    Results for orders placed or performed in visit on 09/17/18  CBC with Differential/Platelet  Result Value Ref Range   WBC 10.8 3.8 - 10.8 Thousand/uL   RBC 5.33 4.20 - 5.80 Million/uL   Hemoglobin 15.9 13.2 - 17.1 g/dL   HCT 48.0 38.5 - 50.0 %   MCV 90.1 80.0 - 100.0 fL   MCH 29.8 27.0 - 33.0 pg   MCHC 33.1 32.0 - 36.0 g/dL   RDW 11.8 11.0 - 15.0 %   Platelets 264 140 - 400 Thousand/uL   MPV 11.3 7.5 - 12.5 fL   Neutro Abs 8,489 (H) 1,500 - 7,800  cells/uL   Lymphs Abs 1,048 850 - 3,900 cells/uL   Absolute Monocytes 1,166 (H) 200 - 950 cells/uL   Eosinophils Absolute 65 15 - 500 cells/uL   Basophils Absolute 32 0 - 200 cells/uL   Neutrophils Relative % 78.6 %   Total Lymphocyte 9.7 %   Monocytes Relative 10.8 %   Eosinophils Relative 0.6 %   Basophils Relative 0.3 %  Basic metabolic panel  Result Value Ref Range   Glucose, Bld 154 (H) 65 - 99 mg/dL   BUN 23 7 - 25 mg/dL   Creat 0.87 0.70 - 1.11 mg/dL   BUN/Creatinine Ratio NOT APPLICABLE 6 - 22 (calc)   Sodium 138 135 - 146 mmol/L   Potassium 4.5 3.5 - 5.3 mmol/L   Chloride 99 98 - 110 mmol/L   CO2 29 20 - 32 mmol/L   Calcium 9.5 8.6 - 10.3 mg/dL  POCT Urinalysis Dipstick (Automated)  Result Value Ref Range   Color, UA dark yellow    Clarity, UA clear    Glucose, UA Negative Negative   Bilirubin, UA 1+    Ketones, UA +/-    Spec Grav, UA >=1.030 (A) 1.010 - 1.025   Blood, UA negative    pH, UA 5.5 5.0 - 8.0   Protein, UA Positive (A) Negative   Urobilinogen, UA 2.0 (A) 0.2 or 1.0 E.U./dL   Nitrite, UA negative    Leukocytes, UA Negative Negative    Assessment & Plan:   Problem List Items Addressed This Visit    Testicular swelling - Primary    Progressive bilateral R>L testicular pain and swelling over the last 2 weeks, anticipate bilateral epididymo orchitis, exam also with R sided hernia but doubt incarceration. Will check stat scrotal US r/o mass and further eval hernia.  Will check labs including UA as well as start bactrim DS BID.   ADDENDUM ==> I ordered scrotal ultrasound for TODAY - however I found out after hours that patient had car troubles and rescheduled to Monday. Advised of risks of postponing imaging, reviewed indications to seek ER care over the weekend. Wife expresses understanding. Advised they needed to start antibiotic TODAY.      Relevant Orders   CBC with Differential/Platelet (Completed)   Basic metabolic panel (Completed)   US SCROTUM  W/DOPPLER   Hepatic function panel   POCT Urinalysis Dipstick (Automated) (Completed)   Right inguinal hernia    I  think this is separate from today's issue, will await scrotal US.        Other Visit Diagnoses    Acute midline low back pain without sciatica           Meds ordered this encounter  Medications  . sulfamethoxazole-trimethoprim (BACTRIM DS) 800-160 MG tablet    Sig: Take 1 tablet by mouth 2 (two) times daily.    Dispense:  20 tablet    Refill:  0   Orders Placed This Encounter  Procedures  . US SCROTUM W/DOPPLER    Standing Status:   Future    Standing Expiration Date:   11/17/2019    Order Specific Question:   Reason for Exam (SYMPTOM  OR DIAGNOSIS REQUIRED)    Answer:   bilat testicular swelling    Order Specific Question:   Preferred imaging location?    Answer:   [facility]  . CBC with Differential/Platelet  . Basic metabolic panel  . Hepatic function panel    Standing Status:   Future    Standing Expiration Date:   09/17/2019  . POCT Urinalysis Dipstick (Automated)    Follow up plan: No follow-ups on file.  Ria Bush, MD

## 2018-09-17 NOTE — Progress Notes (Signed)
PCP notes:   Health maintenance:  No gaps identified  Abnormal screenings:   Mini-Cog score: 16/17 MMSE - Mini Mental State Exam 09/17/2018 09/14/2017 06/18/2016  Orientation to time 5 5 5   Orientation to Place 5 5 5   Registration 3 3 3   Attention/ Calculation 0 0 0  Recall 2 0 0  Recall-comments unable to recall 1 of 3 words unable to recall 3 of 3 woreds pt was unable to recall 3 of 3 words  Language- name 2 objects 0 0 0  Language- repeat 1 1 1   Language- follow 3 step command 0 3 3  Language- read & follow direction 0 0 0  Write a sentence 0 0 0  Copy design 0 0 0  Total score 16 17 17     Patient concerns:   Flank plain. Pain scale: 3/10 Red, swollen, warm, painful testicles. Onset approx. 2 wks ago. Dribbling when urinating.  PCP notified. Same day acute appt scheduled with Dr. Danise Mina.   Nurse concerns:  None  Next PCP appt:   09/20/18 @ 0820

## 2018-09-17 NOTE — Progress Notes (Signed)
Subjective:   [name] is a 83 y.o. male who presents for Medicare Annual/Subsequent preventive examination.  Review of Systems:  N/A Cardiac Risk Factors include: advanced age (>68men, >44 women);male gender;dyslipidemia;hypertension     Objective:    Vitals: There were no vitals taken for this visit.  There is no height or weight on file to calculate BMI.  Advanced Directives 09/17/2018 09/14/2017 06/18/2016  Does Patient Have a Medical Advance Directive? No No No  Would patient like information on creating a medical advance directive? No - Patient declined No - Patient declined -    Tobacco Social History   Tobacco Use  Smoking Status Former Smoker  . Packs/day: 1.00  . Years: 30.00  . Pack years: 30.00  . Last attempt to quit: 04/14/1986  . Years since quitting: 32.4  Smokeless Tobacco Never Used     Counseling given: No   Clinical Intake:  Pre-visit preparation completed: Yes  Pain Score: 3      Nutritional Status: BMI of 19-24  Normal Nutritional Risks: None Diabetes: No  How often do you need to have someone help you when you read instructions, pamphlets, or other written materials from your doctor or pharmacy?: 1 - Never What is the last grade level you completed in school?: 9th grade  Interpreter Needed?: No  Comments: pt lives with spouse Information entered by :: LPinson, LPN  Past Medical History:  Diagnosis Date  . Cataract 2020   bilateral cataracts  . Coronary artery disease   . Epididymitis    Left  . GERD (gastroesophageal reflux disease)   . Hyperlipidemia   . Hypertension   . [place]    first at age 34  . Routine general medical examination at a health care facility   . Skin lesions, generalized    Past Surgical History:  Procedure Laterality Date  . APPENDECTOMY    . CARDIAC CATHETERIZATION     MI x4 with stents   . CHOLECYSTECTOMY    . HIATAL HERNIA REPAIR    . INGUINAL HERNIA REPAIR    .  TONSILLECTOMY     Family History  Problem Relation Age of Onset  . Asthma Mother    Social History   Socioeconomic History  . Marital status: Married    Spouse name: Not on file  . Number of children: Not on file  . Years of education: Not on file  . Highest education level: Not on file  Occupational History  . Not on file  Social Needs  . Financial resource strain: Not on file  . Food insecurity:    Worry: Not on file    Inability: Not on file  . Transportation needs:    Medical: Not on file    Non-medical: Not on file  Tobacco Use  . Smoking status: Former Smoker    Packs/day: 1.00    Years: 30.00    Pack years: 30.00    Last attempt to quit: 04/14/1986    Years since quitting: 32.4  . Smokeless tobacco: Never Used  Substance and Sexual Activity  . Alcohol use: No  . Drug use: No  . Sexual activity: Not Currently  Lifestyle  . Physical activity:    Days per week: Not on file    Minutes per session: Not on file  . Stress: Not on file  Relationships  . Social connections:    Talks on phone: Not on file    Gets together: Not on  file    Attends religious service: Not on file    Active member of club or organization: Not on file    Attends meetings of clubs or organizations: Not on file    Relationship status: Not on file  Other Topics Concern  . Not on file  Social History Narrative   Married.   6 children, 12 grandchildren, 12 great grandchildren.   Once worked as a Games developer.   Enjoys relaxing, spending time with family.    Outpatient Encounter Medications as of 09/17/2018  Medication Sig  . aspirin EC 81 MG tablet Take 81 mg by mouth daily.  . metoprolol tartrate (LOPRESSOR) 50 MG tablet TAKE 1 TABLET BY MOUTH TWICE DAILY. PLEASE KEEP UPCOMING APPT IN SEPTEMBER BEFORE ANYMORE REFILLS.  . ramipril (ALTACE) 2.5 MG capsule Take 1 capsule (2.5 mg total) by mouth daily.  . simvastatin (ZOCOR) 10 MG tablet TAKE 1 TABLET BY MOUTH AT BEDTIME. PLEASE KEEP UPCOMING  APPT IN SEPTEMBER BEFORE ANYMORE REFILLS.  . [DISCONTINUED] ranitidine (ZANTAC) 150 MG capsule Take 150 mg by mouth 2 (two) times daily.     No facility-administered encounter medications on file as of 09/17/2018.     Activities of Daily Living In your present state of health, do you have any difficulty performing the following activities: 09/17/2018  Hearing? Y  Vision? Y  Difficulty concentrating or making decisions? Y  Walking or climbing stairs? N  Dressing or bathing? N  Doing errands, shopping? N  Preparing Food and eating ? N  Using the Toilet? N  In the past six months, have you accidently leaked urine? N  Do you have problems with loss of bowel control? N  Managing your Medications? N  Managing your Finances? N  Housekeeping or managing your Housekeeping? N  Some recent data might be hidden    Patient Care Team: Pleas Koch, NP as PCP - General (Internal Medicine)   Assessment:   This is a routine wellness examination for [name].  Vision Screening Comments: Last vision exam date unknown  Exercise Activities and Dietary recommendations Current Exercise Habits: The patient does not participate in regular exercise at present(does yardwork ), Exercise limited by: None identified  Goals    . Patient Stated     Starting 09/17/2018, I will continue to take medications as prescribed.        Fall Risk Fall Risk  09/17/2018 09/14/2017 06/18/2016  Falls in the past year? 0 No Yes  Comment - - pt fell and hurt right shoulder  Number falls in past yr: - - 2 or more  Injury with Fall? - - Yes   Depression Screen PHQ 2/9 Scores 09/17/2018 09/14/2017 06/18/2016  PHQ - 2 Score 0 0 0  PHQ- 9 Score 0 0 -    Cognitive Function MMSE - Mini Mental State Exam 09/17/2018 09/14/2017 06/18/2016  Orientation to time 5 5 5   Orientation to Place 5 5 5   Registration 3 3 3   Attention/ Calculation 0 0 0  Recall 2 0 0  Recall-comments unable to recall 1 of 3 words unable to recall 3 of 3 woreds  pt was unable to recall 3 of 3 words  Language- name 2 objects 0 0 0  Language- repeat 1 1 1   Language- follow 3 step command 0 3 3  Language- read & follow direction 0 0 0  Write a sentence 0 0 0  Copy design 0 0 0  Total score 16 17 17  PLEASE NOTE: A Mini-Cog screen was completed. Maximum score is 17. A value of 0 denotes this part of Folstein MMSE was not completed or the patient failed this part of the Mini-Cog screening.   Mini-Cog Screening Orientation to Time - Max 5 pts Orientation to Place - Max 5 pts Registration - Max 3 pts Recall - Max 3 pts Language Repeat - Max 1 pts      Immunization History  Administered Date(s) Administered  . Pneumococcal Conjugate-13 09/22/2017    Screening Tests Health Maintenance  Topic Date Due  . INFLUENZA VACCINE  07/12/2025 (Originally 11/13/2018)  . TETANUS/TDAP  07/12/2025 (Originally 12/23/1954)  . PNA vac Low Risk Adult (2 of 2 - PPSV23) 09/23/2018        Plan:    I have personally reviewed, addressed, and noted the following in the patient's chart:  A. Medical and social history B. Use of alcohol, tobacco or illicit drugs  C. Current medications and supplements D. Functional ability and status E.  Nutritional status F.  Physical activity G. Advance directives H. List of other physicians I.  Hospitalizations, surgeries, and ER visits in previous 12 months J.  Vitals (unless it is a telemedicine encounter) K. Screenings to include cognitive, depression, hearing, vision (NOTE: hearing and vision screenings not completed in telemedicine encounter) L. Referrals and appointments   In addition, I have reviewed and discussed with patient certain preventive protocols, quality metrics, and best practice recommendations. A written personalized care plan for preventive services and recommendations were provided to patient.  With patient's permission, we connected on 09/17/18 at 10:00 AM EDT. Interactive audio and video  telecommunications were attempted with patient. This attempt was unsuccessful due to patient having technical difficulties OR patient did not have access to video capability.  Encounter was completed with audio only.  Two patient identifiers were used to ensure the encounter occurred with the correct person. Patient was in home and writer was in office.   Signed,   Lindell Noe, MHA, BS, LPN Health Coach

## 2018-09-17 NOTE — Patient Instructions (Addendum)
Labs today  See our referral coordinator to schedule scrotal ultrasound for today.  We will be in touch with results

## 2018-09-17 NOTE — Assessment & Plan Note (Signed)
I think this is separate from today's issue, will await scrotal US.

## 2018-09-17 NOTE — Patient Instructions (Signed)
[name] , Thank you for taking time to come for your Medicare Wellness Visit. I appreciate your ongoing commitment to your health goals. Please review the following plan we discussed and let me know if I can assist you in the future.   These are the goals we discussed: Goals    . Patient Stated     Starting 09/17/2018, I will continue to take medications as prescribed.        This is a list of the screening recommended for you and due dates:  Health Maintenance  Topic Date Due  . Flu Shot  07/12/2025*  . Tetanus Vaccine  07/12/2025*  . Pneumonia vaccines (2 of 2 - PPSV23) 09/23/2018  *Topic was postponed. The date shown is not the original due date.   Preventive Care for Adults  A healthy lifestyle and preventive care can promote health and wellness. Preventive health guidelines for adults include the following key practices.  . A routine yearly physical is a good way to check with your health care provider about your health and preventive screening. It is a chance to share any concerns and updates on your health and to receive a thorough exam.  . Visit your dentist for a routine exam and preventive care every 6 months. Brush your teeth twice a day and floss once a day. Good oral hygiene prevents tooth decay and gum disease.  . The frequency of eye exams is based on your age, health, family medical history, use  of contact lenses, and other factors. Follow your health care provider's recommendations for frequency of eye exams.  . Eat a healthy diet. Foods like vegetables, fruits, whole grains, low-fat dairy products, and lean protein foods contain the nutrients you need without too many calories. Decrease your intake of foods high in solid fats, added sugars, and salt. Eat the right amount of calories for you. Get information about a proper diet from your health care provider, if necessary.  . Regular physical exercise is one of the most important things you can do for your  health. Most adults should get at least 150 minutes of moderate-intensity exercise (any activity that increases your heart rate and causes you to sweat) each week. In addition, most adults need muscle-strengthening exercises on 2 or more days a week.  Silver Sneakers may be a benefit available to you. To determine eligibility, you may visit the website: www.silversneakers.com or contact program at 254-684-7767 Mon-Fri between 8AM-8PM.   . Maintain a healthy weight. The body mass index (BMI) is a screening tool to identify possible weight problems. It provides an estimate of body fat based on height and weight. Your health care provider can find your BMI and can help you achieve or maintain a healthy weight.   For adults 20 years and older: ? A BMI below 18.5 is considered underweight. ? A BMI of 18.5 to 24.9 is normal. ? A BMI of 25 to 29.9 is considered overweight. ? A BMI of 30 and above is considered obese.   . Maintain normal blood lipids and cholesterol levels by exercising and minimizing your intake of saturated fat. Eat a balanced diet with plenty of fruit and vegetables. Blood tests for lipids and cholesterol should begin at age 2 and be repeated every 5 years. If your lipid or cholesterol levels are high, you are over 50, or you are at high risk for heart disease, you may need your cholesterol levels checked more frequently. Ongoing high lipid and cholesterol levels should  be treated with medicines if diet and exercise are not working.  . If you smoke, find out from your health care provider how to quit. If you do not use tobacco, please do not start.  . If you choose to drink alcohol, please do not consume more than 2 drinks per day. One drink is considered to be 12 ounces (355 mL) of beer, 5 ounces (148 mL) of wine, or 1.5 ounces (44 mL) of liquor.  . If you are 45-47 years old, ask your health care provider if you should take aspirin to prevent strokes.  . Use sunscreen. Apply  sunscreen liberally and repeatedly throughout the day. You should seek shade when your shadow is shorter than you. Protect yourself by wearing long sleeves, pants, a wide-brimmed hat, and sunglasses year round, whenever you are outdoors.  . Once a month, do a whole body skin exam, using a mirror to look at the skin on your back. Tell your health care provider of new moles, moles that have irregular borders, moles that are larger than a pencil eraser, or moles that have changed in shape or color.

## 2018-09-20 ENCOUNTER — Ambulatory Visit: Payer: Medicare HMO | Admitting: Family Medicine

## 2018-09-20 ENCOUNTER — Ambulatory Visit (INDEPENDENT_AMBULATORY_CARE_PROVIDER_SITE_OTHER): Payer: Medicare HMO | Admitting: Primary Care

## 2018-09-20 ENCOUNTER — Other Ambulatory Visit: Payer: Self-pay | Admitting: Family Medicine

## 2018-09-20 ENCOUNTER — Other Ambulatory Visit: Payer: Self-pay

## 2018-09-20 ENCOUNTER — Ambulatory Visit (HOSPITAL_COMMUNITY)
Admission: RE | Admit: 2018-09-20 | Discharge: 2018-09-20 | Disposition: A | Discharge status: Home / Self Care | Payer: Medicare HMO | Source: Ambulatory Visit | Attending: Family Medicine | Admitting: Family Medicine

## 2018-09-20 DIAGNOSIS — N5089 Other specified disorders of the male genital organs: Secondary | ICD-10-CM | POA: Diagnosis not present

## 2018-09-20 DIAGNOSIS — E782 Mixed hyperlipidemia: Secondary | ICD-10-CM

## 2018-09-20 DIAGNOSIS — K219 Gastro-esophageal reflux disease without esophagitis: Secondary | ICD-10-CM

## 2018-09-20 DIAGNOSIS — I10 Essential (primary) hypertension: Secondary | ICD-10-CM

## 2018-09-20 DIAGNOSIS — I252 Old myocardial infarction: Secondary | ICD-10-CM

## 2018-09-20 DIAGNOSIS — N433 Hydrocele, unspecified: Secondary | ICD-10-CM | POA: Diagnosis not present

## 2018-09-20 NOTE — Patient Instructions (Addendum)
Come by the lab for labs as discussed.   Complete the ultrasound today as scheduled.   Continue the antibiotics as prescribed.   It was a pleasure to see you today!

## 2018-09-20 NOTE — Assessment & Plan Note (Signed)
Repeat lipids pending. Continue simvastatin. History of CAD.

## 2018-09-20 NOTE — Assessment & Plan Note (Signed)
Asymptomatic. Compliant to statin and aspirin. Continue same.

## 2018-09-20 NOTE — Assessment & Plan Note (Signed)
Infrequent. Not using anything OTC.

## 2018-09-20 NOTE — Assessment & Plan Note (Signed)
Stable in the office. BMP reviewed. Continue ramipril 2.5 mg.

## 2018-09-20 NOTE — Progress Notes (Signed)
Subjective:    Patient ID: [name], male    DOB: 1935-07-18, 83 y.o.   MRN: [id number]  HPI     [name] - 83 y.o. male  MRN [id number]  Date of Birth: 1935-08-31  PCP: Pleas Koch, NP  This service was provided via telemedicine. Phone Visit performed on 09/20/2018    Rationale for phone visit along with limitations reviewed. Patient consented to telephone encounter.    Location of patient: Home Location of provider: Office [place] @ [facility] Name of referring provider: N/A   Names of persons and role in encounter: Provider: Pleas Koch, NP  Patient: [name]  Other: N/A   Time on call: 11 min - 53 seconds   Subjective: Follow up of chronic conditions.     HPI:  [name] is an 83 year old male who presents today for follow up of chronic conditions.  1) Essential Hypertension: Currently managed on metoprolol tartrate 50 mg BID, ramipril 2.5 mg daily. He denies chest pain, dizziness, shortness of breath.   BP Readings from Last 3 Encounters:  09/17/18 130/64  01/26/18 (!) 142/70  09/22/17 140/70    2) Hyperlipidemia: Currently managed on simvastatin 10 mg. His last lipid panel was normal in June 2019. He is compliant to his medication.   3) Scrotal Swelling: One month history of scrotal swelling, pain began over the last several days. Also with symptoms of dysuria, redness, difficulty urinating. He was evaluated by Dr. Danise Mina on Friday last week, advised to proceed for ultrasound of the scrotum and to start Bactrim DS tablets. Since then he's noticed slight improvement in urine color, otherwise no changes in discomfort.   He denies fevers.    Objective/Observations:   No physical exam or vital signs collected unless specifically identified below.   There were no vitals taken for this visit.   Respiratory status: speaks in complete sentences without evident shortness of breath.   Assessment/Plan:  See  problem based charting.  No problem-specific Assessment & Plan notes found for this encounter.   I discussed the assessment and treatment plan with the patient. The patient was provided an opportunity to ask questions and all were answered. The patient agreed with the plan and demonstrated an understanding of the instructions.  Lab Orders  No laboratory test(s) ordered today    No orders of the defined types were placed in this encounter.   The patient was advised to call back or seek an in-person evaluation if the symptoms worsen or if the condition fails to improve as anticipated.  Pleas Koch, NP    Review of Systems  Constitutional: Negative for fever.  Eyes: Negative for visual disturbance.  Respiratory: Negative for shortness of breath.   Cardiovascular: Negative for chest pain.  Gastrointestinal: Negative for abdominal pain.  Genitourinary: Positive for dysuria, scrotal swelling and testicular pain. Negative for flank pain, hematuria and penile pain.  Neurological: Negative for dizziness and headaches.       Past Medical History:  Diagnosis Date  . Cataract 2020   bilateral cataracts  . Coronary artery disease   . Epididymitis    Left  . GERD (gastroesophageal reflux disease)   . Hyperlipidemia   . Hypertension   . MI (myocardial infarction) ([place])    first at age 85  . Routine general medical examination at a health care facility   . Skin lesions, generalized      Social History   Socioeconomic  History  . Marital status: Married    Spouse name: Not on file  . Number of children: Not on file  . Years of education: Not on file  . Highest education level: Not on file  Occupational History  . Not on file  Social Needs  . Financial resource strain: Not on file  . Food insecurity:    Worry: Not on file    Inability: Not on file  . Transportation needs:    Medical: Not on file    Non-medical: Not on file  Tobacco Use  . Smoking status: Former  Smoker    Packs/day: 1.00    Years: 30.00    Pack years: 30.00    Last attempt to quit: 04/14/1986    Years since quitting: 32.4  . Smokeless tobacco: Never Used  Substance and Sexual Activity  . Alcohol use: No  . Drug use: No  . Sexual activity: Not Currently  Lifestyle  . Physical activity:    Days per week: Not on file    Minutes per session: Not on file  . Stress: Not on file  Relationships  . Social connections:    Talks on phone: Not on file    Gets together: Not on file    Attends religious service: Not on file    Active member of club or organization: Not on file    Attends meetings of clubs or organizations: Not on file    Relationship status: Not on file  . Intimate partner violence:    Fear of current or ex partner: Not on file    Emotionally abused: Not on file    Physically abused: Not on file    Forced sexual activity: Not on file  Other Topics Concern  . Not on file  Social History Narrative   Married.   6 children, 12 grandchildren, 12 great grandchildren.   Once worked as a Games developer.   Enjoys relaxing, spending time with family.    Past Surgical History:  Procedure Laterality Date  . APPENDECTOMY    . CARDIAC CATHETERIZATION     MI x4 with stents   . CHOLECYSTECTOMY    . HIATAL HERNIA REPAIR    . INGUINAL HERNIA REPAIR    . TONSILLECTOMY      Family History  Problem Relation Age of Onset  . Asthma Mother     No Known Allergies  Current Outpatient Medications on File Prior to Visit  Medication Sig Dispense Refill  . aspirin EC 81 MG tablet Take 81 mg by mouth daily.    . metoprolol tartrate (LOPRESSOR) 50 MG tablet TAKE 1 TABLET BY MOUTH TWICE DAILY. PLEASE KEEP UPCOMING APPT IN SEPTEMBER BEFORE ANYMORE REFILLS. 180 tablet 3  . ramipril (ALTACE) 2.5 MG capsule Take 1 capsule (2.5 mg total) by mouth daily. 90 capsule 3  . simvastatin (ZOCOR) 10 MG tablet TAKE 1 TABLET BY MOUTH AT BEDTIME. PLEASE KEEP UPCOMING APPT IN SEPTEMBER BEFORE ANYMORE  REFILLS. 90 tablet 2  . sulfamethoxazole-trimethoprim (BACTRIM DS) 800-160 MG tablet Take 1 tablet by mouth 2 (two) times daily. 20 tablet 0   No current facility-administered medications on file prior to visit.     There were no vitals taken for this visit.   Objective:   Physical Exam  Constitutional: He is oriented to person, place, and time.  Respiratory: Effort normal.  Neurological: He is alert and oriented to person, place, and time.  Psychiatric: He has a normal mood and affect.  Assessment & Plan:

## 2018-09-20 NOTE — Assessment & Plan Note (Signed)
Evaluated by Dr. Danise Mina last week, ultrasound pending. Mild improvement in symptoms and is compliant to antibiotics. Continue antibiotics, we will be in touch once we receive ultrasound results. He seems stable.

## 2018-09-20 NOTE — Progress Notes (Signed)
I reviewed health advisor's note, was available for consultation, and agree with documentation and plan.  

## 2018-10-14 ENCOUNTER — Emergency Department (HOSPITAL_COMMUNITY): Payer: Medicare HMO

## 2018-10-14 ENCOUNTER — Emergency Department (HOSPITAL_COMMUNITY)
Admission: EM | Admit: 2018-10-14 | Discharge: 2018-10-14 | Disposition: A | Discharge status: Home / Self Care | Payer: Medicare HMO | Attending: Emergency Medicine | Admitting: Emergency Medicine

## 2018-10-14 ENCOUNTER — Other Ambulatory Visit: Payer: Self-pay

## 2018-10-14 ENCOUNTER — Encounter (HOSPITAL_COMMUNITY): Payer: Self-pay | Admitting: Emergency Medicine

## 2018-10-14 DIAGNOSIS — Z7982 Long term (current) use of aspirin: Secondary | ICD-10-CM | POA: Diagnosis not present

## 2018-10-14 DIAGNOSIS — R310 Gross hematuria: Secondary | ICD-10-CM | POA: Diagnosis not present

## 2018-10-14 DIAGNOSIS — I10 Essential (primary) hypertension: Secondary | ICD-10-CM | POA: Insufficient documentation

## 2018-10-14 DIAGNOSIS — Z79899 Other long term (current) drug therapy: Secondary | ICD-10-CM | POA: Diagnosis not present

## 2018-10-14 DIAGNOSIS — N433 Hydrocele, unspecified: Secondary | ICD-10-CM | POA: Diagnosis not present

## 2018-10-14 DIAGNOSIS — N3289 Other specified disorders of bladder: Secondary | ICD-10-CM

## 2018-10-14 DIAGNOSIS — Z87891 Personal history of nicotine dependence: Secondary | ICD-10-CM | POA: Insufficient documentation

## 2018-10-14 DIAGNOSIS — I2510 Atherosclerotic heart disease of native coronary artery without angina pectoris: Secondary | ICD-10-CM | POA: Diagnosis not present

## 2018-10-14 DIAGNOSIS — I252 Old myocardial infarction: Secondary | ICD-10-CM | POA: Diagnosis not present

## 2018-10-14 DIAGNOSIS — R300 Dysuria: Secondary | ICD-10-CM | POA: Diagnosis present

## 2018-10-14 LAB — CBC
HCT: 47 % (ref 39.0–52.0)
Hemoglobin: 15 g/dL (ref 13.0–17.0)
MCH: 29.2 pg (ref 26.0–34.0)
MCHC: 31.9 g/dL (ref 30.0–36.0)
MCV: 91.6 fL (ref 80.0–100.0)
Platelets: 452 10*3/uL — ABNORMAL HIGH (ref 150–400)
RBC: 5.13 MIL/uL (ref 4.22–5.81)
RDW: 11.6 % (ref 11.5–15.5)
WBC: 15 10*3/uL — ABNORMAL HIGH (ref 4.0–10.5)
nRBC: 0 % (ref 0.0–0.2)

## 2018-10-14 LAB — BASIC METABOLIC PANEL
Anion gap: 13 (ref 5–15)
BUN: 24 mg/dL — ABNORMAL HIGH (ref 8–23)
CO2: 27 mmol/L (ref 22–32)
Calcium: 9.7 mg/dL (ref 8.9–10.3)
Chloride: 100 mmol/L (ref 98–111)
Creatinine, Ser: 1.11 mg/dL (ref 0.61–1.24)
GFR calc Af Amer: >60 mL/min (ref >60)
GFR calc non Af Amer: >60 mL/min (ref >60)
Glucose, Bld: 125 mg/dL — ABNORMAL HIGH (ref 70–99)
Potassium: 4.1 mmol/L (ref 3.5–5.1)
Sodium: 140 mmol/L (ref 135–145)

## 2018-10-14 LAB — URINALYSIS, ROUTINE W REFLEX MICROSCOPIC
Bilirubin Urine: NEGATIVE
Glucose, UA: 50 mg/dL — AB
Ketones, ur: NEGATIVE mg/dL
Leukocytes,Ua: NEGATIVE
Nitrite: NEGATIVE
Protein, ur: 100 mg/dL — AB
RBC / HPF: >50 RBC/hpf — ABNORMAL HIGH (ref 0–5)
Specific Gravity, Urine: 1.017 (ref 1.005–1.030)
pH: 6 (ref 5.0–8.0)

## 2018-10-14 NOTE — ED Provider Notes (Signed)
[facility] Provider Note   CSN: [id number] Arrival date & time: 10/14/18  1250    History   Chief Complaint Chief Complaint  Patient presents with   Dysuria    HPI [name] is a 83 y.o. male with a past medical history of hypertension, hyperlipidemia, CAD, inguinal hernia presents to ED for 1 week history of dysuria and hematuria.  Reports increased urinary frequency as well.  Notes history of similar symptoms in the past but states that "I do not know what happened last time."  He denies any penile discharge, abdominal pain or back pain, fevers, history of kidney stones or UTI that he is aware of.     HPI  Past Medical History:  Diagnosis Date   Cataract 2020   bilateral cataracts   Coronary artery disease    Epididymitis    Left   GERD (gastroesophageal reflux disease)    Hyperlipidemia    Hypertension    MI (myocardial infarction) ([place])    first at age 89   Routine general medical examination at a health care facility    Skin lesions, generalized     Patient Active Problem List   Diagnosis Date Noted   Testicular swelling 09/17/2018   Right inguinal hernia 09/17/2018   Encounter for annual general medical examination with abnormal findings in adult 09/22/2017   Recurrent falls 06/24/2016   Right shoulder pain 10/18/2015   Essential hypertension 10/17/2008   History of myocardial infarction 10/17/2008   GERD 10/17/2008   HYPERLIPIDEMIA 03/05/2007   Chronic ischemic heart disease 03/05/2007   SKIN LESIONS, MULTIPLE 03/05/2007    Past Surgical History:  Procedure Laterality Date   APPENDECTOMY     CARDIAC CATHETERIZATION     MI x4 with stents    CHOLECYSTECTOMY     HIATAL [place] Medications    Prior to Admission medications   Medication Sig Start Date End Date Taking? Authorizing Provider  aspirin EC 81 MG  tablet Take 81 mg by mouth daily.    [provider]  metoprolol tartrate (LOPRESSOR) 50 MG tablet TAKE 1 TABLET BY MOUTH TWICE DAILY. PLEASE KEEP UPCOMING APPT IN SEPTEMBER BEFORE ANYMORE REFILLS. 03/08/18   Evans Lance, MD  ramipril (ALTACE) 2.5 MG capsule Take 1 capsule (2.5 mg total) by mouth daily. 03/15/18   Evans Lance, MD  simvastatin (ZOCOR) 10 MG tablet TAKE 1 TABLET BY MOUTH AT BEDTIME. PLEASE KEEP UPCOMING APPT IN SEPTEMBER BEFORE ANYMORE REFILLS. 04/12/18   Evans Lance, MD  sulfamethoxazole-trimethoprim (BACTRIM DS) 800-160 MG tablet Take 1 tablet by mouth 2 (two) times daily. 09/17/18   Ria Bush, MD    Family History Family History  Problem Relation Age of Onset   Asthma Mother     Social History Social History   Tobacco Use   Smoking status: Former Smoker    Packs/day: 1.00    Years: 30.00    Pack years: 30.00    Quit date: 04/14/1986    Years since quitting: 32.5   Smokeless tobacco: Never Used  Substance Use Topics   Alcohol use: No   Drug use: No     Allergies   Patient has no known allergies.   Review of Systems Review of Systems  Constitutional: Negative for appetite change, chills and fever.  HENT: Negative for ear pain,  rhinorrhea, sneezing and sore throat.   Eyes: Negative for photophobia and visual disturbance.  Respiratory: Negative for cough, chest tightness, shortness of breath and wheezing.   Cardiovascular: Negative for chest pain and palpitations.  Gastrointestinal: Negative for abdominal pain, blood in stool, constipation, diarrhea, nausea and vomiting.  Genitourinary: Positive for dysuria and hematuria. Negative for urgency.  Musculoskeletal: Negative for myalgias.  Skin: Negative for rash.  Neurological: Negative for dizziness, weakness and light-headedness.     Physical Exam Updated Vital Signs BP (!) 162/74 (BP Location: Right Arm)    Pulse 68    Temp 97.8 F (36.6 C) (Oral)    Resp 20    Ht 5\' 4"   (1.626 m)    Wt 53.1 kg    SpO2 97%    BMI 20.08 kg/m   Physical Exam Vitals signs and nursing note reviewed.  Constitutional:      General: He is not in acute distress.    Appearance: He is well-developed.  HENT:     Head: Normocephalic and atraumatic.     Nose: Nose normal.  Eyes:     General: No scleral icterus.       Right eye: No discharge.        Left eye: No discharge.     Conjunctiva/sclera: Conjunctivae normal.  Neck:     Musculoskeletal: Normal range of motion and neck supple.  Cardiovascular:     Rate and Rhythm: Normal rate and regular rhythm.     Heart sounds: Normal heart sounds. No murmur. No friction rub. No gallop.   Pulmonary:     Effort: Pulmonary effort is normal. No respiratory distress.     Breath sounds: Normal breath sounds.  Abdominal:     General: Bowel sounds are normal. There is no distension.     Palpations: Abdomen is soft.     Tenderness: There is no abdominal tenderness. There is no guarding.  Musculoskeletal: Normal range of motion.  Skin:    General: Skin is warm and dry.     Findings: No rash.  Neurological:     Mental Status: He is alert.     Motor: No abnormal muscle tone.     Coordination: Coordination normal.      ED Treatments / Results  Labs (all labs ordered are listed, but only abnormal results are displayed) Labs Reviewed  URINALYSIS, ROUTINE W REFLEX MICROSCOPIC - Abnormal; Notable for the following components:      Result Value   Color, Urine AMBER (*)    APPearance CLOUDY (*)    Glucose, UA 50 (*)    Hgb urine dipstick LARGE (*)    Protein, ur 100 (*)    RBC / HPF >50 (*)    Bacteria, UA RARE (*)    All other components within normal limits  BASIC METABOLIC PANEL - Abnormal; Notable for the following components:   Glucose, Bld 125 (*)    BUN 24 (*)    All other components within normal limits  CBC - Abnormal; Notable for the following components:   WBC 15.0 (*)    Platelets 452 (*)    All other components  within normal limits  URINE CULTURE    EKG None  Radiology Ct Renal Stone Study  Result Date: 10/14/2018 CLINICAL DATA:  83 year old male with gross hematuria and difficulty urinating for 2 days. EXAM: CT ABDOMEN AND PELVIS WITHOUT CONTRAST TECHNIQUE: Multidetector CT imaging of the abdomen and pelvis was performed following the standard protocol without IV  contrast. COMPARISON:  None. FINDINGS: Lower chest: Negative. Hepatobiliary: Surgically absent gallbladder. Negative noncontrast liver. Pancreas: Negative. Spleen: Negative. Adrenals/Urinary Tract: Normal adrenal glands. Left renal midpole scarring associated with nephrocalcinosis and/or nephrolithiasis up to 6 millimeters (series 3, image 26). There is no left hydronephrosis or perinephric stranding. The left ureter is unremarkable to the bladder. Negative noncontrast right kidney. Negative right ureter to the urinary bladder. Relatively small bladder volume. Bladder is heterogeneous and abnormal with rounded masslike area at the base of the bladder contiguous with the prostate (sagittal series 7, image 53 and series 3, image 72) as well as posterior bladder wall thickening up to 2 centimeters. No perivesical stranding. Stomach/Bowel: Decompressed and negative descending and rectosigmoid colon. Gas distended transverse colon is redundant. Gas and stool in the right colon. No large bowel inflammation. Diminutive or absent appendix. Negative terminal ileum. No dilated small bowel, there are gas-filled loops of jejunum in the left upper quadrant. The stomach is decompressed. No free air, free fluid. Vascular/Lymphatic: Extensive Aortoiliac calcified atherosclerosis. Vascular patency is not evaluated in the absence of IV contrast. No lymphadenopathy. Reproductive: Relatively large bilateral scrotal hydroceles (series 8, images 1 and 5). Inseparable appearance of the prostate from the abnormal base of the urinary bladder (sagittal image 53) as stated above.  Other: No pelvic free fluid. Musculoskeletal: Degenerative changes in the spine and pelvis. No acute or suspicious osseous lesion identified. IMPRESSION: 1. Abnormal urinary bladder with differential considerations of bladder mass, blood clot due to hematuria, and prostate mass invading the bladder. No pelvic lymphadenopathy. Recommend Urology consultation. 2. Negative noncontrast kidneys aside from left renal midpole scarring and nephrocalcinosis/nephrolithiasis. 3. Large bilateral scrotal hydroceles. 4.  Aortic Atherosclerosis (ICD10-I70.0). Electronically Signed   By: Genevie Ann M.D.   On: 10/14/2018 15:28    Procedures Procedures (including critical care time)  Medications Ordered in ED Medications - No data to display   Initial Impression / Assessment and Plan / ED Course  I have reviewed the triage vital signs and the nursing notes.  Pertinent labs & imaging results that were available during my care of the patient were reviewed by me and considered in my medical decision making (see chart for details).        83 year old male presents to ED for dysuria and hematuria.  Reports increased urinary frequency for the past week.  Denies any abdominal pain or back pain, fever.  States that this is happened to him in the past but unsure why, states "they put a rod up there but yall gotta put me to sleep for that." He states it was not a catheter, not a scope. He is unsure what it was. He doesn't remember when it was "honey, I don't go to the doctor ever so don't ask me when it was because I don't remember." He is not currently anticoagulated. No CVA tenderness noted. Abdomen is soft, NTND. Painless hematuria concern for cancer. Urinalysis shows hematuria, rare bacteria. CBC with leukocytosis, BMP unremarkable. CT shows abnormal mass in bladder could be prostate mass extending into bladder. Urology will contact patient regarding follow up/cystoscopy.  Patient is hemodynamically stable, in NAD, and  able to ambulate in the ED. Evaluation does not show pathology that would require ongoing emergent intervention or inpatient treatment. I explained the diagnosis to the patient. Pain has been managed and has no complaints prior to discharge. Patient is comfortable with above plan and is stable for discharge at this time. All questions were answered prior to disposition. Strict  return precautions for returning to the ED were discussed. Encouraged follow up with PCP.   An After Visit Summary was printed and given to the patient.   Portions of this note were generated with [employer]. Dictation errors may occur despite best attempts at proofreading.    Final Clinical Impressions(s) / ED Diagnoses   Final diagnoses:  Gross hematuria  Bladder mass    ED Discharge Orders    None       Delia Heady, PA-C 10/14/18 1624    Lacretia Leigh, MD 10/17/18 [phone]

## 2018-10-14 NOTE — ED Triage Notes (Signed)
Pt states every time he tries to urinate it burns. Pt complains of urinary frequency and blood in his urine. These symptoms have been going on for a week

## 2018-10-14 NOTE — ED Provider Notes (Signed)
Medical screening examination/treatment/procedure(s) were conducted as a shared visit with non-physician practitioner(s) and myself.  I personally evaluated the patient during the encounter.    83 year old male presents with dysuria as well as hematuria.  Urinalysis shows increased red blood cells.  Symptoms are suspicious for bladder cancer and CT is pending at this time.   Lacretia Leigh, MD 10/14/18 1524

## 2018-10-14 NOTE — Discharge Instructions (Addendum)
The urologist will contact you regarding follow up and scope of the bladder to evaluate for the mass in the bladder. Return to the ED if you start to have worsening symptoms, develop a fever abdominal pain, lightheadedness.

## 2018-10-16 LAB — URINE CULTURE: Culture: 10000 — AB

## 2018-10-17 ENCOUNTER — Telehealth: Payer: Self-pay | Admitting: Emergency Medicine

## 2018-10-17 NOTE — Telephone Encounter (Signed)
Post ED Visit - Positive Culture Follow-up  Culture report reviewed by antimicrobial stewardship pharmacist: [facility] Team []  Elenor Quinones, Pharm.D. []  Heide Guile, Pharm.D., BCPS AQ-ID []  Parks Neptune, Pharm.D., BCPS []  Alycia Rossetti, Pharm.D., BCPS []  [place], Pharm.D., BCPS, AAHIVP []  Legrand Como, Pharm.D., BCPS, AAHIVP []  Salome Arnt, PharmD, BCPS [x]  Elicia Lamp, PharmD, BCPS []  Hughes Better, PharmD, BCPS []  Leeroy Cha, PharmD []  Laqueta Linden, PharmD, BCPS []  Albertina Parr, PharmD  [facility] Team []  Leodis Sias, PharmD []  Lindell Spar, PharmD []  Royetta Asal, PharmD []  Graylin Shiver, Rph []  Rema Fendt) Glennon Mac, PharmD []  Arlyn Dunning, PharmD []  Netta Cedars, PharmD []  Dia Sitter, PharmD []  Leone Haven, PharmD []  Gretta Arab, PharmD []  Theodis Shove, PharmD []  Peggyann Juba, PharmD []  Reuel Boom, PharmD   Positive urine culture No further patient follow-up is required at this time.  [name] 10/17/2018, 3:37 PM

## 2018-10-25 ENCOUNTER — Telehealth: Payer: Self-pay

## 2018-10-25 NOTE — Telephone Encounter (Signed)
Noted  

## 2018-10-25 NOTE — Telephone Encounter (Signed)
Mrs Lunden [facility] signed) said that pt has not urinated in 2 wks; abdominal pain and swelling; pt is SOB and pt said he is not sure if he is going to ED or not.Gentry Fitz NP said pt does need to go to ED and possibly get Foley. Pt and Mrs Coxe declined calling 911. Pt seen in Ed on 10/14/18 but did not FU with urology. Explained importance of pt going to ED for eval and treatment. Mrs Ackers and daughter are going to continue to try to get pt to go to ED. FYI to Gentry Fitz NP.

## 2018-10-25 NOTE — Telephone Encounter (Signed)
Spoken and notified patient's wife of Tawni Millers comments. Patient's wife stated that patient refused to go to ED until tomorrow morning. I told her that he get worse to please call 911. She verbalized understanding.

## 2018-10-25 NOTE — Telephone Encounter (Signed)
Agree the patient needs immediate medical attention in the ED if he has not voided in 2 weeks. Agree with nursing triage. Please call patient and notify him that I would like for him to go to the emergency department immediately.

## 2018-10-26 ENCOUNTER — Other Ambulatory Visit: Payer: Self-pay

## 2018-10-26 ENCOUNTER — Inpatient Hospital Stay (HOSPITAL_COMMUNITY)
Admission: EM | Admit: 2018-10-26 | Discharge: 2018-10-29 | DRG: 682 | Disposition: A | Discharge status: Home Health | Payer: Medicare HMO | Attending: Internal Medicine | Admitting: Internal Medicine

## 2018-10-26 ENCOUNTER — Encounter (HOSPITAL_COMMUNITY): Payer: Self-pay

## 2018-10-26 DIAGNOSIS — N433 Hydrocele, unspecified: Secondary | ICD-10-CM | POA: Diagnosis present

## 2018-10-26 DIAGNOSIS — Z79899 Other long term (current) drug therapy: Secondary | ICD-10-CM

## 2018-10-26 DIAGNOSIS — N329 Bladder disorder, unspecified: Secondary | ICD-10-CM | POA: Diagnosis present

## 2018-10-26 DIAGNOSIS — G9341 Metabolic encephalopathy: Secondary | ICD-10-CM | POA: Diagnosis present

## 2018-10-26 DIAGNOSIS — R7303 Prediabetes: Secondary | ICD-10-CM | POA: Diagnosis present

## 2018-10-26 DIAGNOSIS — N200 Calculus of kidney: Secondary | ICD-10-CM | POA: Diagnosis present

## 2018-10-26 DIAGNOSIS — Z1159 Encounter for screening for other viral diseases: Secondary | ICD-10-CM

## 2018-10-26 DIAGNOSIS — N179 Acute kidney failure, unspecified: Secondary | ICD-10-CM | POA: Diagnosis not present

## 2018-10-26 DIAGNOSIS — I4510 Unspecified right bundle-branch block: Secondary | ICD-10-CM | POA: Diagnosis present

## 2018-10-26 DIAGNOSIS — I10 Essential (primary) hypertension: Secondary | ICD-10-CM | POA: Diagnosis present

## 2018-10-26 DIAGNOSIS — R739 Hyperglycemia, unspecified: Secondary | ICD-10-CM | POA: Diagnosis present

## 2018-10-26 DIAGNOSIS — E872 Acidosis: Secondary | ICD-10-CM | POA: Diagnosis present

## 2018-10-26 DIAGNOSIS — R310 Gross hematuria: Secondary | ICD-10-CM | POA: Diagnosis present

## 2018-10-26 DIAGNOSIS — N3289 Other specified disorders of bladder: Secondary | ICD-10-CM

## 2018-10-26 DIAGNOSIS — E878 Other disorders of electrolyte and fluid balance, not elsewhere classified: Secondary | ICD-10-CM | POA: Diagnosis not present

## 2018-10-26 DIAGNOSIS — F0390 Unspecified dementia without behavioral disturbance: Secondary | ICD-10-CM | POA: Diagnosis present

## 2018-10-26 DIAGNOSIS — Z825 Family history of asthma and other chronic lower respiratory diseases: Secondary | ICD-10-CM

## 2018-10-26 DIAGNOSIS — E785 Hyperlipidemia, unspecified: Secondary | ICD-10-CM | POA: Diagnosis not present

## 2018-10-26 DIAGNOSIS — Z20828 Contact with and (suspected) exposure to other viral communicable diseases: Secondary | ICD-10-CM | POA: Diagnosis not present

## 2018-10-26 DIAGNOSIS — R338 Other retention of urine: Secondary | ICD-10-CM | POA: Diagnosis not present

## 2018-10-26 DIAGNOSIS — K219 Gastro-esophageal reflux disease without esophagitis: Secondary | ICD-10-CM | POA: Diagnosis present

## 2018-10-26 DIAGNOSIS — I252 Old myocardial infarction: Secondary | ICD-10-CM | POA: Diagnosis not present

## 2018-10-26 DIAGNOSIS — Z955 Presence of coronary angioplasty implant and graft: Secondary | ICD-10-CM

## 2018-10-26 DIAGNOSIS — E875 Hyperkalemia: Secondary | ICD-10-CM | POA: Diagnosis not present

## 2018-10-26 DIAGNOSIS — N401 Enlarged prostate with lower urinary tract symptoms: Secondary | ICD-10-CM | POA: Diagnosis not present

## 2018-10-26 DIAGNOSIS — I2510 Atherosclerotic heart disease of native coronary artery without angina pectoris: Secondary | ICD-10-CM | POA: Diagnosis not present

## 2018-10-26 DIAGNOSIS — R339 Retention of urine, unspecified: Secondary | ICD-10-CM | POA: Diagnosis not present

## 2018-10-26 DIAGNOSIS — Z87891 Personal history of nicotine dependence: Secondary | ICD-10-CM

## 2018-10-26 DIAGNOSIS — Z7982 Long term (current) use of aspirin: Secondary | ICD-10-CM

## 2018-10-26 DIAGNOSIS — Z792 Long term (current) use of antibiotics: Secondary | ICD-10-CM

## 2018-10-26 DIAGNOSIS — E870 Hyperosmolality and hypernatremia: Secondary | ICD-10-CM | POA: Diagnosis not present

## 2018-10-26 DIAGNOSIS — Z9049 Acquired absence of other specified parts of digestive tract: Secondary | ICD-10-CM

## 2018-10-26 DIAGNOSIS — E8729 Other acidosis: Secondary | ICD-10-CM

## 2018-10-26 LAB — URINALYSIS, ROUTINE W REFLEX MICROSCOPIC
Bilirubin Urine: NEGATIVE
Glucose, UA: 50 mg/dL — AB
Ketones, ur: NEGATIVE mg/dL
Nitrite: NEGATIVE
Protein, ur: 100 mg/dL — AB
RBC / HPF: >50 RBC/hpf — ABNORMAL HIGH (ref 0–5)
Specific Gravity, Urine: 1.012 (ref 1.005–1.030)
pH: 5 (ref 5.0–8.0)

## 2018-10-26 LAB — COMPREHENSIVE METABOLIC PANEL
ALT: 34 U/L (ref 0–44)
ALT: 35 U/L (ref 0–44)
AST: 24 U/L (ref 15–41)
AST: 25 U/L (ref 15–41)
Albumin: 2.9 g/dL — ABNORMAL LOW (ref 3.5–5.0)
Albumin: 2.7 g/dL — ABNORMAL LOW (ref 3.5–5.0)
Alkaline Phosphatase: 69 U/L (ref 38–126)
Alkaline Phosphatase: 68 U/L (ref 38–126)
Anion gap: 30 — ABNORMAL HIGH (ref 5–15)
Anion gap: 19 — ABNORMAL HIGH (ref 5–15)
BUN: 243 mg/dL — ABNORMAL HIGH (ref 8–23)
BUN: 212 mg/dL — ABNORMAL HIGH (ref 8–23)
CO2: 9 mmol/L — ABNORMAL LOW (ref 22–32)
CO2: 15 mmol/L — ABNORMAL LOW (ref 22–32)
Calcium: 8.9 mg/dL (ref 8.9–10.3)
Calcium: 9.2 mg/dL (ref 8.9–10.3)
Chloride: 100 mmol/L (ref 98–111)
Chloride: 106 mmol/L (ref 98–111)
Creatinine, Ser: 21.49 mg/dL — ABNORMAL HIGH (ref 0.61–1.24)
Creatinine, Ser: 17.29 mg/dL — ABNORMAL HIGH (ref 0.61–1.24)
GFR calc Af Amer: 2 mL/min — ABNORMAL LOW (ref >60)
GFR calc Af Amer: 3 mL/min — ABNORMAL LOW (ref >60)
GFR calc non Af Amer: 2 mL/min — ABNORMAL LOW (ref >60)
GFR calc non Af Amer: 2 mL/min — ABNORMAL LOW (ref >60)
Glucose, Bld: 126 mg/dL — ABNORMAL HIGH (ref 70–99)
Glucose, Bld: 174 mg/dL — ABNORMAL HIGH (ref 70–99)
Potassium: 7 mmol/L (ref 3.5–5.1)
Potassium: 5.9 mmol/L — ABNORMAL HIGH (ref 3.5–5.1)
Sodium: 139 mmol/L (ref 135–145)
Sodium: 140 mmol/L (ref 135–145)
Total Bilirubin: 0.5 mg/dL (ref 0.3–1.2)
Total Bilirubin: 0.6 mg/dL (ref 0.3–1.2)
Total Protein: 6.5 g/dL (ref 6.5–8.1)
Total Protein: 6.5 g/dL (ref 6.5–8.1)

## 2018-10-26 LAB — CBC WITH DIFFERENTIAL/PLATELET
Abs Immature Granulocytes: 0.18 10*3/uL — ABNORMAL HIGH (ref 0.00–0.07)
Basophils Absolute: 0 10*3/uL (ref 0.0–0.1)
Basophils Relative: 0 %
Eosinophils Absolute: 0 10*3/uL (ref 0.0–0.5)
Eosinophils Relative: 0 %
HCT: 39.3 % (ref 39.0–52.0)
Hemoglobin: 13 g/dL (ref 13.0–17.0)
Immature Granulocytes: 1 %
Lymphocytes Relative: 3 %
Lymphs Abs: 0.5 10*3/uL — ABNORMAL LOW (ref 0.7–4.0)
MCH: 29.3 pg (ref 26.0–34.0)
MCHC: 33.1 g/dL (ref 30.0–36.0)
MCV: 88.5 fL (ref 80.0–100.0)
Monocytes Absolute: 0.8 10*3/uL (ref 0.1–1.0)
Monocytes Relative: 5 %
Neutro Abs: 14.9 10*3/uL — ABNORMAL HIGH (ref 1.7–7.7)
Neutrophils Relative %: 91 %
Platelets: 283 10*3/uL (ref 150–400)
RBC: 4.44 MIL/uL (ref 4.22–5.81)
RDW: 13 % (ref 11.5–15.5)
WBC: 16.4 10*3/uL — ABNORMAL HIGH (ref 4.0–10.5)
nRBC: 0 % (ref 0.0–0.2)

## 2018-10-26 LAB — BASIC METABOLIC PANEL
Anion gap: 18 — ABNORMAL HIGH (ref 5–15)
BUN: 153 mg/dL — ABNORMAL HIGH (ref 8–23)
CO2: 14 mmol/L — ABNORMAL LOW (ref 22–32)
Calcium: 9 mg/dL (ref 8.9–10.3)
Chloride: 115 mmol/L — ABNORMAL HIGH (ref 98–111)
Creatinine, Ser: 10.99 mg/dL — ABNORMAL HIGH (ref 0.61–1.24)
GFR calc Af Amer: 5 mL/min — ABNORMAL LOW (ref >60)
GFR calc non Af Amer: 4 mL/min — ABNORMAL LOW (ref >60)
Glucose, Bld: 158 mg/dL — ABNORMAL HIGH (ref 70–99)
Potassium: 4.7 mmol/L (ref 3.5–5.1)
Sodium: 147 mmol/L — ABNORMAL HIGH (ref 135–145)

## 2018-10-26 LAB — LIPASE, BLOOD: Lipase: 78 U/L — ABNORMAL HIGH (ref 11–51)

## 2018-10-26 LAB — CBG MONITORING, ED
Glucose-Capillary: 137 mg/dL — ABNORMAL HIGH (ref 70–99)
Glucose-Capillary: 156 mg/dL — ABNORMAL HIGH (ref 70–99)

## 2018-10-26 LAB — SARS CORONAVIRUS 2 BY RT PCR (HOSPITAL ORDER, PERFORMED IN ~~LOC~~ HOSPITAL LAB): SARS Coronavirus 2: NEGATIVE

## 2018-10-26 MED ORDER — SODIUM CHLORIDE 0.9% FLUSH
3.0000 mL | Freq: Once | INTRAVENOUS | Status: AC
  Administered 2018-10-26: 14:00:00 3 mL via INTRAVENOUS

## 2018-10-26 MED ORDER — SODIUM ZIRCONIUM CYCLOSILICATE 10 G PO PACK
10.0000 g | PACK | Freq: Once | ORAL | Status: AC
  Administered 2018-10-26: 16:00:00 10 g via ORAL
  Filled 2018-10-26: qty 1

## 2018-10-26 MED ORDER — POLYETHYLENE GLYCOL 3350 17 G PO PACK: 17.0000 g | PACK | Freq: Every day | ORAL | Status: DC | PRN

## 2018-10-26 MED ORDER — INSULIN ASPART 100 UNIT/ML IV SOLN
5.0000 [IU] | Freq: Once | INTRAVENOUS | Status: AC
  Administered 2018-10-26: 15:00:00 5 [IU] via INTRAVENOUS

## 2018-10-26 MED ORDER — SODIUM CHLORIDE 0.9 % IV BOLUS
1000.0000 mL | Freq: Once | INTRAVENOUS | Status: AC
  Administered 2018-10-26: 16:00:00 1000 mL via INTRAVENOUS

## 2018-10-26 MED ORDER — SODIUM ZIRCONIUM CYCLOSILICATE 10 G PO PACK
10.0000 g | PACK | Freq: Three times a day (TID) | ORAL | Status: DC
  Administered 2018-10-26: 10 g via ORAL
  Filled 2018-10-26 (×2): qty 1

## 2018-10-26 MED ORDER — ONDANSETRON HCL 4 MG/2ML IJ SOLN: 4.0000 mg | Freq: Four times a day (QID) | INTRAMUSCULAR | Status: DC | PRN

## 2018-10-26 MED ORDER — ONDANSETRON HCL 4 MG PO TABS: 4.0000 mg | ORAL_TABLET | Freq: Four times a day (QID) | ORAL | Status: DC | PRN

## 2018-10-26 MED ORDER — CALCIUM GLUCONATE 10 % IV SOLN
1.0000 g | Freq: Once | INTRAVENOUS | Status: AC
  Administered 2018-10-26: 15:00:00 1 g via INTRAVENOUS
  Filled 2018-10-26: qty 10

## 2018-10-26 MED ORDER — ACETAMINOPHEN 650 MG RE SUPP: 650.0000 mg | Freq: Four times a day (QID) | RECTAL | Status: DC | PRN

## 2018-10-26 MED ORDER — SODIUM BICARBONATE 8.4 % IV SOLN
INTRAVENOUS | Status: DC
  Administered 2018-10-26 – 2018-10-27 (×2): via INTRAVENOUS
  Filled 2018-10-26 (×3): qty 150

## 2018-10-26 MED ORDER — SIMVASTATIN 20 MG PO TABS
10.0000 mg | ORAL_TABLET | Freq: Every day | ORAL | Status: DC
  Administered 2018-10-26 – 2018-10-28 (×2): 10 mg via ORAL
  Filled 2018-10-26 (×3): qty 1

## 2018-10-26 MED ORDER — METOPROLOL TARTRATE 50 MG PO TABS
50.0000 mg | ORAL_TABLET | Freq: Two times a day (BID) | ORAL | Status: DC
  Administered 2018-10-26 – 2018-10-29 (×5): 50 mg via ORAL
  Filled 2018-10-26 (×6): qty 1

## 2018-10-26 MED ORDER — SODIUM CHLORIDE 0.9% FLUSH
3.0000 mL | Freq: Two times a day (BID) | INTRAVENOUS | Status: DC
  Administered 2018-10-29: 3 mL via INTRAVENOUS

## 2018-10-26 MED ORDER — ACETAMINOPHEN 325 MG PO TABS: 650.0000 mg | ORAL_TABLET | Freq: Four times a day (QID) | ORAL | Status: DC | PRN

## 2018-10-26 MED ORDER — DEXTROSE 50 % IV SOLN
1.0000 | Freq: Once | INTRAVENOUS | Status: AC
  Administered 2018-10-26: 15:00:00 50 mL via INTRAVENOUS
  Filled 2018-10-26: qty 50

## 2018-10-26 NOTE — ED Notes (Signed)
Daughter Fabio Neighbors waiting outside.  Phone number [phone]  Daughter Winifred Olive  [phone]

## 2018-10-26 NOTE — H&P (Signed)
History and Physical  [name] NUU:[id number] DOB: Aug 11, 1935 DOA: 10/26/2018  PCP: Pleas Koch, NP   Chief Complaint: Difficulty urinating  HPI:  83 year old man PMH coronary artery disease, hyperlipidemia, hypertension, seen in the emergency department 7/2 for dysuria and hematuria, CT at that time showed a possible bladder versus prostate mass.  At discharge from the emergency department creatinine was 1.11.  PRESENTED 7/14 with 2-week history of difficulty urinating, poor liquid intake.  No specific aggravating or alleviating factors voiced.  Associated with some hematuria.  ED Course: Creatinine 21.  Foley catheter placed with return of 800 cc of dark urine.  Treated with calcium, insulin and glucose.  Seen by nephrology with plan for conservative management at this point.  Review of Systems:  Negative for fever, visual changes, sore throat, rash, new muscle aches, chest pain, SOB, n/v/abdominal pain.  Past Medical History:  Diagnosis Date   Cataract 2020   bilateral cataracts   Coronary artery disease    Epididymitis    Left   GERD (gastroesophageal reflux disease)    Hyperlipidemia    Hypertension    MI (myocardial infarction) ([place])    first at age 42   Routine general medical examination at a health care facility    Skin lesions, generalized     Past Surgical History:  Procedure Laterality Date   APPENDECTOMY     CARDIAC CATHETERIZATION     MI x4 with stents    CHOLECYSTECTOMY     HIATAL HERNIA REPAIR     INGUINAL HERNIA REPAIR     TONSILLECTOMY       reports that he quit smoking about 32 years ago. He has a 30.00 pack-year smoking history. He has never used smokeless tobacco. He reports that he does not drink alcohol or use drugs.  No Known Allergies  Family History  Problem Relation Age of Onset   Asthma Mother      Prior to Admission medications   Medication Sig Start Date End Date Taking? Authorizing Provider    aspirin EC 81 MG tablet Take 81 mg by mouth daily.   Yes [provider]  metoprolol tartrate (LOPRESSOR) 50 MG tablet TAKE 1 TABLET BY MOUTH TWICE DAILY. PLEASE KEEP UPCOMING APPT IN SEPTEMBER BEFORE ANYMORE REFILLS. Patient taking differently: Take 50 mg by mouth 2 (two) times daily.  03/08/18  Yes Evans Lance, MD  ramipril (ALTACE) 2.5 MG capsule Take 1 capsule (2.5 mg total) by mouth daily. 03/15/18  Yes Evans Lance, MD  simvastatin (ZOCOR) 10 MG tablet TAKE 1 TABLET BY MOUTH AT BEDTIME. PLEASE KEEP UPCOMING APPT IN SEPTEMBER BEFORE ANYMORE REFILLS. Patient taking differently: Take 10 mg by mouth at bedtime.  04/12/18  Yes Evans Lance, MD  sulfamethoxazole-trimethoprim (BACTRIM DS) 800-160 MG tablet Take 1 tablet by mouth 2 (two) times daily. Patient taking differently: Take 0.5-1 tablets by mouth 2 (two) times daily.  09/17/18  Yes Ria Bush, MD    Physical Exam: Vitals:   10/26/18 1900 10/26/18 1915  BP: 137/65 127/67  Pulse:    Resp: 17 17  Temp:    SpO2:      Constitutional:    Appears calm, uncomfortable, nontoxic Eyes:   pupils and irises appear normal, wears glasses  Grossly normal lids ENMT:   grossly normal hearing   Lips appear normal Respiratory:   CTA bilaterally, no w/r/r.   Respiratory effort normal.  Cardiovascular:   RRR, no m/r/g  No LE extremity edema  Abdomen:   Soft, nontender, nondistended Musculoskeletal:   RUE, LUE, RLE, LLE   o strength and tone normal, no atrophy, no abnormal movements o No tenderness, masses Skin:   No rashes, lesions, ulcers noted  palpation of skin: no induration or nodules Neurologic:   Grossly unremarkable Psychiatric:   Mental status o Mood, affect appropriate  judgment and insight difficult to gauge   I have personally reviewed following labs and imaging studies  Labs:  SARS-CoV-2 negative Potassium 7.0 > 5.9 Creatinine 21.49 > 17.29 Anion gap 30 > 19 CO2 9 >  15 LFTs unremarkable WBC 16.4, remainder CBC unremarkable  Imaging studies:   7/2 CT renal study showed an abnormal bladder with differential including bladder mass, blood clot or prostate mass invading the bladder.  Medical tests:   EKG independently reviewed: Sinus rhythm, right bundle branch block, old inferior MI, poor quality study.  No acute changes seen.  No significant change since last test 01/26/2018.  Principal Problem:   AKI (acute kidney injury) ([place]) Active Problems:   Hyperkalemia   High anion gap metabolic acidosis   Bladder mass   Assessment/Plan Severe acute kidney injury with hyperkalemia and metabolic acidosis complicated by ACE inhibitor use --Presumed secondary to obstructive mass --Foley in place draining urine, no large clots seen --Hyperkalemia treated in the emergency department with calcium insulin and dextrose. --Repeat potassium and renal function show improvement --Nephrology plans medical management at this point with bicarbonate infusion, lokelma --Telemetry.  EKG no acute changes.  Obstructing bladder mass --Foley catheter mitigating at this point --Outpatient follow-up with urology.  If develops worsening clots then inpatient consultation will be pursued  Leukocytosis --Likely stress response, check CBC in a.m.  Coronary artery disease --Asymptomatic.  Continue beta-blocker.  Hyperlipidemia --Continue statin  Essential hypertension --Stable.  Hold ramipril.  Continue metoprolol.  Severity of Illness: The appropriate patient status for this patient is INPATIENT. Inpatient status is judged to be reasonable and necessary in order to provide the required intensity of service to ensure the patient's safety. The patient's presenting symptoms, physical exam findings, and initial radiographic and laboratory data in the context of their chronic comorbidities is felt to place them at high risk for further clinical deterioration. Furthermore, it is  not anticipated that the patient will be medically stable for discharge from the hospital within 2 midnights of admission. The following factors support the patient status of inpatient.   " The patient's presenting symptoms include difficulty urinating. " The worrisome physical exam findings include hematuria. " The initial radiographic and laboratory data are worrisome because of severe acute kidney injury with hyperkalemia and anion gap metabolic acidosis secondary to obstructing bladder mass. " The chronic co-morbidities include coronary artery disease, hypertension, hyperlipidemia.  * I certify that at the point of admission it is my clinical judgment that the patient will require inpatient hospital care spanning beyond 2 midnights from the point of admission due to high intensity of service, high risk for further deterioration and high frequency of surveillance required.*  DVT prophylaxis: SCDs Code Status: limited: CPR ok. DNI. Family Communication: discussed with wife by telephone Consults called: nephrology     Time spent: 24minutes  Murray Hodgkins, MD  [facility] Direct contact: see [email or web address]  7PM-7AM contact night coverage as below   1. Check the care team in [facility] and look for a) attending/consulting [facility] provider listed and b) the [facility] team listed 2. Log into [email or web address] and use [facility]'s universal password to access. If you  do not have the password, please contact the hospital operator. 3. Locate the [facility] provider you are looking for under [facility] and page to a number that you can be directly reached. 4. If you still have difficulty reaching the provider, please page the [facility] (Director on Call) for the Hospitalists listed on amion for assistance.   10/26/2018, 7:38 PM

## 2018-10-26 NOTE — ED Triage Notes (Signed)
Pt here for diarrhea and urinary retention for the past few days, pt unable to tell me when the last time he urinated. Pt denies abd pain but abd very distended and tender to touch. Pt denies any n/v

## 2018-10-26 NOTE — Progress Notes (Signed)
New Admission Note:   Arrival Method: Arrived from [facility] ED via stetcher Mental Orientation: Alert and oriented to person and place Telemetry: Box #14 Assessment: Completed Skin: See doc flowsheet IV: Rt AC Pain: Denies Tubes: Foley catheter Safety Measures: Safety Fall Prevention Plan has been discussed.  Admission: Completed 5MW Orientation: Patient has been orientated to the room, unit and staff.  Family: None at bedside  Orders have been reviewed and implemented. Will continue to monitor the patient. Call light has been placed within reach and bed alarm has been activated.   Keyera Hattabaugh [employer], RN-BC Phone number: [phone]

## 2018-10-26 NOTE — Progress Notes (Signed)
Received report from ED RN. Room ready for patient. Yeng Perz Joselita, RN 

## 2018-10-26 NOTE — ED Notes (Signed)
RN Alexa notified of CBG reading of 137.

## 2018-10-26 NOTE — Consult Note (Signed)
[facility]  INPATIENT CONSULTATION  Reason for Consultation: AKI, hyperkalemia Requesting Provider: Dr. Stark Jock  HPI: [name] is an 83 y.o. male with CAD h/o remote MI, HTN, HL, GERD who is seen for evaluation and management of AKI.    Pt presented to [facility] ED 7/2 for dysuria and hematuria.  Eval included CT showing possible bladder v prostate mass.  He was discharged for outpt evaluation.  Cr 1.11 at that time.    He presented to the ED today with 2 wk h/o minimal voiding -- he cannot exactly quantify but says strains to go and little comes out.  Denies nausea, emesis.  No fevers, chills, + wt loss unspecified amount.  Denies dyspnea, chest pain, edema. Besides hematuria, no bleeding.  Labs now with BUN 243, Cr 22, BUN 9.  Foley inserted and drained immediately ~842mL bloody urine.    PMH: Past Medical History:  Diagnosis Date  . Cataract 2020   bilateral cataracts  . Coronary artery disease   . Epididymitis    Left  . GERD (gastroesophageal reflux disease)   . Hyperlipidemia   . Hypertension   . MI (myocardial infarction) ([place])    first at age 76  . Routine general medical examination at a health care facility   . Skin lesions, generalized    PSH: Past Surgical History:  Procedure Laterality Date  . APPENDECTOMY    . CARDIAC CATHETERIZATION     MI x4 with stents   . CHOLECYSTECTOMY    . HIATAL HERNIA REPAIR    . INGUINAL HERNIA REPAIR    . TONSILLECTOMY      Past Medical History:  Diagnosis Date  . Cataract 2020   bilateral cataracts  . Coronary artery disease   . Epididymitis    Left  . GERD (gastroesophageal reflux disease)   . Hyperlipidemia   . Hypertension   . MI (myocardial infarction) ([place])    first at age 54  . Routine general medical examination at a health care facility   . Skin lesions, generalized     Medications:  I have reviewed the patient's current medications.  (Not in a hospital admission)   ALLERGIES:  No  Known Allergies  FAM HX: Family History  Problem Relation Age of Onset  . Asthma Mother     Social History:   reports that he quit smoking about 32 years ago. He has a 30.00 pack-year smoking history. He has never used smokeless tobacco. He reports that he does not drink alcohol or use drugs.  ROS: 12 system per HPI above  Blood pressure (!) 168/72, pulse (!) 101, temperature (!) 97.3 F (36.3 C), temperature source Oral, resp. rate (!) 22, SpO2 98 %. PHYSICAL EXAM: Gen: thin elderly man lying flat on stretcher.   Eyes: anicteric, glasses ENT: MM dry Neck: no JVD CV:  RRR Abd:  Soft mildly distended Lungs: clear  GU: foley with grossly bloody urine, small clots Extr: no edema Neuro: grossly nonfocal Skin: no rashes or lesions   Results for orders placed or performed during the hospital encounter of 10/26/18 (from the past 48 hour(s))  Lipase, blood     Status: Abnormal   Collection Time: 10/26/18 12:40 PM  Result Value Ref Range   Lipase 78 (H) 11 - 51 U/L    Comment: Performed at [facility], [place] [address] [place], [place] [address]  Comprehensive metabolic panel     Status: Abnormal   Collection Time: 10/26/18  12:40 PM  Result Value Ref Range   Sodium 139 135 - 145 mmol/L   Potassium 7.0 (HH) 3.5 - 5.1 mmol/L    Comment: NO VISIBLE HEMOLYSIS CRITICAL RESULT CALLED TO, READ BACK BY AND VERIFIED WITH: SANCHEZ,A RN @1353  ON [id number] BY FLEMINGS    Chloride 100 98 - 111 mmol/L   CO2 9 (L) 22 - 32 mmol/L   Glucose, Bld 126 (H) 70 - 99 mg/dL   BUN 243 (H) 8 - 23 mg/dL   Creatinine, Ser 21.49 (H) 0.61 - 1.24 mg/dL   Calcium 8.9 8.9 - 10.3 mg/dL   Total Protein 6.5 6.5 - 8.1 g/dL   Albumin 2.9 (L) 3.5 - 5.0 g/dL   AST 24 15 - 41 U/L   ALT 34 0 - 44 U/L   Alkaline Phosphatase 69 38 - 126 U/L   Total Bilirubin 0.5 0.3 - 1.2 mg/dL   GFR calc non Af Amer 2 (L) >60 mL/min   GFR calc Af Amer 2 (L) >60 mL/min   Anion gap 30 (H) 5 - 15    Comment: Performed at  [facility], [place] [address], [place], [place] [address]  CBC with Differential     Status: Abnormal   Collection Time: 10/26/18 12:40 PM  Result Value Ref Range   WBC 16.4 (H) 4.0 - 10.5 K/uL   RBC 4.44 4.22 - 5.81 MIL/uL   Hemoglobin 13.0 13.0 - 17.0 g/dL   HCT 39.3 39.0 - 52.0 %   MCV 88.5 80.0 - 100.0 fL   MCH 29.3 26.0 - 34.0 pg   MCHC 33.1 30.0 - 36.0 g/dL   RDW 13.0 11.5 - 15.5 %   Platelets 283 150 - 400 K/uL   nRBC 0.0 0.0 - 0.2 %   Neutrophils Relative % 91 %   Neutro Abs 14.9 (H) 1.7 - 7.7 K/uL   Lymphocytes Relative 3 %   Lymphs Abs 0.5 (L) 0.7 - 4.0 K/uL   Monocytes Relative 5 %   Monocytes Absolute 0.8 0.1 - 1.0 K/uL   Eosinophils Relative 0 %   Eosinophils Absolute 0.0 0.0 - 0.5 K/uL   Basophils Relative 0 %   Basophils Absolute 0.0 0.0 - 0.1 K/uL   Immature Granulocytes 1 %   Abs Immature Granulocytes 0.18 (H) 0.00 - 0.07 K/uL    Comment: Performed at [facility] [address] [place], [place] [address]  Urinalysis, Routine w reflex microscopic     Status: Abnormal   Collection Time: 10/26/18  2:30 PM  Result Value Ref Range   Color, Urine AMBER (A) YELLOW    Comment: BIOCHEMICALS MAY BE AFFECTED BY COLOR   APPearance TURBID (A) CLEAR   Specific Gravity, Urine 1.012 1.005 - 1.030   pH 5.0 5.0 - 8.0   Glucose, UA 50 (A) NEGATIVE mg/dL   Hgb urine dipstick LARGE (A) NEGATIVE   Bilirubin Urine NEGATIVE NEGATIVE   Ketones, ur NEGATIVE NEGATIVE mg/dL   Protein, ur 100 (A) NEGATIVE mg/dL   Nitrite NEGATIVE NEGATIVE   Leukocytes,Ua SMALL (A) NEGATIVE   RBC / HPF >50 (H) 0 - 5 RBC/hpf   WBC, UA 6-10 0 - 5 WBC/hpf   Bacteria, UA FEW (A) NONE SEEN    Comment: Performed at [facility], [address] [place], [place] [address]  CBG monitoring, ED     Status: Abnormal   Collection Time: 10/26/18  2:52 PM  Result Value Ref Range  Glucose-Capillary 137 (H) 70 - 99 mg/dL    No results found.  Assessment/Plan **AKI:  Severe.  Cr 1.11  just 12 days ago now BUN 243/Cr 21.  Presumably obstructive etiology given findings consistent with bladder mass on recent imaging and LUTS.  Foley in place draining grossly bloody urine now - no large clots but small clots or debris in tubing though it appears to be draining.  Will attempt to manage medically but d/w patient and family if not improvement could offer dialysis and they are in agreement to proceed if necessary.  For now will bolus 1L NS, start bicarb gtt 125/hr, give lokelma 10g now (being given at 3:45pm bedside) then TID after.  Already given IV calcium, insulin, dextrose.  Recheck K now and again in 3-4 hours.  If not improving would proceed with dialysis.  Hopefully given acuity this will be reversible.   **Hyperkalemia: EKG and tele ok currently. Management per above.   **Metabolic acidosis:  Secondary to AKI.  Bicarb gtt.  **Bladder mass, obstruction: foley in and draining; will need f/u with urology.  If develops worsening clots would need eval inpatient.   **Leukocytosis:  Afebrile, no localizing s/s infection.  May be stress response.  Per admitting team.   **HTN: hold home ACEi  Justin Mend 10/26/2018, 3:47 PM

## 2018-10-26 NOTE — ED Notes (Signed)
Admitting at bedside 

## 2018-10-26 NOTE — ED Notes (Signed)
ED TO INPATIENT HANDOFF REPORT  ED Nurse Name and Phone #: [phone]  S Name/Age/Gender [name] 83 y.o. male Room/Bed: 036C/036C  Code Status   Code Status: Not on file  Home/SNF/Other Home Patient oriented to: self and place Is this baseline? Yes   Triage Complete: Triage complete  Chief Complaint Dehydration  Triage Note Pt here for diarrhea and urinary retention for the past few days, pt unable to tell me when the last time he urinated. Pt denies abd pain but abd very distended and tender to touch. Pt denies any n/v   Allergies No Known Allergies  Level of Care/Admitting Diagnosis ED Disposition    ED Disposition Condition [facility] Area: [place] [[id number]]  Level of Care: Telemetry Medical [104]  Covid Evaluation: Asymptomatic Screening Protocol (No Symptoms)  Diagnosis: AKI (acute kidney injury) ([place]) [485462]  Admitting Physician: [place], [place]  Attending Physician: Samuella Cota [[id number]]  Estimated length of stay: past midnight tomorrow  Certification:: I certify this patient will need inpatient services for at least 2 midnights  PT Class (Do Not Modify): Inpatient [101]  PT Acc Code (Do Not Modify): Private [1]       B Medical/Surgery History Past Medical History:  Diagnosis Date  . Cataract 2020   bilateral cataracts  . Coronary artery disease   . Epididymitis    Left  . GERD (gastroesophageal reflux disease)   . Hyperlipidemia   . Hypertension   . MI (myocardial infarction) ([place])    first at age 25  . Routine general medical examination at a health care facility   . Skin lesions, generalized    Past Surgical History:  Procedure Laterality Date  . APPENDECTOMY    . CARDIAC CATHETERIZATION     MI x4 with stents   . CHOLECYSTECTOMY    . HIATAL HERNIA REPAIR    . INGUINAL HERNIA REPAIR    . TONSILLECTOMY       A IV Location/Drains/Wounds Patient Lines/Drains/Airways Status    Active Line/Drains/Airways    Name:   Placement date:   Placement time:   Site:   Days:   Peripheral IV 10/26/18 Right Antecubital   10/26/18    1417    Antecubital   less than 1   Urethral Catheter Caterra Ostroff, RN [place],RN 16 Fr.   10/26/18    1442    -   less than 1          Intake/Output Last 24 hours  Intake/Output Summary (Last 24 hours) at 10/26/2018 1853 Last data filed at 10/26/2018 1747 Gross per 24 hour  Intake 1000 ml  Output -  Net 1000 ml    Labs/Imaging Results for orders placed or performed during the hospital encounter of 10/26/18 (from the past 48 hour(s))  Lipase, blood     Status: Abnormal   Collection Time: 10/26/18 12:40 PM  Result Value Ref Range   Lipase 78 (H) 11 - 51 U/L    Comment: Performed at [facility], [place] [address], [place], [place] [address]  Comprehensive metabolic panel     Status: Abnormal   Collection Time: 10/26/18 12:40 PM  Result Value Ref Range   Sodium 139 135 - 145 mmol/L   Potassium 7.0 (HH) 3.5 - 5.1 mmol/L    Comment: NO VISIBLE HEMOLYSIS CRITICAL RESULT CALLED TO, READ BACK BY AND VERIFIED WITH: Cayson Kalb,A RN @1353  ON [id number] BY FLEMINGS    Chloride  100 98 - 111 mmol/L   CO2 9 (L) 22 - 32 mmol/L   Glucose, Bld 126 (H) 70 - 99 mg/dL   BUN 243 (H) 8 - 23 mg/dL   Creatinine, Ser 21.49 (H) 0.61 - 1.24 mg/dL   Calcium 8.9 8.9 - 10.3 mg/dL   Total Protein 6.5 6.5 - 8.1 g/dL   Albumin 2.9 (L) 3.5 - 5.0 g/dL   AST 24 15 - 41 U/L   ALT 34 0 - 44 U/L   Alkaline Phosphatase 69 38 - 126 U/L   Total Bilirubin 0.5 0.3 - 1.2 mg/dL   GFR calc non Af Amer 2 (L) >60 mL/min   GFR calc Af Amer 2 (L) >60 mL/min   Anion gap 30 (H) 5 - 15    Comment: Performed at [facility], [place] [address], [place], [place] [address]  CBC with Differential     Status: Abnormal   Collection Time: 10/26/18 12:40 PM  Result Value Ref Range   WBC 16.4 (H) 4.0 - 10.5 K/uL   RBC 4.44 4.22 - 5.81 MIL/uL   Hemoglobin 13.0 13.0 - 17.0 g/dL   HCT  39.3 39.0 - 52.0 %   MCV 88.5 80.0 - 100.0 fL   MCH 29.3 26.0 - 34.0 pg   MCHC 33.1 30.0 - 36.0 g/dL   RDW 13.0 11.5 - 15.5 %   Platelets 283 150 - 400 K/uL   nRBC 0.0 0.0 - 0.2 %   Neutrophils Relative % 91 %   Neutro Abs 14.9 (H) 1.7 - 7.7 K/uL   Lymphocytes Relative 3 %   Lymphs Abs 0.5 (L) 0.7 - 4.0 K/uL   Monocytes Relative 5 %   Monocytes Absolute 0.8 0.1 - 1.0 K/uL   Eosinophils Relative 0 %   Eosinophils Absolute 0.0 0.0 - 0.5 K/uL   Basophils Relative 0 %   Basophils Absolute 0.0 0.0 - 0.1 K/uL   Immature Granulocytes 1 %   Abs Immature Granulocytes 0.18 (H) 0.00 - 0.07 K/uL    Comment: Performed at [facility] [address], [place], [place] [address]  Urinalysis, Routine w reflex microscopic     Status: Abnormal   Collection Time: 10/26/18  2:30 PM  Result Value Ref Range   Color, Urine AMBER (A) YELLOW    Comment: BIOCHEMICALS MAY BE AFFECTED BY COLOR   APPearance TURBID (A) CLEAR   Specific Gravity, Urine 1.012 1.005 - 1.030   pH 5.0 5.0 - 8.0   Glucose, UA 50 (A) NEGATIVE mg/dL   Hgb urine dipstick LARGE (A) NEGATIVE   Bilirubin Urine NEGATIVE NEGATIVE   Ketones, ur NEGATIVE NEGATIVE mg/dL   Protein, ur 100 (A) NEGATIVE mg/dL   Nitrite NEGATIVE NEGATIVE   Leukocytes,Ua SMALL (A) NEGATIVE   RBC / HPF >50 (H) 0 - 5 RBC/hpf   WBC, UA 6-10 0 - 5 WBC/hpf   Bacteria, UA FEW (A) NONE SEEN    Comment: Performed at [facility], [address], [place], [place] [address]  CBG monitoring, ED     Status: Abnormal   Collection Time: 10/26/18  2:52 PM  Result Value Ref Range   Glucose-Capillary 137 (H) 70 - 99 mg/dL  Comprehensive metabolic panel     Status: Abnormal   Collection Time: 10/26/18  4:29 PM  Result Value Ref Range   Sodium 140 135 - 145 mmol/L   Potassium 5.9 (H) 3.5 - 5.1 mmol/L   Chloride 106 98 -  111 mmol/L   CO2 15 (L) 22 - 32 mmol/L   Glucose, Bld 174 (H) 70 - 99 mg/dL   BUN 212 (H) 8 - 23 mg/dL   Creatinine, Ser 17.29 (H) 0.61 -  1.24 mg/dL   Calcium 9.2 8.9 - 10.3 mg/dL   Total Protein 6.5 6.5 - 8.1 g/dL   Albumin 2.7 (L) 3.5 - 5.0 g/dL   AST 25 15 - 41 U/L   ALT 35 0 - 44 U/L   Alkaline Phosphatase 68 38 - 126 U/L   Total Bilirubin 0.6 0.3 - 1.2 mg/dL   GFR calc non Af Amer 2 (L) >60 mL/min   GFR calc Af Amer 3 (L) >60 mL/min   Anion gap 19 (H) 5 - 15    Comment: Performed at [facility] [address], [place], [place] [address]  CBG monitoring, ED     Status: Abnormal   Collection Time: 10/26/18  4:33 PM  Result Value Ref Range   Glucose-Capillary 156 (H) 70 - 99 mg/dL   No results found.  Pending Labs Unresulted Labs (From admission, onward)    Start     Ordered   10/27/18 0500  Renal function panel  Tomorrow morning,   R     10/26/18 1555   10/27/18 0500  CBC  Tomorrow morning,   R     10/26/18 1555   10/26/18 6045  Basic metabolic panel  Once,   STAT     10/26/18 1555   10/26/18 1708  SARS Coronavirus 2 (CEPHEID - Performed in [facility] hospital lab), Hosp Order  (Asymptomatic Patients Labs)  Once,   STAT    Question:  Rule Out  Answer:  Yes   10/26/18 1707   10/26/18 1255  Urine culture  ONCE - STAT,   STAT     10/26/18 1255          Vitals/Pain Today's Vitals   10/26/18 1400 10/26/18 1440 10/26/18 1615 10/26/18 1800  BP: (!) 131/50 (!) 168/72 133/69 132/63  Pulse: 100 (!) 101  100  Resp:  (!) 22 18 18   Temp:      TempSrc:      SpO2: 97% 98% 98% 99%  PainSc:        Isolation Precautions No active isolations  Medications Medications  sodium bicarbonate 150 mEq in dextrose 5 % 1,000 mL infusion ( Intravenous New Bag/Given 10/26/18 1806)  sodium zirconium cyclosilicate (LOKELMA) packet 10 g (has no administration in time range)  sodium chloride flush (NS) 0.9 % injection 3 mL (3 mLs Intravenous Given 10/26/18 1418)  calcium gluconate inj 10% (1 g) URGENT USE ONLY! (1 g Intravenous Given 10/26/18 1454)  insulin aspart (novoLOG) injection 5 Units (5 Units Intravenous Given  10/26/18 1459)    And  dextrose 50 % solution 50 mL (50 mLs Intravenous Given 10/26/18 1500)  sodium chloride 0.9 % bolus 1,000 mL (0 mLs Intravenous Stopped 10/26/18 1747)  sodium zirconium cyclosilicate (LOKELMA) packet 10 g (10 g Oral Given 10/26/18 1532)    Mobility walks with device     Focused Assessments Cardiac Assessment Handoff:  Cardiac Rhythm: Normal sinus rhythm Lab Results  Component Value Date   CKTOTAL 49 11/16/2007   No results found for: DDIMER Does the Patient currently have chest pain? No     R Recommendations: See Admitting Provider Note  Report given to:   Additional Notes:

## 2018-10-26 NOTE — ED Provider Notes (Signed)
[facility] EMERGENCY DEPARTMENT Provider Note   CSN: [id number] Arrival date & time: 10/26/18  1222    History   Chief Complaint Chief Complaint  Patient presents with  . Diarrhea  . Urinary Retention    HPI [name] is a 83 y.o. male with a past medical history of hypertension, hyperlipidemia, CAD, bladder mass diagnosed on CT scan on 10/14/2018 who presents to ED for urinary retention and diarrhea for unknown amount of time.  Family member at bedside provides 47 of history.  She believes that symptoms have been going on since his visit to the ED on 10/14/2018.  He did not follow-up with urology as advised.  States that she believes anytime he tries to urinate, he has a bowel movement instead.  She is unsure if the blood in his stool is from external irritation.  She did note blood in his urine several weeks ago.  He is complaining of abdominal pain and distention.  No history of similar symptoms in the past.  She is concerned that he may be dehydrated.     HPI  Past Medical History:  Diagnosis Date  . Cataract 2020   bilateral cataracts  . Coronary artery disease   . Epididymitis    Left  . GERD (gastroesophageal reflux disease)   . Hyperlipidemia   . Hypertension   . MI (myocardial infarction) ([place])    first at age 79  . Routine general medical examination at a health care facility   . Skin lesions, generalized     Patient Active Problem List   Diagnosis Date Noted  . AKI (acute kidney injury) ([place]) 10/26/2018  . Testicular swelling 09/17/2018  . Right inguinal hernia 09/17/2018  . Encounter for annual general medical examination with abnormal findings in adult 09/22/2017  . Recurrent falls 06/24/2016  . Right shoulder pain 10/18/2015  . Essential hypertension 10/17/2008  . History of myocardial infarction 10/17/2008  . GERD 10/17/2008  . HYPERLIPIDEMIA 03/05/2007  . Chronic ischemic heart disease 03/05/2007  . SKIN LESIONS,  MULTIPLE 03/05/2007    Past Surgical History:  Procedure Laterality Date  . APPENDECTOMY    . CARDIAC CATHETERIZATION     MI x4 with stents   . CHOLECYSTECTOMY    . HIATAL HERNIA REPAIR    . INGUINAL HERNIA REPAIR    . TONSILLECTOMY          Home Medications    Prior to Admission medications   Medication Sig Start Date End Date Taking? Authorizing Provider  aspirin EC 81 MG tablet Take 81 mg by mouth daily.   Yes [provider]  metoprolol tartrate (LOPRESSOR) 50 MG tablet TAKE 1 TABLET BY MOUTH TWICE DAILY. PLEASE KEEP UPCOMING APPT IN SEPTEMBER BEFORE ANYMORE REFILLS. Patient taking differently: Take 50 mg by mouth 2 (two) times daily.  03/08/18  Yes Evans Lance, MD  ramipril (ALTACE) 2.5 MG capsule Take 1 capsule (2.5 mg total) by mouth daily. 03/15/18  Yes Evans Lance, MD  simvastatin (ZOCOR) 10 MG tablet TAKE 1 TABLET BY MOUTH AT BEDTIME. PLEASE KEEP UPCOMING APPT IN SEPTEMBER BEFORE ANYMORE REFILLS. Patient taking differently: Take 10 mg by mouth at bedtime.  04/12/18  Yes Evans Lance, MD  sulfamethoxazole-trimethoprim (BACTRIM DS) 800-160 MG tablet Take 1 tablet by mouth 2 (two) times daily. Patient taking differently: Take 0.5-1 tablets by mouth 2 (two) times daily.  09/17/18  Yes Ria Bush, MD    Family History Family History  Problem Relation Age of Onset  . Asthma Mother     Social History Social History   Tobacco Use  . Smoking status: Former Smoker    Packs/day: 1.00    Years: 30.00    Pack years: 30.00    Quit date: 04/14/1986    Years since quitting: 32.5  . Smokeless tobacco: Never Used  Substance Use Topics  . Alcohol use: No  . Drug use: No     Allergies   Patient has no known allergies.   Review of Systems Review of Systems  Constitutional: Negative for appetite change, chills and fever.  HENT: Negative for ear pain, rhinorrhea, sneezing and sore throat.   Eyes: Negative for photophobia and visual disturbance.   Respiratory: Negative for cough, chest tightness, shortness of breath and wheezing.   Cardiovascular: Negative for chest pain and palpitations.  Gastrointestinal: Positive for abdominal distention, abdominal pain and diarrhea. Negative for blood in stool, constipation, nausea and vomiting.  Genitourinary: Positive for decreased urine volume and difficulty urinating. Negative for dysuria, hematuria and urgency.  Musculoskeletal: Negative for myalgias.  Skin: Negative for rash.  Neurological: Negative for dizziness, weakness and light-headedness.     Physical Exam Updated Vital Signs BP 133/69   Pulse (!) 101   Temp (!) 97.3 F (36.3 C) (Oral)   Resp 18   SpO2 98%   Physical Exam Vitals signs and nursing note reviewed.  Constitutional:      General: He is not in acute distress.    Appearance: He is well-developed.  HENT:     Head: Normocephalic and atraumatic.     Nose: Nose normal.  Eyes:     General: No scleral icterus.       Right eye: No discharge.        Left eye: No discharge.     Conjunctiva/sclera: Conjunctivae normal.  Neck:     Musculoskeletal: Normal range of motion and neck supple.  Cardiovascular:     Rate and Rhythm: Normal rate and regular rhythm.     Heart sounds: Normal heart sounds. No murmur. No friction rub. No gallop.   Pulmonary:     Effort: Pulmonary effort is normal. No respiratory distress.     Breath sounds: Normal breath sounds.  Abdominal:     General: Bowel sounds are normal. There is distension.     Palpations: Abdomen is soft.     Tenderness: There is abdominal tenderness. There is no guarding.  Musculoskeletal: Normal range of motion.  Skin:    General: Skin is warm and dry.     Findings: No rash.  Neurological:     Mental Status: He is alert.     Motor: No abnormal muscle tone.     Coordination: Coordination normal.      ED Treatments / Results  Labs (all labs ordered are listed, but only abnormal results are displayed) Labs  Reviewed  LIPASE, BLOOD - Abnormal; Notable for the following components:      Result Value   Lipase 78 (*)    All other components within normal limits  COMPREHENSIVE METABOLIC PANEL - Abnormal; Notable for the following components:   Potassium 7.0 (*)    CO2 9 (*)    Glucose, Bld 126 (*)    BUN 243 (*)    Creatinine, Ser 21.49 (*)    Albumin 2.9 (*)    GFR calc non Af Amer 2 (*)    GFR calc Af Amer 2 (*)    Anion  gap 30 (*)    All other components within normal limits  URINALYSIS, ROUTINE W REFLEX MICROSCOPIC - Abnormal; Notable for the following components:   Color, Urine AMBER (*)    APPearance TURBID (*)    Glucose, UA 50 (*)    Hgb urine dipstick LARGE (*)    Protein, ur 100 (*)    Leukocytes,Ua SMALL (*)    RBC / HPF >50 (*)    Bacteria, UA FEW (*)    All other components within normal limits  CBC WITH DIFFERENTIAL/PLATELET - Abnormal; Notable for the following components:   WBC 16.4 (*)    Neutro Abs 14.9 (*)    Lymphs Abs 0.5 (*)    Abs Immature Granulocytes 0.18 (*)    All other components within normal limits  CBG MONITORING, ED - Abnormal; Notable for the following components:   Glucose-Capillary 137 (*)    All other components within normal limits  CBG MONITORING, ED - Abnormal; Notable for the following components:   Glucose-Capillary 156 (*)    All other components within normal limits  URINE CULTURE  SARS CORONAVIRUS 2 (HOSPITAL ORDER, [facility])  COMPREHENSIVE METABOLIC PANEL  BASIC METABOLIC PANEL  RENAL FUNCTION PANEL  CBC    EKG EKG Interpretation  Date/Time:  Tuesday October 26 2018 14:12:43 EDT Ventricular Rate:  92 PR Interval:    QRS Duration: 157 QT Interval:  434 QTC Calculation: 537 R Axis:   65 Text Interpretation:  Age not entered, assumed to be  83 years old for purpose of ECG interpretation Sinus rhythm Right bundle branch block Inferior infarct, age indeterminate Artifact in lead(s) I II aVR aVL aVF V2 V3  Confirmed by Veryl Speak [phone]) on 10/26/2018 2:38:59 PM   Radiology No results found.  Procedures .Critical Care Performed by: Delia Heady, PA-C Authorized by: Delia Heady, PA-C   Critical care provider statement:    Critical care time (minutes):  35   Critical care was necessary to treat or prevent imminent or life-threatening deterioration of the following conditions:  Renal failure and metabolic crisis   Critical care was time spent personally by me on the following activities:  Development of treatment plan with patient or surrogate, discussions with consultants, evaluation of patient's response to treatment, examination of patient, obtaining history from patient or surrogate, ordering and review of laboratory studies, ordering and review of radiographic studies, ordering and performing treatments and interventions and re-evaluation of patient's condition   (including critical care time)  Medications Ordered in ED Medications  sodium bicarbonate 150 mEq in dextrose 5 % 1,000 mL infusion (has no administration in time range)  sodium zirconium cyclosilicate (LOKELMA) packet 10 g (has no administration in time range)  sodium chloride flush (NS) 0.9 % injection 3 mL (3 mLs Intravenous Given 10/26/18 1418)  calcium gluconate inj 10% (1 g) URGENT USE ONLY! (1 g Intravenous Given 10/26/18 1454)  insulin aspart (novoLOG) injection 5 Units (5 Units Intravenous Given 10/26/18 1459)    And  dextrose 50 % solution 50 mL (50 mLs Intravenous Given 10/26/18 1500)  sodium chloride 0.9 % bolus 1,000 mL (1,000 mLs Intravenous New Bag/Given 10/26/18 1530)  sodium zirconium cyclosilicate (LOKELMA) packet 10 g (10 g Oral Given 10/26/18 1532)     Initial Impression / Assessment and Plan / ED Course  I have reviewed the triage vital signs and the nursing notes.  Pertinent labs & imaging results that were available during my care of the patient  were reviewed by me and considered in my medical decision  making (see chart for details).  Clinical Course as of Oct 26 1710  Tue Oct 26, 2018  1240 Up from Cr 1 twelve days ago  Creatinine(!): 21.49 [HK]  1240 BUN(!): 243 [HK]  1240 Ordered calcium gluconate, insulin  Potassium(!!): 7.0 [HK]  1245 No EKG changes 2/2 hyperkalemia   [HK]  1503 Spoke to nephrologist.  She recommends giving patient Lokelma, 1 L bolus of fluids in order to avoid dialysis.  She also recommends repeating lab work after medications have been given.   [HK]    Clinical Course User Index [HK] Delia Heady, PA-C       83 year old male with a known bladder mass presents to ED for urinary retention and diarrhea.  He did not follow-up with urology after finding out that he had a bladder mass about 12 days ago.  He has had urinary retention for unknown amount of time, anywhere from 7 to 10 days.  Lab work here shows potassium of 7, creatinine of 21.49, BUN of 243.  Placed orders according to nephrologist, including calcium, insulin, Lokelma and fluids.  She asked that we obtain repeat metabolic panel and will try to avoid dialysis as much as possible if he can be improved with these measures.  Nephrology to consult.  Will admit to hospitalist for further management.  Of note, Foley was placed and urine output close to 800 cc.  Final Clinical Impressions(s) / ED Diagnoses   Final diagnoses:  Urinary retention  Acute renal failure, unspecified acute renal failure type [facility])  Hyperkalemia    ED Discharge Orders    None       Delia Heady, PA-C 10/26/18 1714    Veryl Speak, MD 10/26/18 [phone]

## 2018-10-26 NOTE — ED Notes (Signed)
Bladder scanned patient  Reading > 250 ml ; PA at bedside

## 2018-10-27 DIAGNOSIS — E870 Hyperosmolality and hypernatremia: Secondary | ICD-10-CM

## 2018-10-27 DIAGNOSIS — R339 Retention of urine, unspecified: Secondary | ICD-10-CM

## 2018-10-27 LAB — URINE CULTURE: Culture: NO GROWTH

## 2018-10-27 LAB — CBC
HCT: 36.8 % — ABNORMAL LOW (ref 39.0–52.0)
Hemoglobin: 12.4 g/dL — ABNORMAL LOW (ref 13.0–17.0)
MCH: 28.8 pg (ref 26.0–34.0)
MCHC: 33.7 g/dL (ref 30.0–36.0)
MCV: 85.4 fL (ref 80.0–100.0)
Platelets: 222 10*3/uL (ref 150–400)
RBC: 4.31 MIL/uL (ref 4.22–5.81)
RDW: 12.8 % (ref 11.5–15.5)
WBC: 12.1 10*3/uL — ABNORMAL HIGH (ref 4.0–10.5)
nRBC: 0 % (ref 0.0–0.2)

## 2018-10-27 LAB — RENAL FUNCTION PANEL
Albumin: 2.7 g/dL — ABNORMAL LOW (ref 3.5–5.0)
Anion gap: 11 (ref 5–15)
BUN: 61 mg/dL — ABNORMAL HIGH (ref 8–23)
CO2: 27 mmol/L (ref 22–32)
Calcium: 8.7 mg/dL — ABNORMAL LOW (ref 8.9–10.3)
Chloride: 117 mmol/L — ABNORMAL HIGH (ref 98–111)
Creatinine, Ser: 2.72 mg/dL — ABNORMAL HIGH (ref 0.61–1.24)
GFR calc Af Amer: 24 mL/min — ABNORMAL LOW (ref >60)
GFR calc non Af Amer: 21 mL/min — ABNORMAL LOW (ref >60)
Glucose, Bld: 196 mg/dL — ABNORMAL HIGH (ref 70–99)
Phosphorus: 2.7 mg/dL (ref 2.5–4.6)
Potassium: 3.9 mmol/L (ref 3.5–5.1)
Sodium: 155 mmol/L — ABNORMAL HIGH (ref 135–145)

## 2018-10-27 LAB — HEMOGLOBIN A1C
Hgb A1c MFr Bld: 6.4 % — ABNORMAL HIGH (ref 4.8–5.6)
Mean Plasma Glucose: 136.98 mg/dL

## 2018-10-27 LAB — MAGNESIUM: Magnesium: 2.2 mg/dL (ref 1.7–2.4)

## 2018-10-27 MED ORDER — DEXTROSE-NACL 5-0.45 % IV SOLN
INTRAVENOUS | Status: AC
  Administered 2018-10-27: 12:00:00 via INTRAVENOUS

## 2018-10-27 MED ORDER — AMLODIPINE BESYLATE 5 MG PO TABS
2.5000 mg | ORAL_TABLET | Freq: Every day | ORAL | Status: DC
  Administered 2018-10-28 – 2018-10-29 (×2): 2.5 mg via ORAL
  Filled 2018-10-27 (×3): qty 1

## 2018-10-27 NOTE — Progress Notes (Addendum)
PROGRESS NOTE   [name]  ONG:[id number]    DOB: 10-24-1935    DOA: 10/26/2018  PCP: Pleas Koch, NP   I have briefly reviewed patients previous medical records in [facility].  Chief Complaint  Patient presents with  . Diarrhea  . Urinary Retention    Brief Narrative:  83 year old male, lives with spouse and family, independent of activities, PMH of CAD, GERD, HLD, HTN, seen in ED 7/2 for dysuria and hematuria at which time CT showed a possible bladder versus prostate mass, creatinine 1.1, was discharged from ED with plans for urology to contact him regarding follow-up/cystoscopy, presented to the ED 7/14 with 2-week history of difficulty urinating, poor fluid intake, some hematuria, noted to have creatinine of 21.5, potassium 7, bicarbonate 9 and an acute urinary retention.  He was admitted for severe acute kidney injury, suspected due to acute urinary retention from bladder/prostate mass, hyperkalemia, anion gap metabolic acidosis.  Foley catheter placed, nephrology consulted.  Improving.   Assessment & Plan:   Principal Problem:   AKI (acute kidney injury) ([place]) Active Problems:   Hyperkalemia   High anion gap metabolic acidosis   Bladder mass   Severe acute kidney injury, obstructive  10/14/2018: Creatinine 1.1.  On admission 7/14: Creatinine 21.49, potassium 7, bicarbonate 9, anion gap 30.  Acute urinary retention in ED, Foley catheter inserted and immediately drained approximately 800 mL of bloody urine.  Acute kidney injury likely related to obstructive etiology related to recent CT renal study 7/2 which suggested bladder mass versus blood clot due to hematuria versus prostate mass invading bladder.  Most likely BPH.  Nephrology consultation appreciated.  Managed medically with IV fluids with quick improvement.  Acute urinary retention to be managed as below.  I discussed with Dr. Johnney Ou, Nephrology, due to hypernatremia, changed IV fluids to D5  half NS at 75 mL/h x 12 hours, place jug of water at bedside for liberal oral fluid intake, continue strict intake output charting, monitor BMP closely including this evening for hypokalemia due to postobstructive diuresis.  Last 24-hour urine output: 5.6 L.  Hyperkalemia  Due to acute kidney injury.  Potassium 7 on admission.  Treated in ED with IV calcium, insulin, dextrose, placed on bicarbonate drip and Lokelma.  Resolved.  Discontinue Lokelma.  Monitor closely for post obstructive diuresis and hypokalemia.  Anion gap metabolic acidosis  Due to acute kidney injury.  Resolved.  Hypernatremia  Sodium has steadily increased from 139 > 155 along with hyperchloremia  Possibly due to bicarbonate drip, changed to D5 half NS.  Acute urinary retention/BPH/??  Bladder versus prostate mass.  Imaging details as noted above.  Foley catheter placed this admission.  I discussed in detail with Dr. Alinda Money, Urologist on call who reviewed patient's recent CT and indicates that patient has a markedly enlarged prostate along with median lobe enlargement and suspects this is most likely BPH but malignancy needs to be ruled out.  He recommends continued Foley catheter at discharge, trial of Flomax, outpatient follow-up with urology.  Patient has an appointment with Dr. Phebe Colla on 8/14 at 1:45 PM.  Patient is to call for an earlier appointment after discharge.  Essential hypertension  Home ACEI on hold.  Mildly uncontrolled.  Continue metoprolol 50 mg twice daily.  Hyperlipidemia  Continue statins.  CAD  Asymptomatic of chest pain.  Continue metoprolol.  Leukocytosis  Likely stress response.  Improved.  No clinical focus of infection  Hyperglycemia  Likely related to dextrose  he received for hyperkalemia.  However rule out DM, check A1c.   DVT prophylaxis: SCDs Code Status: Partial.  CPR okay.  DNI. Family Communication: I discussed in detail with patient spouse via phone,  updated care and answered questions.  Advised her regarding the importance of keeping up with outpatient Urology consultation appointment. Disposition: Home pending clinical improvement, possibly 7/16   Consultants:  Nephrology  Procedures:  Foley catheter 7/14  Antimicrobials:  None   Subjective: Poor historian.  Denies complaints.  Denies dysuria.  Thirsty and asking for water to drink.  No dyspnea or chest pain.  Not aware of urology consultation or follow-up.  As per RN, no acute issues noted.    Objective:  Vitals:   10/27/18 0100 10/27/18 0605 10/27/18 0852 10/27/18 1011  BP:  (!) 153/71 (!) 166/70 (!) 166/70  Pulse:  99 96 96  Resp:  16 18 18   Temp:  97.7 F (36.5 C) 97.7 F (36.5 C) 97.7 F (36.5 C)  TempSrc:  Oral Oral Oral  SpO2:  96% 95%   Weight: 45.2 kg   45.2 kg  Height:    5\' 4"  (1.626 m)    Examination:  General exam: Pleasant elderly male, small built and thinly nourished, lying comfortably propped up in bed.  Oral mucosa with borderline hydration. Respiratory system: Clear to auscultation. Respiratory effort normal. Cardiovascular system: S1 & S2 heard, RRR. No JVD, murmurs, rubs, gallops or clicks. No pedal edema.  Telemetry personally reviewed: Sinus rhythm. Gastrointestinal system: Abdomen is nondistended, soft and nontender. No organomegaly or masses felt. Normal bowel sounds heard. Central nervous system: Alert and oriented. No focal neurological deficits. Extremities: Symmetric 5 x 5 power. Skin: No rashes, lesions or ulcers Psychiatry: Judgement and insight appear impaired. Mood & affect appropriate.  GU: Indwelling Foley catheter with straw-colored urine and no gross hematuria.    Data Reviewed: I have personally reviewed following labs and imaging studies  CBC: Recent Labs  Lab 10/26/18 1240 10/27/18 0812  WBC 16.4* 12.1*  NEUTROABS 14.9*  --   HGB 13.0 12.4*  HCT 39.3 36.8*  MCV 88.5 85.4  PLT 283 341   Basic Metabolic Panel:  Recent Labs  Lab 10/26/18 1240 10/26/18 1629 10/26/18 2024 10/27/18 0812  NA 139 140 147* 155*  K 7.0* 5.9* 4.7 3.9  CL 100 106 115* 117*  CO2 9* 15* 14* 27  GLUCOSE 126* 174* 158* 196*  BUN 243* 212* 153* 61*  CREATININE 21.49* 17.29* 10.99* 2.72*  CALCIUM 8.9 9.2 9.0 8.7*  MG  --   --   --  2.2  PHOS  --   --   --  2.7   Liver Function Tests: Recent Labs  Lab 10/26/18 1240 10/26/18 1629 10/27/18 0812  AST 24 25  --   ALT 34 35  --   ALKPHOS 69 68  --   BILITOT 0.5 0.6  --   PROT 6.5 6.5  --   ALBUMIN 2.9* 2.7* 2.7*   Coagulation Profile: No results for input(s): INR, PROTIME in the last 168 hours. Cardiac Enzymes: No results for input(s): CKTOTAL, CKMB, CKMBINDEX, TROPONINI in the last 168 hours. HbA1C: No results for input(s): HGBA1C in the last 72 hours. CBG: Recent Labs  Lab 10/26/18 1452 10/26/18 1633  GLUCAP 137* 156*    Recent Results (from the past 240 hour(s))  SARS Coronavirus 2 (CEPHEID - Performed in [place] hospital lab), Hosp Order     Status: None   Collection  Time: 10/26/18  5:44 PM   Specimen: Nasopharyngeal Swab  Result Value Ref Range Status   SARS Coronavirus 2 NEGATIVE NEGATIVE Final    Comment: (NOTE) If result is NEGATIVE SARS-CoV-2 target nucleic acids are NOT DETECTED. The SARS-CoV-2 RNA is generally detectable in upper and lower  respiratory specimens during the acute phase of infection. The lowest  concentration of SARS-CoV-2 viral copies this assay can detect is 250  copies / mL. A negative result does not preclude SARS-CoV-2 infection  and should not be used as the sole basis for treatment or other  patient management decisions.  A negative result may occur with  improper specimen collection / handling, submission of specimen other  than nasopharyngeal swab, presence of viral mutation(s) within the  areas targeted by this assay, and inadequate number of viral copies  (<250 copies / mL). A negative result must be combined  with clinical  observations, patient history, and epidemiological information. If result is POSITIVE SARS-CoV-2 target nucleic acids are DETECTED. The SARS-CoV-2 RNA is generally detectable in upper and lower  respiratory specimens dur ing the acute phase of infection.  Positive  results are indicative of active infection with SARS-CoV-2.  Clinical  correlation with patient history and other diagnostic information is  necessary to determine patient infection status.  Positive results do  not rule out bacterial infection or co-infection with other viruses. If result is PRESUMPTIVE POSTIVE SARS-CoV-2 nucleic acids MAY BE PRESENT.   A presumptive positive result was obtained on the submitted specimen  and confirmed on repeat testing.  While 2019 novel coronavirus  (SARS-CoV-2) nucleic acids may be present in the submitted sample  additional confirmatory testing may be necessary for epidemiological  and / or clinical management purposes  to differentiate between  SARS-CoV-2 and other Sarbecovirus currently known to infect humans.  If clinically indicated additional testing with an alternate test  methodology [id number]) is advised. The SARS-CoV-2 RNA is generally  detectable in upper and lower respiratory sp ecimens during the acute  phase of infection. The expected result is Negative. Fact Sheet for Patients:  StrictlyIdeas.no Fact Sheet for Healthcare Providers: BankingDealers.co.za This test is not yet approved or cleared by the [place] FDA and has been authorized for detection and/or diagnosis of SARS-CoV-2 by FDA under an Emergency Use Authorization (EUA).  This EUA will remain in effect (meaning this test can be used) for the duration of the COVID-19 declaration under Section 564(b)(1) of the Act, 21 U.S.C. section 360bbb-3(b)(1), unless the authorization is terminated or revoked sooner. Performed at [facility], [place] [address] [place], [place] [address]          Radiology Studies: No results found.      Scheduled Meds: . metoprolol tartrate  50 mg Oral BID  . simvastatin  10 mg Oral QHS  . sodium chloride flush  3 mL Intravenous Q12H  . sodium zirconium cyclosilicate  10 g Oral TID   Continuous Infusions: .  sodium bicarbonate  infusion 1000 mL 125 mL/hr at 10/27/18 0254     LOS: 1 day     Vernell Leep, MD, FACP, [facility]. [facility]  To contact the attending provider between 7A-7P or the covering provider during after hours 7P-7A, please log into the web site [email or web address] and access using universal [place] password for that web site. If you do not have the password, please call the hospital operator.  10/27/2018, 10:23 AM

## 2018-10-27 NOTE — Progress Notes (Signed)
[facility] Progress Note   Subjective:   Feeling improved today.  Says very thirsty.   I/Os yesterday  2.1 / 5650, today 546mL doc so far.   Objective Vitals:   10/27/18 0100 10/27/18 0605 10/27/18 0852 10/27/18 1011  BP:  (!) 153/71 (!) 166/70 (!) 166/70  Pulse:  99 96 96  Resp:  16 18 18   Temp:  97.7 F (36.5 C) 97.7 F (36.5 C) 97.7 F (36.5 C)  TempSrc:  Oral Oral Oral  SpO2:  96% 95%   Weight: 45.2 kg   45.2 kg  Height:    5\' 4"  (1.626 m)   Physical Exam General: elderly man lying flat in bed, more alert than yesterday Heart: RRR, no rub Lungs: clear Abdomen: soft Extremities: no edema GU:  Foley with clear yellow urine in bag now   Additional Objective Labs: Basic Metabolic Panel: Recent Labs  Lab 10/26/18 1629 10/26/18 2024 10/27/18 0812  NA 140 147* 155*  K 5.9* 4.7 3.9  CL 106 115* 117*  CO2 15* 14* 27  GLUCOSE 174* 158* 196*  BUN 212* 153* 61*  CREATININE 17.29* 10.99* 2.72*  CALCIUM 9.2 9.0 8.7*  PHOS  --   --  2.7   Liver Function Tests: Recent Labs  Lab 10/26/18 1240 10/26/18 1629 10/27/18 0812  AST 24 25  --   ALT 34 35  --   ALKPHOS 69 68  --   BILITOT 0.5 0.6  --   PROT 6.5 6.5  --   ALBUMIN 2.9* 2.7* 2.7*   Recent Labs  Lab 10/26/18 1240  LIPASE 78*   CBC: Recent Labs  Lab 10/26/18 1240 10/27/18 0812  WBC 16.4* 12.1*  NEUTROABS 14.9*  --   HGB 13.0 12.4*  HCT 39.3 36.8*  MCV 88.5 85.4  PLT 283 222   Blood Culture    Component Value Date/Time   SDES URINE, RANDOM 10/14/2018 1319   SPECREQUEST  10/14/2018 1319    NONE Performed at [facility], [address], [place], [place] [address]    CULT 10,000 COLONIES/mL STAPHYLOCOCCUS EPIDERMIDIS (A) 10/14/2018 1319   REPTSTATUS 10/16/2018 FINAL 10/14/2018 1319    Cardiac Enzymes: No results for input(s): CKTOTAL, CKMB, CKMBINDEX, TROPONINI in the last 168 hours. CBG: Recent Labs  Lab 10/26/18 1452 10/26/18 1633  GLUCAP 137* 156*   Iron  Studies: No results for input(s): IRON, TIBC, TRANSFERRIN, FERRITIN in the last 72 hours. @lablastinr3 @ Studies/Results: No results found. Medications: . dextrose 5 % and 0.45% NaCl 75 mL/hr at 10/27/18 1200   . metoprolol tartrate  50 mg Oral BID  . simvastatin  10 mg Oral QHS  . sodium chloride flush  3 mL Intravenous Q12H     Assessment/Plan: **AKI:  Severe.  Cr 1.11 just 12 days ago and on presentation 7/14 BUN 243/Cr 21. Imaging with obstruction and foley placed.  Dramatic improvement in < 24h with relief of obstruction and fluids.    **Hyperkalemia: resolved, check this afternoon to make sure not developing hypokalemia now after aggressive management of hyperK.   **Hypernatremia:  Etiology combo bicarb gtt + osmotic diuresis.  Give 1/2NS x 12h, access to po water.    **Metabolic acidosis:  Secondary to AKI > resolved, bicarb gtt stopped.   **Bladder mass, obstruction: Dr. Algis Liming d/w urology - suspect BPH but will have close f/u with urology for cysto arrangement to fully eval.  Will be discharged with foley cathter.    **Leukocytosis:  Afebrile,  no localizing s/s infection.  May be stress response and has improved dramatically.  Per admitting team.   **HTN: hold home ACEi for AKI, add amlodipine 2.5mg  daily for now. On home dose metoprolol   **Dispo: likely will be able to discharge tomorrow if electrolytes and volume status ok on oral intake.   Jannifer Hick MD 10/27/2018, 2:05 PM  [facility] Pager: [phone]

## 2018-10-27 NOTE — Plan of Care (Signed)
  Problem: Pain Managment: Goal: General experience of comfort will improve Outcome: Progressing   

## 2018-10-27 NOTE — Progress Notes (Signed)
Spoke with patients daughter [name] and gave her an update on patient.

## 2018-10-27 NOTE — Progress Notes (Signed)
Patient refused his 1700 CBG check. Education provided on importance of checking his labs. Patient continued to refuse. Will notify MD.

## 2018-10-27 NOTE — Progress Notes (Signed)
PT Cancellation Note  Patient Details Name: [name] MRN: [id number] DOB: 17-Oct-1935   Cancelled Treatment:    Reason Eval/Treat Not Completed: Fatigue/lethargy limiting ability to participate.  Attempted to talk with and move pt who shook his head No, and would not verbally interact with PT.  Pt had his eyes closed the entire attempt.  Try again at another time.   Ramond Dial 10/27/2018, 4:15 PM   Mee Hives, PT MS Acute Rehab Dept. Number: [place] and [place]

## 2018-10-27 NOTE — Progress Notes (Signed)
Patient is refusing to take amlodipine and drink fluids at this time. Will try at a later time when patients is more alert and oriented.

## 2018-10-28 LAB — BASIC METABOLIC PANEL
Anion gap: 7 (ref 5–15)
BUN: 17 mg/dL (ref 8–23)
CO2: 26 mmol/L (ref 22–32)
Calcium: 8.8 mg/dL — ABNORMAL LOW (ref 8.9–10.3)
Chloride: 118 mmol/L — ABNORMAL HIGH (ref 98–111)
Creatinine, Ser: 0.91 mg/dL (ref 0.61–1.24)
GFR calc Af Amer: >60 mL/min (ref >60)
GFR calc non Af Amer: >60 mL/min (ref >60)
Glucose, Bld: 110 mg/dL — ABNORMAL HIGH (ref 70–99)
Potassium: 3.8 mmol/L (ref 3.5–5.1)
Sodium: 151 mmol/L — ABNORMAL HIGH (ref 135–145)

## 2018-10-28 LAB — GLUCOSE, CAPILLARY
Glucose-Capillary: 109 mg/dL — ABNORMAL HIGH (ref 70–99)
Glucose-Capillary: 150 mg/dL — ABNORMAL HIGH (ref 70–99)
Glucose-Capillary: 149 mg/dL — ABNORMAL HIGH (ref 70–99)

## 2018-10-28 MED ORDER — DEXTROSE 5 % IV SOLN
INTRAVENOUS | Status: AC
  Administered 2018-10-28: 11:00:00 via INTRAVENOUS

## 2018-10-28 MED ORDER — TAMSULOSIN HCL 0.4 MG PO CAPS
0.4000 mg | ORAL_CAPSULE | Freq: Every day | ORAL | Status: DC
  Administered 2018-10-28 – 2018-10-29 (×2): 0.4 mg via ORAL
  Filled 2018-10-28 (×2): qty 1

## 2018-10-28 MED ORDER — SODIUM CHLORIDE 0.45 % IV SOLN
INTRAVENOUS | Status: DC
  Administered 2018-10-28: 08:00:00 via INTRAVENOUS

## 2018-10-28 NOTE — Progress Notes (Signed)
Patient refused his 7:00 am CBG check. MD notify.

## 2018-10-28 NOTE — Progress Notes (Signed)
[facility] Progress Note   Subjective:   Feeling improved today.  Says thirsty.  I gave him a cup of water when I was in the room and he drank most of it without prompting. I/Os yesterday  2.3 / 2.2.   Objective Vitals:   10/27/18 1646 10/27/18 2121 10/28/18 0516 10/28/18 0909  BP: (!) 147/67 (!) 160/75 (!) 174/87 (!) 168/91  Pulse: 84 94 99 100  Resp: 18 19 18 16   Temp: 98.6 F (37 C)  97.6 F (36.4 C) 97.9 F (36.6 C)  TempSrc: Oral  Axillary Oral  SpO2: 96%  96% 97%  Weight:  45.5 kg    Height:       Physical Exam General: elderly man lying flat in bed, comfortable Heart: RRR, no rub Lungs: clear Abdomen: soft Extremities: no edema GU:  Foley with clear yellow urine in bag now   Additional Objective Labs: Basic Metabolic Panel: Recent Labs  Lab 10/26/18 2024 10/27/18 0812 10/28/18 0502  NA 147* 155* 151*  K 4.7 3.9 3.8  CL 115* 117* 118*  CO2 14* 27 26  GLUCOSE 158* 196* 110*  BUN 153* 61* 17  CREATININE 10.99* 2.72* 0.91  CALCIUM 9.0 8.7* 8.8*  PHOS  --  2.7  --    Liver Function Tests: Recent Labs  Lab 10/26/18 1240 10/26/18 1629 10/27/18 0812  AST 24 25  --   ALT 34 35  --   ALKPHOS 69 68  --   BILITOT 0.5 0.6  --   PROT 6.5 6.5  --   ALBUMIN 2.9* 2.7* 2.7*   Recent Labs  Lab 10/26/18 1240  LIPASE 78*   CBC: Recent Labs  Lab 10/26/18 1240 10/27/18 0812  WBC 16.4* 12.1*  NEUTROABS 14.9*  --   HGB 13.0 12.4*  HCT 39.3 36.8*  MCV 88.5 85.4  PLT 283 222   Blood Culture    Component Value Date/Time   SDES URINE, RANDOM 10/26/2018 1255   SPECREQUEST NONE 10/26/2018 1255   CULT  10/26/2018 1255    NO GROWTH Performed at [facility], [place] [address], [place], [place] [address]    REPTSTATUS 10/27/2018 FINAL 10/26/2018 1255    Cardiac Enzymes: No results for input(s): CKTOTAL, CKMB, CKMBINDEX, TROPONINI in the last 168 hours. CBG: Recent Labs  Lab 10/26/18 1452 10/26/18 1633  GLUCAP 137* 156*    Iron Studies: No results for input(s): IRON, TIBC, TRANSFERRIN, FERRITIN in the last 72 hours. @lablastinr3 @ Studies/Results: No results found. Medications:  . amLODipine  2.5 mg Oral Daily  . metoprolol tartrate  50 mg Oral BID  . simvastatin  10 mg Oral QHS  . sodium chloride flush  3 mL Intravenous Q12H  . tamsulosin  0.4 mg Oral Daily     Assessment/Plan: **AKI:  Severe.  Cr 1.11 just 12 days ago and on presentation 7/14 BUN 243/Cr 21. Imaging with obstruction and foley placed.  Dramatic improvement and nearly normal now.   **Hyperkalemia: resolved.  **Hypernatremia:  Etiology combo bicarb gtt + osmotic diuresis.  Improved to 151.  Pt drinking well when provided water.   I think it's reasonable to maintain him off IV fluids as long as he has access to water (though he's not really saavy at using callbell so may not be reliable).  I have D/Cd his 1/2NS this AM.  If IVF use thought needed would use D5W at low rate such as 50-75/hr just for 12-24h at a time with daily  BMP.   **Metabolic acidosis:  Secondary to AKI > resolved, bicarb gtt stopped.   **Bladder mass, obstruction: Dr. Algis Liming d/w urology - suspect BPH but will have close f/u with urology for cysto arrangement to fully eval.  Will be discharged with foley cathter.    **Leukocytosis:  Afebrile, no localizing s/s infection.  May be stress response and has improved dramatically.    **HTN: hold home ACEi for AKI, added amlodipine 2.5mg  daily yesterday. On home dose metoprolol.  I think it's ok to resume home ACEi on discharge.  **Dispo: I think discharge today or tomorrow is reasonable depending on his strength and family support.  Would recommend f/u with PCP in 1 week to check electrolytes and BP.  I don't think nephrology f/u is needed.  Will sign off.  Jannifer Hick MD 10/28/2018, 9:42 AM  [facility] Pager: [phone]

## 2018-10-28 NOTE — Progress Notes (Addendum)
PROGRESS NOTE   [name]  JHE:[id number]    DOB: 1935-06-28    DOA: 10/26/2018  PCP: Pleas Koch, NP   I have briefly reviewed patients previous medical records in [facility].  Chief Complaint  Patient presents with  . Diarrhea  . Urinary Retention    Brief Narrative:  83 year old male, lives with spouse and family, independent of activities, PMH of CAD, GERD, HLD, HTN, seen in ED 7/2 for dysuria and hematuria at which time CT showed a possible bladder versus prostate mass, creatinine 1.1, was discharged from ED with plans for urology to contact him regarding follow-up/cystoscopy, presented to the ED 7/14 with 2-week history of difficulty urinating, poor fluid intake, some hematuria, noted to have creatinine of 21.5, potassium 7, bicarbonate 9 and an acute urinary retention.  He was admitted for severe acute kidney injury, suspected due to acute urinary retention from bladder/prostate mass, hyperkalemia, anion gap metabolic acidosis.  Foley catheter placed, nephrology consulted.  Improving gradually.  Assessment & Plan:   Principal Problem:   AKI (acute kidney injury) ([place]) Active Problems:   Hyperkalemia   High anion gap metabolic acidosis   Bladder mass   Severe acute kidney injury, obstructive  10/14/2018: Creatinine 1.1.  On admission 7/14: Creatinine 21.49, potassium 7, bicarbonate 9, anion gap 30.  Acute urinary retention in ED, Foley catheter inserted and immediately drained approximately 800 mL of bloody urine.  Acute kidney injury likely related to obstructive etiology related to recent CT renal study 7/2 which suggested bladder mass versus blood clot due to hematuria versus prostate mass invading bladder.  Most likely BPH.  Nephrology consultation appreciated.  Managed medically with IV fluids with quick improvement.  Acute urinary retention to be managed as below.  Creatinine has normalized/0.91.  Nephrology follow-up appreciated, have  discontinued half NS, encouraging oral fluid intake and signed off.  Nonoliguric.  Follow BMP in a.m. and in a week's time as outpatient.  Hyperkalemia  Due to acute kidney injury.  Potassium 7 on admission.  Treated in ED with IV calcium, insulin, dextrose, placed on bicarbonate drip and Lokelma.  Resolved.    Anion gap metabolic acidosis  Due to acute kidney injury.  Resolved.  Hypernatremia  Sodium has steadily increased from 139 > 155 along with hyperchloremia  Possibly due to bicarbonate drip, changed to D5 half NS.  Sodium has improved to 151.  Yesterday patient had inconsistent oral fluid intake due to confusion/AMS and also refused CBG and labs last evening.  Mental status better today and drinking more.  However given unreliability of oral intake, in addition to encouraging orally, will continue gentle IV D5 infusion at 50 mL/h and follow BMP in a.m.  Acute urinary retention/BPH/??  Bladder versus prostate mass.  Imaging details as noted above.  Foley catheter placed this admission.  I discussed in detail with Dr. Alinda Money, Urologist on call on 7/15 who reviewed patient's recent CT and indicates that patient has a markedly enlarged prostate along with median lobe enlargement and suspects this is most likely BPH but malignancy needs to be ruled out.  He recommends continued Foley catheter at discharge, trial of Flomax, outpatient follow-up with urology.  Patient has an appointment with Dr. Phebe Colla on 8/14 at 1:45 PM.  Patient is to call for an earlier appointment after discharge.  Essential hypertension  Home ACEI on hold.  Continue metoprolol 50 mg twice daily.  Amlodipine 2.5 mg daily started yesterday.  As per nephrology, okay to  resume ACEI at discharge.  Hyperlipidemia  Continue statins.  CAD  Asymptomatic of chest pain.  Continue metoprolol.  Leukocytosis  Likely stress response.  Improved.  No clinical focus of infection.  Urine culture negative.   Hyperglycemia/prediabetes  A1c 6.4.  Patient has been refusing CBG checks.  Mildly controlled at times.  Discontinue CBGs and outpatient follow-up with PCP.  Acute metabolic encephalopathy  Due to acute kidney injury, mild hypernatremia complicating likely underlying dementia.  Continue to treat treatable and monitor.    DVT prophylaxis: SCDs Code Status: Partial.  CPR okay.  DNI. Family Communication: At spouses request, discussed with patient's 2 daughters in detail, updated care and answered questions. Disposition: Home pending clinical improvement, possibly 7/16   Consultants:  Nephrology  Procedures:  Foley catheter 7/14  Antimicrobials:  None   Subjective: Events of last 24 hours noted.  As per RN charting, patient refused CBG checks and labs last evening.  As per RN, refused CBG this morning as well.  Alert and oriented only to self and partly to place but not to time.  Follows simple instructions.  Not agitated.  Thirsty and willing to drink water today. As per discussion with daughter, patient does have short-term memory deficits, forgets things and his wife has to follow-up behind him.  Does remember remote things.   Objective:  Vitals:   10/27/18 1646 10/27/18 2121 10/28/18 0516 10/28/18 0909  BP: (!) 147/67 (!) 160/75 (!) 174/87 (!) 168/91  Pulse: 84 94 99 100  Resp: 18 19 18 16   Temp: 98.6 F (37 C)  97.6 F (36.4 C) 97.9 F (36.6 C)  TempSrc: Oral  Axillary Oral  SpO2: 96%  96% 97%  Weight:  45.5 kg    Height:        Examination:  General exam: Pleasant elderly male, small built and thinly nourished, lying comfortably propped up in bed.  Oral mucosa with borderline hydration. Respiratory system: Clear to auscultation.  No wheezing, rhonchi or crackles.  No increased work of breathing. Cardiovascular system: S1 and S2 heard, RRR.  No JVD, murmurs or pedal edema.  Telemetry personally reviewed: Sinus rhythm with PVCs. Gastrointestinal system:  Abdomen is nondistended, soft and nontender. No organomegaly or masses felt. Normal bowel sounds heard. Central nervous system: Alert and oriented to self, partly to place (hospital in [place]) but not to time.  Follows simple instructions. No focal neurological deficits. Extremities: Symmetric 5 x 5 power. Skin: No rashes, lesions or ulcers Psychiatry: Judgement and insight impaired. Mood & affect pleasant.  GU: Indwelling Foley catheter with straw-colored urine and no gross hematuria.    Data Reviewed: I have personally reviewed following labs and imaging studies  CBC: Recent Labs  Lab 10/26/18 1240 10/27/18 0812  WBC 16.4* 12.1*  NEUTROABS 14.9*  --   HGB 13.0 12.4*  HCT 39.3 36.8*  MCV 88.5 85.4  PLT 283 756   Basic Metabolic Panel: Recent Labs  Lab 10/26/18 1240 10/26/18 1629 10/26/18 2024 10/27/18 0812 10/28/18 0502  NA 139 140 147* 155* 151*  K 7.0* 5.9* 4.7 3.9 3.8  CL 100 106 115* 117* 118*  CO2 9* 15* 14* 27 26  GLUCOSE 126* 174* 158* 196* 110*  BUN 243* 212* 153* 61* 17  CREATININE 21.49* 17.29* 10.99* 2.72* 0.91  CALCIUM 8.9 9.2 9.0 8.7* 8.8*  MG  --   --   --  2.2  --   PHOS  --   --   --  2.7  --  Liver Function Tests: Recent Labs  Lab 10/26/18 1240 10/26/18 1629 10/27/18 0812  AST 24 25  --   ALT 34 35  --   ALKPHOS 69 68  --   BILITOT 0.5 0.6  --   PROT 6.5 6.5  --   ALBUMIN 2.9* 2.7* 2.7*   HbA1C: Recent Labs    10/27/18 1640  HGBA1C 6.4*   CBG: Recent Labs  Lab 10/26/18 1452 10/26/18 1633  GLUCAP 137* 156*    Recent Results (from the past 240 hour(s))  Urine culture     Status: None   Collection Time: 10/26/18 12:55 PM   Specimen: Urine, Random  Result Value Ref Range Status   Specimen Description URINE, RANDOM  Final   Special Requests NONE  Final   Culture   Final    NO GROWTH Performed at [facility], [place] [address], [place], [place] [address]    Report Status 10/27/2018 FINAL  Final  SARS Coronavirus 2  (CEPHEID - Performed in [facility] lab), Hosp Order     Status: None   Collection Time: 10/26/18  5:44 PM   Specimen: Nasopharyngeal Swab  Result Value Ref Range Status   SARS Coronavirus 2 NEGATIVE NEGATIVE Final    Comment: (NOTE) If result is NEGATIVE SARS-CoV-2 target nucleic acids are NOT DETECTED. The SARS-CoV-2 RNA is generally detectable in upper and lower  respiratory specimens during the acute phase of infection. The lowest  concentration of SARS-CoV-2 viral copies this assay can detect is 250  copies / mL. A negative result does not preclude SARS-CoV-2 infection  and should not be used as the sole basis for treatment or other  patient management decisions.  A negative result may occur with  improper specimen collection / handling, submission of specimen other  than nasopharyngeal swab, presence of viral mutation(s) within the  areas targeted by this assay, and inadequate number of viral copies  (<250 copies / mL). A negative result must be combined with clinical  observations, patient history, and epidemiological information. If result is POSITIVE SARS-CoV-2 target nucleic acids are DETECTED. The SARS-CoV-2 RNA is generally detectable in upper and lower  respiratory specimens dur ing the acute phase of infection.  Positive  results are indicative of active infection with SARS-CoV-2.  Clinical  correlation with patient history and other diagnostic information is  necessary to determine patient infection status.  Positive results do  not rule out bacterial infection or co-infection with other viruses. If result is PRESUMPTIVE POSTIVE SARS-CoV-2 nucleic acids MAY BE PRESENT.   A presumptive positive result was obtained on the submitted specimen  and confirmed on repeat testing.  While 2019 novel coronavirus  (SARS-CoV-2) nucleic acids may be present in the submitted sample  additional confirmatory testing may be necessary for epidemiological  and / or clinical  management purposes  to differentiate between  SARS-CoV-2 and other Sarbecovirus currently known to infect humans.  If clinically indicated additional testing with an alternate test  methodology [id number]) is advised. The SARS-CoV-2 RNA is generally  detectable in upper and lower respiratory sp ecimens during the acute  phase of infection. The expected result is Negative. Fact Sheet for Patients:  StrictlyIdeas.no Fact Sheet for Healthcare Providers: BankingDealers.co.za This test is not yet approved or cleared by the [place] FDA and has been authorized for detection and/or diagnosis of SARS-CoV-2 by FDA under an Emergency Use Authorization (EUA).  This EUA will remain in effect (meaning this test can be used)  for the duration of the COVID-19 declaration under Section 564(b)(1) of the Act, 21 U.S.C. section 360bbb-3(b)(1), unless the authorization is terminated or revoked sooner. Performed at [facility], [place] [address] [place], [place] [address]          Radiology Studies: No results found.      Scheduled Meds: . amLODipine  2.5 mg Oral Daily  . metoprolol tartrate  50 mg Oral BID  . simvastatin  10 mg Oral QHS  . sodium chloride flush  3 mL Intravenous Q12H  . tamsulosin  0.4 mg Oral Daily   Continuous Infusions:    LOS: 2 days     Vernell Leep, MD, FACP, [facility]. [facility]  To contact the attending provider between 7A-7P or the covering provider during after hours 7P-7A, please log into the web site [email or web address] and access using universal [place] password for that web site. If you do not have the password, please call the hospital operator.  10/28/2018, 10:27 AM

## 2018-10-28 NOTE — Evaluation (Signed)
Physical Therapy Evaluation Patient Details Name: [name] MRN: [id number] DOB: 1935/12/16 Today's Date: 10/28/2018   History of Present Illness  Pt is an 83 y/o male presenting with difficulty urinating and admitted secondary to severe AKI with hyperkalemia and metabolic acidosis complicated by ACE inhibitor use. Of note, pt seen in the ED pm 7/2 with dysuria and hematuria; CT at that time showed a possible bladder versus prostate mass. PMH including but not limited to CAD, MI, HTN and HLD.    Clinical Impression  Pt admitted with above diagnosis. Pt currently with functional limitations due to the deficits listed below (see PT Problem List). PTA reports livnig at home with wife, denies falls, independent without AD, patient AOx1 and not reliable historian today. Today, min A for stability to stand EOB, will need family assistance to prevent risk of falls if to go home. Limited OOB mobility due to IV leak, will continue to assess next visit and update recs if needed.  Pt will benefit from skilled PT to increase their independence and safety with mobility to allow discharge to the venue listed below.       Follow Up Recommendations Home health PT;Supervision/Assistance - 15 hour(with family assistance- if not avail, SNF)    Equipment Recommendations       Recommendations for Other Services       Precautions / Restrictions Restrictions Weight Bearing Restrictions: No      Mobility  Bed Mobility Overal bed mobility: Modified Independent                Transfers Overall transfer level: Needs assistance Equipment used: Rolling walker (2 wheeled) Transfers: Sit to/from Stand Sit to Stand: Min assist         General transfer comment: RW used, unsteady, min A to steady, patients IV leaking at time, session ended prematurely.   Ambulation/Gait                Stairs            Wheelchair Mobility    Modified Rankin (Stroke Patients Only)        Balance                                             Pertinent Vitals/Pain Pain Assessment: No/denies pain    Home Living Family/patient expects to be discharged to:: Private residence Living Arrangements: Spouse/significant other               Additional Comments: unreliable historian     Prior Function           Comments: reports he walks at home without AD, lives with his wife in 1 story home, denies falls.      Hand Dominance        Extremity/Trunk Assessment   Upper Extremity Assessment Upper Extremity Assessment: Overall WFL for tasks assessed    Lower Extremity Assessment Lower Extremity Assessment: Overall WFL for tasks assessed       Communication   Communication: No difficulties  Cognition Arousal/Alertness: Awake/alert Behavior During Therapy: WFL for tasks assessed/performed Overall Cognitive Status: Within Functional Limits for tasks assessed                                        General Comments  Exercises     Assessment/Plan    PT Assessment Patient needs continued PT services  PT Problem List Decreased strength       PT Treatment Interventions DME instruction;Gait training;Functional mobility training;Therapeutic activities;Therapeutic exercise    PT Goals (Current goals can be found in the Care Plan section)  Acute Rehab PT Goals Patient Stated Goal: non stated PT Goal Formulation: With patient Time For Goal Achievement: 11/11/18 Potential to Achieve Goals: Good    Frequency Min 3X/week   Barriers to discharge        Co-evaluation               AM-PAC PT "6 Clicks" Mobility  Outcome Measure Help needed turning from your back to your side while in a flat bed without using bedrails?: None Help needed moving from lying on your back to sitting on the side of a flat bed without using bedrails?: None Help needed moving to and from a bed to a chair (including a wheelchair)?:  A Little Help needed standing up from a chair using your arms (e.g., wheelchair or bedside chair)?: A Little Help needed to walk in hospital room?: A Little Help needed climbing 3-5 steps with a railing? : A Lot 6 Click Score: 19    End of Session Equipment Utilized During Treatment: Gait belt Activity Tolerance: Patient tolerated treatment well Patient left: in bed;with call bell/phone within reach Nurse Communication: Mobility status PT Visit Diagnosis: Unsteadiness on feet (R26.81)    Time: 1735-1800 PT Time Calculation (min) (ACUTE ONLY): 25 min   Charges:   PT Evaluation $PT Eval Moderate Complexity: 1 Mod          Reinaldo Berber, PT, DPT Acute Rehabilitation Services Pager: [phone] Office: [phone]    Reinaldo Berber 10/28/2018, 5:09 PM

## 2018-10-28 NOTE — Plan of Care (Signed)
  Problem: Nutrition: Goal: Adequate nutrition will be maintained Outcome: Not Progressing   

## 2018-10-28 NOTE — Progress Notes (Signed)
Called [name] and Lattie Haw to give updates on patient.

## 2018-10-29 DIAGNOSIS — R7303 Prediabetes: Secondary | ICD-10-CM

## 2018-10-29 LAB — BASIC METABOLIC PANEL
Anion gap: 7 (ref 5–15)
BUN: 8 mg/dL (ref 8–23)
CO2: 24 mmol/L (ref 22–32)
Calcium: 8.3 mg/dL — ABNORMAL LOW (ref 8.9–10.3)
Chloride: 105 mmol/L (ref 98–111)
Creatinine, Ser: 0.72 mg/dL (ref 0.61–1.24)
GFR calc Af Amer: >60 mL/min (ref >60)
GFR calc non Af Amer: >60 mL/min (ref >60)
Glucose, Bld: 112 mg/dL — ABNORMAL HIGH (ref 70–99)
Potassium: 3.6 mmol/L (ref 3.5–5.1)
Sodium: 136 mmol/L (ref 135–145)

## 2018-10-29 LAB — GLUCOSE, CAPILLARY
Glucose-Capillary: 103 mg/dL — ABNORMAL HIGH (ref 70–99)
Glucose-Capillary: 105 mg/dL — ABNORMAL HIGH (ref 70–99)

## 2018-10-29 MED ORDER — TAMSULOSIN HCL 0.4 MG PO CAPS: 0.4000 mg | ORAL_CAPSULE | Freq: Every day | ORAL | 0 refills | Status: DC

## 2018-10-29 NOTE — TOC Initial Note (Signed)
Transition of Care [facility]) - Initial/Assessment Note    Patient Details  Name: [name] MRN: [id number] Date of Birth: [birth year]  Transition of Care [facility]) CM/SW Contact:    Bartholomew Crews, RN Phone Number: [phone] 10/29/2018, 11:59 AM  Clinical Narrative:                 Spoke with daughter ([name]) via phone. PTA home with wife. Discussed patient readiness to transition home. [name] advised that her mom and [name] would pick him up. Discussed HH needs and choice of agencies. Referral accepted by [facility] for RN, PT. Spoke with [employer]. Discussed coming inside the hospital for training on catheter care - agreed. Bedside nurse aware and will call to arrange time for discharge teaching with daughter. No other transition of care needs identified.   Expected Discharge Plan: [place] Barriers to Discharge: No Barriers Identified   Patient Goals and CMS Choice   CMS Medicare.gov Compare Post Acute Care list provided to:: Patient Represenative (must comment) Choice offered to / list presented to : Adult Children  Expected Discharge Plan and Services Expected Discharge Plan: [place] In-house Referral: NA Discharge Planning Services: CM Consult Post Acute Care Choice: [place] arrangements for the past 2 months: Single Family Home Expected Discharge Date: 10/29/18               DME Arranged: N/A DME Agency: NA       HH Arranged: RN, PT HH Agency: [facility] Date [place] Agency Contacted: 10/29/18 Time [place]: 1143 Representative spoke with at [place]: Dorian Pod  Prior Living Arrangements/Services Living arrangements for the past 2 months: [place] Lives with:: Self, Spouse Patient language and need for interpreter reviewed:: Yes        Need for Family Participation in Patient Care: Yes (Comment) Care giver support system in place?: Yes (comment)   Criminal Activity/Legal Involvement Pertinent  to Current Situation/Hospitalization: No - Comment as needed  Activities of Daily Living Home Assistive Devices/Equipment: None ADL Screening (condition at time of admission) Patient's cognitive ability adequate to safely complete daily activities?: Yes Is the patient deaf or have difficulty hearing?: No Does the patient have difficulty seeing, even when wearing glasses/contacts?: No Does the patient have difficulty concentrating, remembering, or making decisions?: No Patient able to express need for assistance with ADLs?: Yes Does the patient have difficulty dressing or bathing?: No Independently performs ADLs?: Yes (appropriate for developmental age) Does the patient have difficulty walking or climbing stairs?: Yes Weakness of Legs: None Weakness of Arms/Hands: None  Permission Sought/Granted                  Emotional Assessment              Admission diagnosis:  Hyperkalemia [E87.5] Urinary retention [R33.9] Acute renal failure, unspecified acute renal failure type [facility]) [N17.9] Patient Active Problem List   Diagnosis Date Noted  . AKI (acute kidney injury) ([place]) 10/26/2018  . Hyperkalemia 10/26/2018  . High anion gap metabolic acidosis 53/97/6734  . Bladder mass 10/26/2018  . Testicular swelling 09/17/2018  . Right inguinal hernia 09/17/2018  . Encounter for annual general medical examination with abnormal findings in adult 09/22/2017  . Recurrent falls 06/24/2016  . Right shoulder pain 10/18/2015  . Essential hypertension 10/17/2008  . History of myocardial infarction 10/17/2008  . GERD 10/17/2008  . HYPERLIPIDEMIA 03/05/2007  . Chronic ischemic heart disease 03/05/2007  .  SKIN LESIONS, MULTIPLE 03/05/2007   PCP:  Pleas Koch, NP Pharmacy:   [facility] [place] - [address] [place]) [place] [address] Phone: [phone] Fax: [phone]     Social Determinants of Health (SDOH)  Interventions    Readmission Risk Interventions No flowsheet data found.

## 2018-10-29 NOTE — Discharge Summary (Signed)
Physician Discharge Summary  [name] FMB:[id number] DOB: [birth year]  PCP: Pleas Koch, NP  Admit date: 10/26/2018 Discharge date: 10/29/2018  Recommendations for Outpatient Follow-up:  1. Loma Boston, NP/PCP in 1 week with repeat labs (CBC & BMP). 2. Dr. Alexis Frock, Urology: Patient has prior appointment on 11/26/2018 at 1:45 PM.  Discussed with 2 of his daughters and advised them to call the office to try and reschedule to an earlier appointment.  Home Health: PT and RN Equipment/Devices: Indwelling Foley catheter  Discharge Condition: Improved and stable CODE STATUS: Partial/DNI Diet recommendation: Heart healthy & diabetic diet.  Discharge Diagnoses:  Principal Problem:   AKI (acute kidney injury) ([place]) Active Problems:   Hyperkalemia   High anion gap metabolic acidosis   Bladder mass   Brief Summary: 83 year old male, lives with spouse and family, independent of activities, PMH of CAD, GERD, HLD, HTN, seen in ED 7/2 for dysuria and hematuria at which time CT showed a possible bladder versus prostate mass, creatinine 1.1, was discharged from ED with plans for Urology to contact him regarding follow-up/cystoscopy, presented to the ED 7/14 with 2-week history of difficulty urinating, poor fluid intake, some hematuria, noted to have creatinine of 21.5, potassium 7, bicarbonate 9 and in acute urinary retention.  He was admitted for severe acute kidney injury, suspected due to acute urinary retention from bladder/prostate mass, hyperkalemia, anion gap metabolic acidosis.  Foley catheter placed, Nephrology consulted.    Assessment & Plan:  Severe acute kidney injury, obstructive  10/14/2018: Creatinine 1.1.  On admission 7/14: Creatinine 21.49, potassium 7, bicarbonate 9, anion gap 30.  Acute urinary retention in ED, Foley catheter inserted and immediately drained approximately 800 mL of bloody urine.  Acute kidney injury likely related to obstructive  etiology. Recent CT renal study 7/2 which suggested bladder mass versus blood clot due to hematuria versus prostate mass invading bladder.  Most likely BPH.  Nephrology was consulted.    He was managed medically: Placement of Foley catheter, holding ACE inhibitors and IV fluids.  BMP was closely monitored.  Creatinine has normalized.  Follow BMP in a week's time as outpatient.  Acute kidney injury has resolved.  Hyperkalemia  Due to acute kidney injury.  Potassium 7 on admission.  Treated in ED with IV calcium, insulin, dextrose, placed on bicarbonate drip and Lokelma.  Resolved.    Anion gap metabolic acidosis  Due to acute kidney injury.  Resolved.  Hypernatremia  Sodium had steadily increased from 139 > 155 along with hyperchloremia  Possibly due to bicarbonate drip, changed to D5 half NS.  Sodium had improved but was still high at 151 yesterday.  Encouraged liberal water intake and placed on gentle IV D5 infusion for a few hours.  Hypernatremia resolved.  Acute urinary retention/BPH/??  Bladder versus prostate mass.  Imaging details as noted above.  Foley catheter placed this admission.  I discussed in detail with Dr. Alinda Money, Urologist on call on 7/15 who reviewed patient's recent CT and indicates that patient has a markedly enlarged prostate along with median lobe enlargement and suspects this is most likely BPH but malignancy needs to be ruled out.  He recommends continued Foley catheter at discharge, trial of Flomax, outpatient follow-up with Urology.  Patient has an appointment with Dr. Alexis Frock on 8/14 at 1:45 PM.  Patient/family is to call for an earlier appointment after discharge.  Essential hypertension  Home ACEI was temporarily held due to acute kidney injury  Continue metoprolol 50 mg twice  daily.  Although amlodipine 2.5 mg daily was initiated in the hospital, since patient will go back on his ramipril 2.5 mg daily at discharge, will not  discharge on amlodipine, avoiding hypotension.  Outpatient follow-up with PCP.  Hyperlipidemia  Continue statins.  CAD  Asymptomatic of chest pain.  Continue metoprolol and aspirin.  Leukocytosis  Likely stress response.  Improved.  No clinical focus of infection.  Urine culture negative.  Hyperglycemia/prediabetes  A1c 6.4.  Patient has been refusing CBG checks.  Mildly controlled at times.    Outpatient follow-up.  Acute metabolic encephalopathy  Due to acute kidney injury, mild hypernatremia complicating likely underlying dementia.  Mental status has improved and may be close to baseline at this time.  Large bilateral scrotal hydroceles  Noted on recent CT renal stone study 7/2.  Asymptomatic of pain.  Outpatient follow-up with Urology.    Consultants:  Nephrology  Procedures:  Foley catheter 7/14   Discharge Instructions  Discharge Instructions    Call MD for:   Complete by: As directed    Worsening confusion.   Call MD for:  difficulty breathing, headache or visual disturbances   Complete by: As directed    Call MD for:  extreme fatigue   Complete by: As directed    Call MD for:  persistant dizziness or light-headedness   Complete by: As directed    Call MD for:  persistant nausea and vomiting   Complete by: As directed    Call MD for:  severe uncontrolled pain   Complete by: As directed    Call MD for:  temperature >100.4   Complete by: As directed    Diet - low sodium heart healthy   Complete by: As directed    Diet Carb Modified   Complete by: As directed    Increase activity slowly   Complete by: As directed        Medication List    STOP taking these medications   sulfamethoxazole-trimethoprim 800-160 MG tablet Commonly known as: BACTRIM DS     TAKE these medications   aspirin EC 81 MG tablet Take 81 mg by mouth daily.   metoprolol tartrate 50 MG tablet Commonly known as: LOPRESSOR TAKE 1 TABLET BY MOUTH TWICE DAILY.  PLEASE KEEP UPCOMING APPT IN SEPTEMBER BEFORE ANYMORE REFILLS. What changed: See the new instructions.   ramipril 2.5 MG capsule Commonly known as: ALTACE Take 1 capsule (2.5 mg total) by mouth daily.   simvastatin 10 MG tablet Commonly known as: ZOCOR TAKE 1 TABLET BY MOUTH AT BEDTIME. PLEASE KEEP UPCOMING APPT IN SEPTEMBER BEFORE ANYMORE REFILLS. What changed: See the new instructions.   tamsulosin 0.4 MG Caps capsule Commonly known as: FLOMAX Take 1 capsule (0.4 mg total) by mouth daily. Start taking on: October 30, 2018      Follow-up Information    Pleas Koch, NP. Schedule an appointment as soon as possible for a visit in 1 week(s).   Specialty: Internal Medicine Why: To be seen with repeat labs (CBC & BMP). Contact information: [place] [address] [phone]        Alexis Frock, MD Follow up on 11/26/2018.   Specialty: Urology Why: 1:45 PM.  Please call the office for an earlier appointment. Contact information: [place] [address] [phone]          No Known Allergies    Procedures/Studies: Ct Renal Stone Study  Result Date: 10/14/2018 CLINICAL DATA:  83 year old  male with gross hematuria and difficulty urinating for 2 days. EXAM: CT ABDOMEN AND PELVIS WITHOUT CONTRAST TECHNIQUE: Multidetector CT imaging of the abdomen and pelvis was performed following the standard protocol without IV contrast. COMPARISON:  None. FINDINGS: Lower chest: Negative. Hepatobiliary: Surgically absent gallbladder. Negative noncontrast liver. Pancreas: Negative. Spleen: Negative. Adrenals/Urinary Tract: Normal adrenal glands. Left renal midpole scarring associated with nephrocalcinosis and/or nephrolithiasis up to 6 millimeters (series 3, image 26). There is no left hydronephrosis or perinephric stranding. The left ureter is unremarkable to the bladder. Negative noncontrast right kidney. Negative right ureter to the urinary bladder.  Relatively small bladder volume. Bladder is heterogeneous and abnormal with rounded masslike area at the base of the bladder contiguous with the prostate (sagittal series 7, image 53 and series 3, image 72) as well as posterior bladder wall thickening up to 2 centimeters. No perivesical stranding. Stomach/Bowel: Decompressed and negative descending and rectosigmoid colon. Gas distended transverse colon is redundant. Gas and stool in the right colon. No large bowel inflammation. Diminutive or absent appendix. Negative terminal ileum. No dilated small bowel, there are gas-filled loops of jejunum in the left upper quadrant. The stomach is decompressed. No free air, free fluid. Vascular/Lymphatic: Extensive Aortoiliac calcified atherosclerosis. Vascular patency is not evaluated in the absence of IV contrast. No lymphadenopathy. Reproductive: Relatively large bilateral scrotal hydroceles (series 8, images 1 and 5). Inseparable appearance of the prostate from the abnormal base of the urinary bladder (sagittal image 53) as stated above. Other: No pelvic free fluid. Musculoskeletal: Degenerative changes in the spine and pelvis. No acute or suspicious osseous lesion identified. IMPRESSION: 1. Abnormal urinary bladder with differential considerations of bladder mass, blood clot due to hematuria, and prostate mass invading the bladder. No pelvic lymphadenopathy. Recommend Urology consultation. 2. Negative noncontrast kidneys aside from left renal midpole scarring and nephrocalcinosis/nephrolithiasis. 3. Large bilateral scrotal hydroceles. 4.  Aortic Atherosclerosis (ICD10-I70.0). Electronically Signed   By: Genevie Ann M.D.   On: 10/14/2018 15:28      Subjective: Patient denies complaints.  No pain reported.  Tolerating diet without nausea or vomiting.  No dysuria or penile pain.  Patient states that he was "tickled" when he was informed that he is going home today.  As per RN, no acute issues noted.  Discharge  Exam:  Vitals:   10/28/18 1630 10/28/18 2102 10/29/18 0504 10/29/18 0910  BP: 139/74 (!) 152/76 (!) 146/74 133/74  Pulse: 85 (!) 102 85 96  Resp: 18 16 15 18   Temp: 98 F (36.7 C) 98 F (36.7 C) 97.8 F (36.6 C) 97.6 F (36.4 C)  TempSrc: Oral Oral Oral Oral  SpO2: 98% 98% 97% 98%  Weight:  45.2 kg    Height:        General exam: Pleasant elderly male, small built and thinly nourished, lying comfortably propped up in bed.  Oral mucosa moist. Respiratory system: Clear to auscultation.  No wheezing, rhonchi or crackles.  No increased work of breathing. Cardiovascular system: S1 and S2 heard, RRR.  No JVD, murmurs or pedal edema.   Gastrointestinal system: Abdomen is nondistended, soft and nontender. No organomegaly or masses felt. Normal bowel sounds heard. Central nervous system: Alert and oriented to person, place but not time.  Follows simple instructions. No focal neurological deficits. Extremities: Symmetric 5 x 5 power. Skin: No rashes, lesions or ulcers Psychiatry: Judgement and insight impaired. Mood & affect pleasant.  GU: Indwelling Foley catheter with straw-colored urine and no gross hematuria.    The  results of significant diagnostics from this hospitalization (including imaging, microbiology, ancillary and laboratory) are listed below for reference.     Microbiology: Recent Results (from the past 240 hour(s))  Urine culture     Status: None   Collection Time: 10/26/18 12:55 PM   Specimen: Urine, Random  Result Value Ref Range Status   Specimen Description URINE, RANDOM  Final   Special Requests NONE  Final   Culture   Final    NO GROWTH Performed at [facility], [address], [place], [place] [address]    Report Status 10/27/2018 FINAL  Final  SARS Coronavirus 2 (CEPHEID - Performed in [place] hospital lab), Hosp Order     Status: None   Collection Time: 10/26/18  5:44 PM   Specimen: Nasopharyngeal Swab  Result Value Ref Range Status   SARS  Coronavirus 2 NEGATIVE NEGATIVE Final    Comment: (NOTE) If result is NEGATIVE SARS-CoV-2 target nucleic acids are NOT DETECTED. The SARS-CoV-2 RNA is generally detectable in upper and lower  respiratory specimens during the acute phase of infection. The lowest  concentration of SARS-CoV-2 viral copies this assay can detect is 250  copies / mL. A negative result does not preclude SARS-CoV-2 infection  and should not be used as the sole basis for treatment or other  patient management decisions.  A negative result may occur with  improper specimen collection / handling, submission of specimen other  than nasopharyngeal swab, presence of viral mutation(s) within the  areas targeted by this assay, and inadequate number of viral copies  (<250 copies / mL). A negative result must be combined with clinical  observations, patient history, and epidemiological information. If result is POSITIVE SARS-CoV-2 target nucleic acids are DETECTED. The SARS-CoV-2 RNA is generally detectable in upper and lower  respiratory specimens dur ing the acute phase of infection.  Positive  results are indicative of active infection with SARS-CoV-2.  Clinical  correlation with patient history and other diagnostic information is  necessary to determine patient infection status.  Positive results do  not rule out bacterial infection or co-infection with other viruses. If result is PRESUMPTIVE POSTIVE SARS-CoV-2 nucleic acids MAY BE PRESENT.   A presumptive positive result was obtained on the submitted specimen  and confirmed on repeat testing.  While 2019 novel coronavirus  (SARS-CoV-2) nucleic acids may be present in the submitted sample  additional confirmatory testing may be necessary for epidemiological  and / or clinical management purposes  to differentiate between  SARS-CoV-2 and other Sarbecovirus currently known to infect humans.  If clinically indicated additional testing with an alternate test   methodology [phone]) is advised. The SARS-CoV-2 RNA is generally  detectable in upper and lower respiratory sp ecimens during the acute  phase of infection. The expected result is Negative. Fact Sheet for Patients:  StrictlyIdeas.no Fact Sheet for Healthcare Providers: BankingDealers.co.za This test is not yet approved or cleared by the [place] FDA and has been authorized for detection and/or diagnosis of SARS-CoV-2 by FDA under an Emergency Use Authorization (EUA).  This EUA will remain in effect (meaning this test can be used) for the duration of the COVID-19 declaration under Section 564(b)(1) of the Act, 21 U.S.C. section 360bbb-3(b)(1), unless the authorization is terminated or revoked sooner. Performed at [facility], [place] [address], [place], [place] [address]      Labs: CBC: Recent Labs  Lab 10/26/18 1240 10/27/18 0812  WBC 16.4* 12.1*  NEUTROABS 14.9*  --  HGB 13.0 12.4*  HCT 39.3 36.8*  MCV 88.5 85.4  PLT 283 440   Basic Metabolic Panel: Recent Labs  Lab 10/26/18 1629 10/26/18 2024 10/27/18 0812 10/28/18 0502 10/29/18 0506  NA 140 147* 155* 151* 136  K 5.9* 4.7 3.9 3.8 3.6  CL 106 115* 117* 118* 105  CO2 15* 14* 27 26 24   GLUCOSE 174* 158* 196* 110* 112*  BUN 212* 153* 61* 17 8  CREATININE 17.29* 10.99* 2.72* 0.91 0.72  CALCIUM 9.2 9.0 8.7* 8.8* 8.3*  MG  --   --  2.2  --   --   PHOS  --   --  2.7  --   --    Liver Function Tests: Recent Labs  Lab 10/26/18 1240 10/26/18 1629 10/27/18 0812  AST 24 25  --   ALT 34 35  --   ALKPHOS 69 68  --   BILITOT 0.5 0.6  --   PROT 6.5 6.5  --   ALBUMIN 2.9* 2.7* 2.7*   CBG: Recent Labs  Lab 10/26/18 1633 10/28/18 1136 10/28/18 1631 10/28/18 2103 10/29/18 0712  GLUCAP 156* 109* 150* 149* 103*   Hgb A1c Recent Labs    10/27/18 1640  HGBA1C 6.4*   Urinalysis    Component Value Date/Time   COLORURINE AMBER (A) 10/26/2018 1430    APPEARANCEUR TURBID (A) 10/26/2018 1430   LABSPEC 1.012 10/26/2018 1430   PHURINE 5.0 10/26/2018 1430   GLUCOSEU 50 (A) 10/26/2018 1430   HGBUR LARGE (A) 10/26/2018 1430   HGBUR negative 04/17/2010 1535   BILIRUBINUR NEGATIVE 10/26/2018 1430   BILIRUBINUR 1+ 09/17/2018 1554   KETONESUR NEGATIVE 10/26/2018 1430   PROTEINUR 100 (A) 10/26/2018 1430   UROBILINOGEN 2.0 (A) 09/17/2018 1554   UROBILINOGEN 0.2 04/17/2010 1535   NITRITE NEGATIVE 10/26/2018 1430   LEUKOCYTESUR SMALL (A) 10/26/2018 1430    I discussed in detail with patient's daughter via phone, updated care and answered all questions.  Time coordinating discharge: 40 minutes  SIGNED:  Vernell Leep, MD, FACP, [facility]. [facility]  To contact the attending provider between 7A-7P or the covering provider during after hours 7P-7A, please log into the web site [email or web address] and access using universal [place] password for that web site. If you do not have the password, please call the hospital operator.

## 2018-10-29 NOTE — Discharge Instructions (Signed)

## 2018-10-29 NOTE — Progress Notes (Addendum)
DISCHARGE NOTE HOME Steve DELAUGHTER to be discharged Home with Daughter Taeveon Keesling and instructions were given about foley care, per MD order. Discussed prescriptions and follow up appointments with the patient. Prescriptions given to patient; medication list explained in detail. Patient verbalized understanding.  Skin clean, dry and intact without evidence of skin break down, no evidence of skin tears noted. IV catheter discontinued intact. Site without signs and symptoms of complications. Dressing and pressure applied. Pt denies pain at the site currently. No complaints noted.  Patient free of lines, drains, and wounds.   An After Visit Summary (AVS) was printed and given to the patient. Patient escorted via wheelchair, and discharged home via private auto.  Arlyss Repress, RN

## 2018-10-31 DIAGNOSIS — M19012 Primary osteoarthritis, left shoulder: Secondary | ICD-10-CM | POA: Diagnosis not present

## 2018-10-31 DIAGNOSIS — I252 Old myocardial infarction: Secondary | ICD-10-CM | POA: Diagnosis not present

## 2018-10-31 DIAGNOSIS — I2510 Atherosclerotic heart disease of native coronary artery without angina pectoris: Secondary | ICD-10-CM | POA: Diagnosis not present

## 2018-10-31 DIAGNOSIS — Z466 Encounter for fitting and adjustment of urinary device: Secondary | ICD-10-CM | POA: Diagnosis not present

## 2018-10-31 DIAGNOSIS — R69 Illness, unspecified: Secondary | ICD-10-CM | POA: Diagnosis not present

## 2018-10-31 DIAGNOSIS — I255 Ischemic cardiomyopathy: Secondary | ICD-10-CM | POA: Diagnosis not present

## 2018-10-31 DIAGNOSIS — R32 Unspecified urinary incontinence: Secondary | ICD-10-CM | POA: Diagnosis not present

## 2018-10-31 DIAGNOSIS — I10 Essential (primary) hypertension: Secondary | ICD-10-CM | POA: Diagnosis not present

## 2018-10-31 DIAGNOSIS — M19011 Primary osteoarthritis, right shoulder: Secondary | ICD-10-CM | POA: Diagnosis not present

## 2018-10-31 DIAGNOSIS — E785 Hyperlipidemia, unspecified: Secondary | ICD-10-CM | POA: Diagnosis not present

## 2018-11-01 ENCOUNTER — Telehealth: Payer: Self-pay

## 2018-11-01 DIAGNOSIS — I252 Old myocardial infarction: Secondary | ICD-10-CM | POA: Diagnosis not present

## 2018-11-01 DIAGNOSIS — R69 Illness, unspecified: Secondary | ICD-10-CM | POA: Diagnosis not present

## 2018-11-01 DIAGNOSIS — Z466 Encounter for fitting and adjustment of urinary device: Secondary | ICD-10-CM | POA: Diagnosis not present

## 2018-11-01 DIAGNOSIS — I255 Ischemic cardiomyopathy: Secondary | ICD-10-CM | POA: Diagnosis not present

## 2018-11-01 DIAGNOSIS — M19012 Primary osteoarthritis, left shoulder: Secondary | ICD-10-CM | POA: Diagnosis not present

## 2018-11-01 DIAGNOSIS — I10 Essential (primary) hypertension: Secondary | ICD-10-CM | POA: Diagnosis not present

## 2018-11-01 DIAGNOSIS — I2510 Atherosclerotic heart disease of native coronary artery without angina pectoris: Secondary | ICD-10-CM | POA: Diagnosis not present

## 2018-11-01 DIAGNOSIS — E785 Hyperlipidemia, unspecified: Secondary | ICD-10-CM | POA: Diagnosis not present

## 2018-11-01 DIAGNOSIS — M19011 Primary osteoarthritis, right shoulder: Secondary | ICD-10-CM | POA: Diagnosis not present

## 2018-11-01 DIAGNOSIS — R32 Unspecified urinary incontinence: Secondary | ICD-10-CM | POA: Diagnosis not present

## 2018-11-01 NOTE — Telephone Encounter (Signed)
Would recommend Tylenol [phone] mg every 8 hours

## 2018-11-01 NOTE — Telephone Encounter (Signed)
Transition Care Management Follow-up Telephone Call   Date discharged?10/29/18    How have you been since you were released from the hospital? "I guess I am half alright, but I do feel some better except I am having a lot of pain in my shoulders.  This is an ongoing problem but has gotten worse in recent weeks.  I have history of falls.  Would like to know if there is anything I can try for the shoulder pain.  Patient is aware that Allie Bossier, NP is out of the office today but that I will forward to a provider covering for her for advice.  Patient has f/u with Allie Bossier, Np this Wednesday in the office.     Do you understand why you were in the hospital? Not really!  This Probation officer reviewed discharge instructions and reason for stay with patient, he verbalizes understanding.    Do you understand the discharge instructions? (see above)   Where were you discharged to? Home with home health, Jackquline Denmark was out at the home today to initiate services.    Items Reviewed:  Medications reviewed: Yes  Allergies reviewed: Yes  Dietary changes reviewed: heart healthy/ diabetic  Referrals reviewed: Yes, home health referral and urology.    Functional Questionnaire:   Activities of Daily Living (ADLs):   He states they are independent in the following: dressing, feeding,  States they require assistance with the following: foley catheter, ambulating with assistance   Any transportation issues/concerns?: none   Any patient concerns? Would like something for his pain.  See above note forwarded to covering provider.    Confirmed importance and date/time of follow-up visits scheduled Yes  Provider Appointment booked with  Allie Bossier, NP for 11/03/18  Confirmed with patient if condition begins to worsen call PCP or go to the ER.  Patient was given the office number and encouraged to call back with question or concerns.  : Yes

## 2018-11-01 NOTE — Telephone Encounter (Signed)
During hospital follow up call, patient expresses concern over high level of pain.  When assessing pain, he states that it is located to Bilateral shoulders and is left over from old shoulder injuries he suffered from 2 falls.   He denies any recent falls or injuries but states pain has been worse in recent weeks. Does notice some tingling down his left arm from shoulder but describes as an ache and gets sharper when he "holds the phone up to his ear on that side".  But does feel the discomfort in both shoulders.   He is concerned with being able to be mobile enough to come in to see PCP on Wednesday due to increased pain. Patient would like to know what he can take to help with his pain until his f/u with PCP?  He has not used anything over the counter yet and per hospital discharge is suffering from bladder mass, acute kidney injury and has a foley in place.  Please advise for pain control.   Patient is aware that it will be tomorrow before he hears back and is okay with that.

## 2018-11-02 ENCOUNTER — Telehealth: Payer: Self-pay | Admitting: Internal Medicine

## 2018-11-02 NOTE — Telephone Encounter (Signed)
See message from [place], I appreciate the help of all involved.

## 2018-11-02 NOTE — Telephone Encounter (Signed)
Patient's wife notified and verbalizes understanding.

## 2018-11-02 NOTE — Telephone Encounter (Signed)
[place] Physical therapist. She saw patient on 7/19  due to hospital admission for acute renal failure. The nurse says that the heart sounded as if he had an irregular, irregular heartbeat. His HR was hard to get but it was in the range of 90-105. The rest of the vitals was normal and the patient was asymptomatic. The only other symptom was that he was tired.  She just wanted to provide an update. Another nurse is to see the patient today.

## 2018-11-03 ENCOUNTER — Encounter: Payer: Self-pay | Admitting: Primary Care

## 2018-11-03 ENCOUNTER — Other Ambulatory Visit: Payer: Self-pay

## 2018-11-03 ENCOUNTER — Ambulatory Visit (INDEPENDENT_AMBULATORY_CARE_PROVIDER_SITE_OTHER): Payer: Medicare HMO | Admitting: Primary Care

## 2018-11-03 VITALS — BP 104/56 | HR 68 | Temp 98.2°F

## 2018-11-03 DIAGNOSIS — I10 Essential (primary) hypertension: Secondary | ICD-10-CM

## 2018-11-03 DIAGNOSIS — N179 Acute kidney failure, unspecified: Secondary | ICD-10-CM | POA: Diagnosis not present

## 2018-11-03 DIAGNOSIS — Z09 Encounter for follow-up examination after completed treatment for conditions other than malignant neoplasm: Secondary | ICD-10-CM

## 2018-11-03 DIAGNOSIS — N3289 Other specified disorders of bladder: Secondary | ICD-10-CM | POA: Diagnosis not present

## 2018-11-03 DIAGNOSIS — E875 Hyperkalemia: Secondary | ICD-10-CM | POA: Diagnosis not present

## 2018-11-03 LAB — CBC
HCT: 38.4 % — ABNORMAL LOW (ref 39.0–52.0)
Hemoglobin: 12.3 g/dL — ABNORMAL LOW (ref 13.0–17.0)
MCHC: 32.1 g/dL (ref 30.0–36.0)
MCV: 89.3 fl (ref 78.0–100.0)
Platelets: 366 10*3/uL (ref 150.0–400.0)
RBC: 4.3 Mil/uL (ref 4.22–5.81)
RDW: 13 % (ref 11.5–15.5)
WBC: 11.6 10*3/uL — ABNORMAL HIGH (ref 4.0–10.5)

## 2018-11-03 LAB — BASIC METABOLIC PANEL
BUN: 18 mg/dL (ref 6–23)
CO2: 30 mEq/L (ref 19–32)
Calcium: 9.4 mg/dL (ref 8.4–10.5)
Chloride: 99 mEq/L (ref 96–112)
Creatinine, Ser: 0.67 mg/dL (ref 0.40–1.50)
GFR: 113.89 mL/min (ref >60.00)
Glucose, Bld: 104 mg/dL — ABNORMAL HIGH (ref 70–99)
Potassium: 4.5 mEq/L (ref 3.5–5.1)
Sodium: 137 mEq/L (ref 135–145)

## 2018-11-03 NOTE — Assessment & Plan Note (Signed)
Occurred prior to and during hospitalization. Improved upon discharge. Strongly advised he increase water intake. Repeat BMP pending. Hold ACE given borderline hypotension.

## 2018-11-03 NOTE — Progress Notes (Signed)
Subjective:    Patient ID: [name], male    DOB: 02/09/1936, 83 y.o.   MRN: [id number]  HPI  [name] is an 83 year old male who presents today for TCM hospital follow up. He called into our office on 10/25/18 with reports of a two week history of urinary retention. We directed him to the nearest emergency department for evaluation and he declined.  He originally presented to [facility] on 10/14/18 with a chief complaint of dysuria.  Work up suspicious for either bladder cancer, prostate mass, blood clots from hematuria so Urology evaluation was recommended for cystoscopy and further evaluation.   He presented again to [facility] on 10/26/2018 with reports of urinary retention and diarrhea. Also with decrease in oral intake and hematuria.  He endorsed that he never followed up with Urology as recommended.  Work-up included hyperkalemia with acute kidney injury, metabolic acidosis.  Approximately 800 mils of bloody urine were drained via Foley catheter in ED.  He was admitted for further evaluation and treatment.  During his hospital stay nephrology consulted, patient treated for hyperkalemia in the ED which showed gradual improvement throughout his stay.  His ramipril was held due to acute kidney injury so amlodipine 2.5 mg was added to his regimen.  Patient developed hypernatremia which was thought to be secondary to bicarbonate drip which was discontinued.  Hypernatremia resolved prior to discharge.  Urology consulted who recommended continued Foley catheter placement, trial of Flomax, and follow-up with urology in the outpatient setting.  He was discharged home on 10/29/2018, ramipril was resumed and amlodipine was discontinued.  Since his hospital discharge home he's feeling better. He's been visited by home health physical therapy and home health nursing once. His family member is with him today who endorses some intermittent confusion.   He is drinking 1/2 of a bottle of water daily, also  some coffee. He tells [place] today that he is "drinking plenty of water" daily. He has an appointment with the Urologist scheduled for July 27th. He is compliant to his Flomax and ramipril.  He and his family are not checking his blood pressure at home.  BP Readings from Last 3 Encounters:  11/03/18 (!) 104/56  10/29/18 133/74  10/14/18 (!) 162/74     Review of Systems  Eyes: Negative for visual disturbance.  Respiratory: Negative for shortness of breath.   Cardiovascular: Negative for chest pain.  Neurological: Negative for dizziness.  Psychiatric/Behavioral: Positive for confusion.       Past Medical History:  Diagnosis Date  . Cataract 2020   bilateral cataracts  . Coronary artery disease   . Epididymitis    Left  . GERD (gastroesophageal reflux disease)   . Hyperlipidemia   . Hypertension   . MI (myocardial infarction) ([place])    first at age 51  . Routine general medical examination at a health care facility   . Skin lesions, generalized      Social History   Socioeconomic History  . Marital status: Married    Spouse name: BETTY  . Number of children: 6  . Years of education: 48  . Highest education level: 8th grade  Occupational History  . Occupation: retired Manufacturing systems engineer  . Financial resource strain: Not hard at all  . Food insecurity    Worry: Never true    Inability: Never true  . Transportation needs    Medical: No    Non-medical: No  Tobacco Use  . Smoking status: Former  Smoker    Packs/day: 1.00    Years: 30.00    Pack years: 30.00    Quit date: 04/14/1986    Years since quitting: 32.5  . Smokeless tobacco: Never Used  Substance and Sexual Activity  . Alcohol use: No  . Drug use: No  . Sexual activity: Not Currently    Birth control/protection: None  Lifestyle  . Physical activity    Days per week: 0 days    Minutes per session: 0 min  . Stress: Not at all  Relationships  . Social connections    Talks on phone: More than three  times a week    Gets together: More than three times a week    Attends religious service: 1 to 4 times per year    Active member of club or organization: No    Attends meetings of clubs or organizations: Never    Relationship status: Married  . Intimate partner violence    Fear of current or ex partner: No    Emotionally abused: No    Physically abused: No    Forced sexual activity: No  Other Topics Concern  . Not on file  Social History Narrative   Married.   6 children, 12 grandchildren, 12 great grandchildren.   Once worked as a Games developer.   Enjoys relaxing, spending time with family.    Past Surgical History:  Procedure Laterality Date  . APPENDECTOMY    . CARDIAC CATHETERIZATION     MI x4 with stents   . CHOLECYSTECTOMY    . HIATAL HERNIA REPAIR    . INGUINAL HERNIA REPAIR    . TONSILLECTOMY      Family History  Problem Relation Age of Onset  . Asthma Mother     No Known Allergies  Current Outpatient Medications on File Prior to Visit  Medication Sig Dispense Refill  . aspirin EC 81 MG tablet Take 81 mg by mouth daily.    . metoprolol tartrate (LOPRESSOR) 50 MG tablet TAKE 1 TABLET BY MOUTH TWICE DAILY. PLEASE KEEP UPCOMING APPT IN SEPTEMBER BEFORE ANYMORE REFILLS. (Patient taking differently: Take 50 mg by mouth 2 (two) times daily. ) 180 tablet 3  . ramipril (ALTACE) 2.5 MG capsule Take 1 capsule (2.5 mg total) by mouth daily. 90 capsule 3  . simvastatin (ZOCOR) 10 MG tablet TAKE 1 TABLET BY MOUTH AT BEDTIME. PLEASE KEEP UPCOMING APPT IN SEPTEMBER BEFORE ANYMORE REFILLS. (Patient taking differently: Take 10 mg by mouth at bedtime. ) 90 tablet 2  . tamsulosin (FLOMAX) 0.4 MG CAPS capsule Take 1 capsule (0.4 mg total) by mouth daily. 30 capsule 0   No current facility-administered medications on file prior to visit.     BP (!) 104/56   Pulse 68   Temp 98.2 F (36.8 C) (Tympanic)   SpO2 (!) 89%    Objective:   Physical Exam  Constitutional: He is oriented  to person, place, and time. He appears well-nourished.  Neck: Neck supple.  Cardiovascular: Normal rate and regular rhythm.  Respiratory: Effort normal and breath sounds normal.  Genitourinary:    Genitourinary Comments: Dark clear urine noted in Foley bag.  Several small blood clots noted in Foley tubing.   Neurological: He is alert and oriented to person, place, and time.  He admits today that he has been confused recently  Skin: Skin is warm and dry.  Psychiatric: He has a normal mood and affect.  Assessment & Plan:

## 2018-11-03 NOTE — Assessment & Plan Note (Signed)
Thought to be secondary to prostate mass and will be following up with urology next week for cystoscopy and further evaluation.  Continue tamsulosin daily. Foley catheter appears well maintained. Strongly advised he increase water intake. Repeat CBC and BMP pending.

## 2018-11-03 NOTE — Assessment & Plan Note (Signed)
Borderline hypotensive today. Given his age and obvious dehydration we will hold ramipril for now.  Continue metoprolol tartrate given history of ischemic heart disease.  Consider reducing dose to 25 mg twice daily.  We will have family check blood pressure and report continued low readings and/or readings of at or above 140/90.  Repeat BMP pending.

## 2018-11-03 NOTE — Assessment & Plan Note (Signed)
Repeat BMP pending. Holding ACE due to hypotension.

## 2018-11-03 NOTE — Patient Instructions (Addendum)
Hold the ramipril 2.5 mg for the blood pressure for now. Continue taking metoprolol tartrate 50 mg for blood pressure and heart rate.   Monitor your blood pressure daily and notify me if readings become at or above 140/90.  Stop by the lab prior to leaving today. I will notify you of your results once received.   Follow up with Urology as scheduled.  Please schedule a follow up appointment in 6 months.   It was a pleasure to see you today!

## 2018-11-08 DIAGNOSIS — N401 Enlarged prostate with lower urinary tract symptoms: Secondary | ICD-10-CM | POA: Diagnosis not present

## 2018-11-08 DIAGNOSIS — R69 Illness, unspecified: Secondary | ICD-10-CM | POA: Diagnosis not present

## 2018-11-08 DIAGNOSIS — I252 Old myocardial infarction: Secondary | ICD-10-CM | POA: Diagnosis not present

## 2018-11-08 DIAGNOSIS — I10 Essential (primary) hypertension: Secondary | ICD-10-CM | POA: Diagnosis not present

## 2018-11-08 DIAGNOSIS — R32 Unspecified urinary incontinence: Secondary | ICD-10-CM | POA: Diagnosis not present

## 2018-11-08 DIAGNOSIS — E785 Hyperlipidemia, unspecified: Secondary | ICD-10-CM | POA: Diagnosis not present

## 2018-11-08 DIAGNOSIS — N200 Calculus of kidney: Secondary | ICD-10-CM | POA: Diagnosis not present

## 2018-11-08 DIAGNOSIS — I255 Ischemic cardiomyopathy: Secondary | ICD-10-CM | POA: Diagnosis not present

## 2018-11-08 DIAGNOSIS — M19012 Primary osteoarthritis, left shoulder: Secondary | ICD-10-CM | POA: Diagnosis not present

## 2018-11-08 DIAGNOSIS — R338 Other retention of urine: Secondary | ICD-10-CM | POA: Diagnosis not present

## 2018-11-08 DIAGNOSIS — I2510 Atherosclerotic heart disease of native coronary artery without angina pectoris: Secondary | ICD-10-CM | POA: Diagnosis not present

## 2018-11-08 DIAGNOSIS — M19011 Primary osteoarthritis, right shoulder: Secondary | ICD-10-CM | POA: Diagnosis not present

## 2018-11-08 DIAGNOSIS — R310 Gross hematuria: Secondary | ICD-10-CM | POA: Diagnosis not present

## 2018-11-08 DIAGNOSIS — Z466 Encounter for fitting and adjustment of urinary device: Secondary | ICD-10-CM | POA: Diagnosis not present

## 2018-11-09 DIAGNOSIS — I255 Ischemic cardiomyopathy: Secondary | ICD-10-CM | POA: Diagnosis not present

## 2018-11-09 DIAGNOSIS — R69 Illness, unspecified: Secondary | ICD-10-CM | POA: Diagnosis not present

## 2018-11-09 DIAGNOSIS — I10 Essential (primary) hypertension: Secondary | ICD-10-CM | POA: Diagnosis not present

## 2018-11-09 DIAGNOSIS — E785 Hyperlipidemia, unspecified: Secondary | ICD-10-CM | POA: Diagnosis not present

## 2018-11-09 DIAGNOSIS — R32 Unspecified urinary incontinence: Secondary | ICD-10-CM | POA: Diagnosis not present

## 2018-11-09 DIAGNOSIS — Z466 Encounter for fitting and adjustment of urinary device: Secondary | ICD-10-CM | POA: Diagnosis not present

## 2018-11-09 DIAGNOSIS — M19011 Primary osteoarthritis, right shoulder: Secondary | ICD-10-CM | POA: Diagnosis not present

## 2018-11-09 DIAGNOSIS — M19012 Primary osteoarthritis, left shoulder: Secondary | ICD-10-CM | POA: Diagnosis not present

## 2018-11-09 DIAGNOSIS — I252 Old myocardial infarction: Secondary | ICD-10-CM | POA: Diagnosis not present

## 2018-11-09 DIAGNOSIS — I2510 Atherosclerotic heart disease of native coronary artery without angina pectoris: Secondary | ICD-10-CM | POA: Diagnosis not present

## 2018-11-11 DIAGNOSIS — Z466 Encounter for fitting and adjustment of urinary device: Secondary | ICD-10-CM | POA: Diagnosis not present

## 2018-11-11 DIAGNOSIS — R69 Illness, unspecified: Secondary | ICD-10-CM | POA: Diagnosis not present

## 2018-11-11 DIAGNOSIS — R32 Unspecified urinary incontinence: Secondary | ICD-10-CM | POA: Diagnosis not present

## 2018-11-11 DIAGNOSIS — M19011 Primary osteoarthritis, right shoulder: Secondary | ICD-10-CM | POA: Diagnosis not present

## 2018-11-11 DIAGNOSIS — I10 Essential (primary) hypertension: Secondary | ICD-10-CM | POA: Diagnosis not present

## 2018-11-11 DIAGNOSIS — I252 Old myocardial infarction: Secondary | ICD-10-CM | POA: Diagnosis not present

## 2018-11-11 DIAGNOSIS — I2510 Atherosclerotic heart disease of native coronary artery without angina pectoris: Secondary | ICD-10-CM | POA: Diagnosis not present

## 2018-11-11 DIAGNOSIS — E785 Hyperlipidemia, unspecified: Secondary | ICD-10-CM | POA: Diagnosis not present

## 2018-11-11 DIAGNOSIS — M19012 Primary osteoarthritis, left shoulder: Secondary | ICD-10-CM | POA: Diagnosis not present

## 2018-11-11 DIAGNOSIS — I255 Ischemic cardiomyopathy: Secondary | ICD-10-CM | POA: Diagnosis not present

## 2018-11-12 DIAGNOSIS — Z466 Encounter for fitting and adjustment of urinary device: Secondary | ICD-10-CM | POA: Diagnosis not present

## 2018-11-12 DIAGNOSIS — M19012 Primary osteoarthritis, left shoulder: Secondary | ICD-10-CM | POA: Diagnosis not present

## 2018-11-12 DIAGNOSIS — I2510 Atherosclerotic heart disease of native coronary artery without angina pectoris: Secondary | ICD-10-CM | POA: Diagnosis not present

## 2018-11-12 DIAGNOSIS — R69 Illness, unspecified: Secondary | ICD-10-CM | POA: Diagnosis not present

## 2018-11-12 DIAGNOSIS — I252 Old myocardial infarction: Secondary | ICD-10-CM | POA: Diagnosis not present

## 2018-11-12 DIAGNOSIS — E785 Hyperlipidemia, unspecified: Secondary | ICD-10-CM | POA: Diagnosis not present

## 2018-11-12 DIAGNOSIS — M19011 Primary osteoarthritis, right shoulder: Secondary | ICD-10-CM | POA: Diagnosis not present

## 2018-11-12 DIAGNOSIS — R32 Unspecified urinary incontinence: Secondary | ICD-10-CM | POA: Diagnosis not present

## 2018-11-12 DIAGNOSIS — I255 Ischemic cardiomyopathy: Secondary | ICD-10-CM | POA: Diagnosis not present

## 2018-11-12 DIAGNOSIS — I10 Essential (primary) hypertension: Secondary | ICD-10-CM | POA: Diagnosis not present

## 2018-11-15 DIAGNOSIS — M19011 Primary osteoarthritis, right shoulder: Secondary | ICD-10-CM | POA: Diagnosis not present

## 2018-11-15 DIAGNOSIS — R32 Unspecified urinary incontinence: Secondary | ICD-10-CM | POA: Diagnosis not present

## 2018-11-15 DIAGNOSIS — I2510 Atherosclerotic heart disease of native coronary artery without angina pectoris: Secondary | ICD-10-CM | POA: Diagnosis not present

## 2018-11-15 DIAGNOSIS — R69 Illness, unspecified: Secondary | ICD-10-CM | POA: Diagnosis not present

## 2018-11-15 DIAGNOSIS — Z466 Encounter for fitting and adjustment of urinary device: Secondary | ICD-10-CM | POA: Diagnosis not present

## 2018-11-15 DIAGNOSIS — M19012 Primary osteoarthritis, left shoulder: Secondary | ICD-10-CM | POA: Diagnosis not present

## 2018-11-15 DIAGNOSIS — E785 Hyperlipidemia, unspecified: Secondary | ICD-10-CM | POA: Diagnosis not present

## 2018-11-15 DIAGNOSIS — I10 Essential (primary) hypertension: Secondary | ICD-10-CM | POA: Diagnosis not present

## 2018-11-15 DIAGNOSIS — I252 Old myocardial infarction: Secondary | ICD-10-CM | POA: Diagnosis not present

## 2018-11-15 DIAGNOSIS — I255 Ischemic cardiomyopathy: Secondary | ICD-10-CM | POA: Diagnosis not present

## 2018-11-18 DIAGNOSIS — M19011 Primary osteoarthritis, right shoulder: Secondary | ICD-10-CM | POA: Diagnosis not present

## 2018-11-18 DIAGNOSIS — Z466 Encounter for fitting and adjustment of urinary device: Secondary | ICD-10-CM | POA: Diagnosis not present

## 2018-11-18 DIAGNOSIS — I10 Essential (primary) hypertension: Secondary | ICD-10-CM | POA: Diagnosis not present

## 2018-11-18 DIAGNOSIS — M19012 Primary osteoarthritis, left shoulder: Secondary | ICD-10-CM | POA: Diagnosis not present

## 2018-11-18 DIAGNOSIS — R69 Illness, unspecified: Secondary | ICD-10-CM | POA: Diagnosis not present

## 2018-11-18 DIAGNOSIS — I252 Old myocardial infarction: Secondary | ICD-10-CM | POA: Diagnosis not present

## 2018-11-18 DIAGNOSIS — I255 Ischemic cardiomyopathy: Secondary | ICD-10-CM | POA: Diagnosis not present

## 2018-11-18 DIAGNOSIS — R32 Unspecified urinary incontinence: Secondary | ICD-10-CM | POA: Diagnosis not present

## 2018-11-18 DIAGNOSIS — E785 Hyperlipidemia, unspecified: Secondary | ICD-10-CM | POA: Diagnosis not present

## 2018-11-18 DIAGNOSIS — I2510 Atherosclerotic heart disease of native coronary artery without angina pectoris: Secondary | ICD-10-CM | POA: Diagnosis not present

## 2018-11-25 DIAGNOSIS — Z466 Encounter for fitting and adjustment of urinary device: Secondary | ICD-10-CM | POA: Diagnosis not present

## 2018-11-25 DIAGNOSIS — I255 Ischemic cardiomyopathy: Secondary | ICD-10-CM | POA: Diagnosis not present

## 2018-11-25 DIAGNOSIS — I10 Essential (primary) hypertension: Secondary | ICD-10-CM | POA: Diagnosis not present

## 2018-11-25 DIAGNOSIS — I252 Old myocardial infarction: Secondary | ICD-10-CM | POA: Diagnosis not present

## 2018-11-25 DIAGNOSIS — R69 Illness, unspecified: Secondary | ICD-10-CM | POA: Diagnosis not present

## 2018-11-25 DIAGNOSIS — M19011 Primary osteoarthritis, right shoulder: Secondary | ICD-10-CM | POA: Diagnosis not present

## 2018-11-25 DIAGNOSIS — M19012 Primary osteoarthritis, left shoulder: Secondary | ICD-10-CM | POA: Diagnosis not present

## 2018-11-25 DIAGNOSIS — E785 Hyperlipidemia, unspecified: Secondary | ICD-10-CM | POA: Diagnosis not present

## 2018-11-25 DIAGNOSIS — I2510 Atherosclerotic heart disease of native coronary artery without angina pectoris: Secondary | ICD-10-CM | POA: Diagnosis not present

## 2018-11-25 DIAGNOSIS — R32 Unspecified urinary incontinence: Secondary | ICD-10-CM | POA: Diagnosis not present

## 2018-12-09 ENCOUNTER — Telehealth: Payer: Self-pay | Admitting: Internal Medicine

## 2018-12-09 DIAGNOSIS — N401 Enlarged prostate with lower urinary tract symptoms: Secondary | ICD-10-CM | POA: Diagnosis not present

## 2018-12-09 DIAGNOSIS — R8279 Other abnormal findings on microbiological examination of urine: Secondary | ICD-10-CM | POA: Diagnosis not present

## 2018-12-09 DIAGNOSIS — R338 Other retention of urine: Secondary | ICD-10-CM | POA: Diagnosis not present

## 2018-12-09 DIAGNOSIS — R310 Gross hematuria: Secondary | ICD-10-CM | POA: Diagnosis not present

## 2018-12-09 NOTE — Telephone Encounter (Signed)
   Primary Cardiologist: Dr. Lovena Le  Chart reviewed as part of pre-operative protocol coverage. Because of [name]'s past medical history and time since last visit, he/she will require a follow-up visit in order to better assess preoperative cardiovascular risk.  Pre-op covering staff: - Please schedule appointment and call patient to inform them. - Please contact requesting surgeon's office via preferred method (i.e, phone, fax) to inform them of need for appointment prior to surgery.  If applicable, this message will also be routed to pharmacy pool and/or primary cardiologist for input on holding anticoagulant/antiplatelet agent as requested below so that this information is available at time of patient's appointment.   [name], [place]  12/09/2018, 2:20 PM

## 2018-12-09 NOTE — Telephone Encounter (Signed)
     [facility] Pre-operative Risk Assessment    Request for surgical clearance:  1. What type of surgery is being performed? Bipolar transurethral resection of prostate   2. When is this surgery scheduled? TBD  3. What type of clearance is required (medical clearance vs. Pharmacy clearance to hold med vs. Both)? both  4. Are there any medications that need to be held prior to surgery and how long? Aspirin held five days prior  5. Practice name and name of physician performing surgery? [facility], Dr Harrell Gave Lovena Neighbours  6. What is your office phone number [phone]   7.   What is your office fax number [phone]  8.   Anesthesia type (None, local, MAC, general) ? General    [name] S 12/09/2018, 12:46 PM  _________________________________________________________________   (provider comments below)

## 2019-01-11 ENCOUNTER — Other Ambulatory Visit: Payer: Self-pay

## 2019-01-11 ENCOUNTER — Ambulatory Visit: Payer: Medicare HMO | Admitting: Internal Medicine

## 2019-01-11 ENCOUNTER — Encounter: Payer: Self-pay | Admitting: Internal Medicine

## 2019-01-11 VITALS — BP 130/72 | HR 72 | Ht 64.0 in | Wt 110.2 lb

## 2019-01-11 DIAGNOSIS — E782 Mixed hyperlipidemia: Secondary | ICD-10-CM

## 2019-01-11 DIAGNOSIS — I2510 Atherosclerotic heart disease of native coronary artery without angina pectoris: Secondary | ICD-10-CM | POA: Insufficient documentation

## 2019-01-11 DIAGNOSIS — I4510 Unspecified right bundle-branch block: Secondary | ICD-10-CM | POA: Insufficient documentation

## 2019-01-11 DIAGNOSIS — I25119 Atherosclerotic heart disease of native coronary artery with unspecified angina pectoris: Secondary | ICD-10-CM

## 2019-01-11 NOTE — Patient Instructions (Addendum)

## 2019-01-11 NOTE — Progress Notes (Signed)
HPI [name] returns today for preoperative evaluation. He is a pleasant 83 yo man with CAD, HTN, who is pending urologic surgery. He has otherwise felt well. He denies chest pain or sob. His weight is down 7 lbs. He walks and has not been in the hospital. No edema. He has a foley catheter. No Known Allergies   Current Outpatient Medications  Medication Sig Dispense Refill  . aspirin EC 81 MG tablet Take 81 mg by mouth daily.    . metoprolol tartrate (LOPRESSOR) 50 MG tablet TAKE 1 TABLET BY MOUTH TWICE DAILY. PLEASE KEEP UPCOMING APPT IN SEPTEMBER BEFORE ANYMORE REFILLS. (Patient taking differently: Take 50 mg by mouth 2 (two) times daily. ) 180 tablet 3  . ramipril (ALTACE) 2.5 MG capsule Take 1 capsule (2.5 mg total) by mouth daily. 90 capsule 3  . simvastatin (ZOCOR) 10 MG tablet TAKE 1 TABLET BY MOUTH AT BEDTIME. PLEASE KEEP UPCOMING APPT IN SEPTEMBER BEFORE ANYMORE REFILLS. (Patient taking differently: Take 10 mg by mouth at bedtime. ) 90 tablet 2  . tamsulosin (FLOMAX) 0.4 MG CAPS capsule Take 1 capsule (0.4 mg total) by mouth daily. 30 capsule 0   No current facility-administered medications for this visit.      Past Medical History:  Diagnosis Date  . Cataract 2020   bilateral cataracts  . Coronary artery disease   . Epididymitis    Left  . GERD (gastroesophageal reflux disease)   . High anion gap metabolic acidosis 123456  . Hyperlipidemia   . Hypertension   . MI (myocardial infarction) ([place])    first at age 41  . Routine general medical examination at a health care facility   . Skin lesions, generalized     ROS:   All systems reviewed and negative except as noted in the HPI.   Past Surgical History:  Procedure Laterality Date  . APPENDECTOMY    . CARDIAC CATHETERIZATION     MI x4 with stents   . CHOLECYSTECTOMY    . HIATAL HERNIA REPAIR    . INGUINAL HERNIA REPAIR    . TONSILLECTOMY       Family History  Problem Relation Age of Onset  .  Asthma Mother      Social History   Socioeconomic History  . Marital status: Married    Spouse name: [name]  . Number of children: 6  . Years of education: 65  . Highest education level: 8th grade  Occupational History  . Occupation: retired Manufacturing systems engineer  . Financial resource strain: Not hard at all  . Food insecurity    Worry: Never true    Inability: Never true  . Transportation needs    Medical: No    Non-medical: No  Tobacco Use  . Smoking status: Former Smoker    Packs/day: 1.00    Years: 30.00    Pack years: 30.00    Quit date: 04/14/1986    Years since quitting: 32.7  . Smokeless tobacco: Never Used  Substance and Sexual Activity  . Alcohol use: No  . Drug use: No  . Sexual activity: Not Currently    Birth control/protection: None  Lifestyle  . Physical activity    Days per week: 0 days    Minutes per session: 0 min  . Stress: Not at all  Relationships  . Social connections    Talks on phone: More than three times a week    Gets together: More than three  times a week    Attends religious service: 1 to 4 times per year    Active member of club or organization: No    Attends meetings of clubs or organizations: Never    Relationship status: Married  . Intimate partner violence    Fear of current or ex partner: No    Emotionally abused: No    Physically abused: No    Forced sexual activity: No  Other Topics Concern  . Not on file  Social History Narrative   Married.   6 children, 12 grandchildren, 12 great grandchildren.   Once worked as a Games developer.   Enjoys relaxing, spending time with family.     BP 130/72   Pulse 72   Ht 5\' 4"  (1.626 m)   Wt 110 lb 3.2 oz (50 kg)   SpO2 96%   BMI 18.92 kg/m   Physical Exam:  Well appearing NAD HEENT: Unremarkable Neck:  No JVD, no thyromegally Lymphatics:  No adenopathy Back:  No CVA tenderness Lungs:  Clear with no wheezes HEART:  Regular rate rhythm, no murmurs, no rubs, no clicks Abd:   soft, positive bowel sounds, no organomegally, no rebound, no guarding Ext:  2 plus pulses, no edema, no cyanosis, no clubbing Skin:  No rashes no nodules Neuro:  CN II through XII intact, motor grossly intact  EKG - nsr with RBBB and PAC's  Assess/Plan: 1. Preoperative eval - the patient is low risk for major cardiac complications from pending urologic surgery. He may proceed. 2. CAD - he is s/p remote MI. He denies anginal symptoms.  3. HTN - his bp is well controlled. No change in meds. 4. Dyslipidemia - he will continue his statin therapy.   [name].D.

## 2019-01-20 ENCOUNTER — Other Ambulatory Visit: Payer: Self-pay | Admitting: Urology

## 2019-02-05 ENCOUNTER — Other Ambulatory Visit: Payer: Self-pay | Admitting: Internal Medicine

## 2019-02-12 ENCOUNTER — Other Ambulatory Visit (HOSPITAL_COMMUNITY)
Admission: RE | Admit: 2019-02-12 | Discharge: 2019-02-12 | Disposition: A | Discharge status: Home / Self Care | Payer: Medicare HMO | Source: Ambulatory Visit | Attending: Urology | Admitting: Urology

## 2019-02-12 DIAGNOSIS — Z01812 Encounter for preprocedural laboratory examination: Secondary | ICD-10-CM | POA: Diagnosis not present

## 2019-02-12 DIAGNOSIS — Z20828 Contact with and (suspected) exposure to other viral communicable diseases: Secondary | ICD-10-CM | POA: Insufficient documentation

## 2019-02-13 LAB — NOVEL CORONAVIRUS, NAA (HOSP ORDER, SEND-OUT TO REF LAB; TAT 18-24 HRS): SARS-CoV-2, NAA: NOT DETECTED

## 2019-02-15 ENCOUNTER — Other Ambulatory Visit: Payer: Self-pay

## 2019-02-15 ENCOUNTER — Encounter (HOSPITAL_BASED_OUTPATIENT_CLINIC_OR_DEPARTMENT_OTHER): Payer: Self-pay | Admitting: *Deleted

## 2019-02-15 NOTE — Progress Notes (Addendum)
ADDENDUM:  Chart reviewed by anesthesia, Konrad Felix PA, ok to proceed.   Spoke w/ via phone for pre-op interview--- Pt wife, [name], pt has memory issues Lab needs dos----   Istat 8            Lab results------ EKG 01-11-2019 chart/ epic COVID test ------  02-12-2019 Arrive at ------- 0630 NPO after ------ Mn Medications to take morning of surgery ----- Lopressor w/ sips of water Diabetic medication ----- n/a Patient Special Instructions ----- reviewed [facility] guidelines and visitor restriction policy,  Asked to bring home medications in prescription bottles Pre-Op special Istructions ----- pt has memory issues, per wife , has good days and bad days.  Wife know meds  Patient wife verbalized understanding of instructions that were given at this phone interview. Patient denies cardiac s&s, shortness of breath, chest pain, fever, cough a this phone interview.   Anesthesia Review: hx two MI's 1996 stent x1 proxRCA and 01/ 2002  S/p  PCI w/ BMSs to  midRCA, distalRCA (nonoverlapping) w/ cutting balloon angioplasty to [facility] / dRCA 07/ 2002.  Per wife pt has not had any cardiac symptoms since 2007 .  PCP:  Alma Friendly NP (lov 11-03-2018 epic) Cardiologist :  Dr Cristopher Peru (lov 01-11-2019 epic)   Chest x-ray :  02-02-2018 epic EKG :  01-11-2019 epic Echo : 02-02-2018  Epic, ef 45-50% Stress test:  no Cardiac Cath :  Last one 03-14-2006 epic Sleep Study/ CPAP : NO  Blood Thinner/ Instructions Maryjane Hurter Dose:  NO ASA / Instructions/ Last Dose :  ASA mg/  Per pt was told to stop 5 days by dr winter office/  Last dose 02-11-2019

## 2019-02-16 ENCOUNTER — Other Ambulatory Visit: Payer: Self-pay

## 2019-02-16 ENCOUNTER — Encounter (HOSPITAL_COMMUNITY)
Admission: RE | Disposition: A | Discharge status: Home / Self Care | Payer: Self-pay | Source: Home / Self Care | Attending: Urology

## 2019-02-16 ENCOUNTER — Inpatient Hospital Stay (HOSPITAL_BASED_OUTPATIENT_CLINIC_OR_DEPARTMENT_OTHER)
Admission: RE | Admit: 2019-02-16 | Discharge: 2019-02-18 | DRG: 714 | Disposition: A | Discharge status: Home / Self Care | Payer: Medicare HMO | Attending: Urology | Admitting: Urology

## 2019-02-16 ENCOUNTER — Ambulatory Visit (HOSPITAL_BASED_OUTPATIENT_CLINIC_OR_DEPARTMENT_OTHER): Payer: Medicare HMO | Admitting: Physician Assistant

## 2019-02-16 ENCOUNTER — Observation Stay (HOSPITAL_COMMUNITY): Payer: Medicare HMO

## 2019-02-16 ENCOUNTER — Encounter (HOSPITAL_BASED_OUTPATIENT_CLINIC_OR_DEPARTMENT_OTHER): Payer: Self-pay

## 2019-02-16 DIAGNOSIS — K219 Gastro-esophageal reflux disease without esophagitis: Secondary | ICD-10-CM | POA: Diagnosis not present

## 2019-02-16 DIAGNOSIS — Z972 Presence of dental prosthetic device (complete) (partial): Secondary | ICD-10-CM

## 2019-02-16 DIAGNOSIS — I10 Essential (primary) hypertension: Secondary | ICD-10-CM | POA: Diagnosis present

## 2019-02-16 DIAGNOSIS — N401 Enlarged prostate with lower urinary tract symptoms: Principal | ICD-10-CM | POA: Diagnosis present

## 2019-02-16 DIAGNOSIS — R5082 Postprocedural fever: Secondary | ICD-10-CM | POA: Diagnosis present

## 2019-02-16 DIAGNOSIS — I2510 Atherosclerotic heart disease of native coronary artery without angina pectoris: Secondary | ICD-10-CM | POA: Diagnosis present

## 2019-02-16 DIAGNOSIS — N3289 Other specified disorders of bladder: Secondary | ICD-10-CM | POA: Diagnosis present

## 2019-02-16 DIAGNOSIS — Z79899 Other long term (current) drug therapy: Secondary | ICD-10-CM

## 2019-02-16 DIAGNOSIS — R338 Other retention of urine: Secondary | ICD-10-CM | POA: Diagnosis not present

## 2019-02-16 DIAGNOSIS — N138 Other obstructive and reflux uropathy: Secondary | ICD-10-CM | POA: Diagnosis present

## 2019-02-16 DIAGNOSIS — I252 Old myocardial infarction: Secondary | ICD-10-CM | POA: Diagnosis not present

## 2019-02-16 DIAGNOSIS — Z7982 Long term (current) use of aspirin: Secondary | ICD-10-CM

## 2019-02-16 DIAGNOSIS — D291 Benign neoplasm of prostate: Secondary | ICD-10-CM | POA: Diagnosis present

## 2019-02-16 DIAGNOSIS — R509 Fever, unspecified: Secondary | ICD-10-CM

## 2019-02-16 DIAGNOSIS — E785 Hyperlipidemia, unspecified: Secondary | ICD-10-CM | POA: Diagnosis present

## 2019-02-16 DIAGNOSIS — Z973 Presence of spectacles and contact lenses: Secondary | ICD-10-CM

## 2019-02-16 DIAGNOSIS — Z955 Presence of coronary angioplasty implant and graft: Secondary | ICD-10-CM

## 2019-02-16 DIAGNOSIS — Z87891 Personal history of nicotine dependence: Secondary | ICD-10-CM

## 2019-02-16 HISTORY — DX: Personal history of other (healed) physical injury and trauma: Z87.828

## 2019-02-16 HISTORY — DX: Benign prostatic hyperplasia without lower urinary tract symptoms: N40.0

## 2019-02-16 HISTORY — DX: Unspecified right bundle-branch block: I45.10

## 2019-02-16 HISTORY — DX: Complete loss of teeth, unspecified cause, unspecified class: K08.109

## 2019-02-16 HISTORY — DX: Retention of urine, unspecified: R33.9

## 2019-02-16 HISTORY — DX: Personal history of other diseases of the digestive system: Z87.19

## 2019-02-16 HISTORY — PX: TRANSURETHRAL RESECTION OF PROSTATE: SHX73

## 2019-02-16 HISTORY — DX: Presence of other specified devices: Z97.8

## 2019-02-16 HISTORY — DX: Atrial premature depolarization: I49.1

## 2019-02-16 HISTORY — DX: Other amnesia: R41.3

## 2019-02-16 HISTORY — DX: Hyperlipidemia, unspecified: E78.5

## 2019-02-16 HISTORY — DX: Presence of spectacles and contact lenses: Z97.3

## 2019-02-16 HISTORY — DX: Hydrocele, unspecified: N43.3

## 2019-02-16 LAB — POCT I-STAT, CHEM 8
BUN: 13 mg/dL (ref 8–23)
Calcium, Ion: 1.27 mmol/L (ref 1.15–1.40)
Chloride: 100 mmol/L (ref 98–111)
Creatinine, Ser: 0.8 mg/dL (ref 0.61–1.24)
Glucose, Bld: 88 mg/dL (ref 70–99)
HCT: 49 % (ref 39.0–52.0)
Hemoglobin: 16.7 g/dL (ref 13.0–17.0)
Potassium: 3.7 mmol/L (ref 3.5–5.1)
Sodium: 143 mmol/L (ref 135–145)
TCO2: 28 mmol/L (ref 22–32)

## 2019-02-16 SURGERY — TURP (TRANSURETHRAL RESECTION OF PROSTATE)
Anesthesia: General | Site: Prostate

## 2019-02-16 MED ORDER — PHENYLEPHRINE 40 MCG/ML (10ML) SYRINGE FOR IV PUSH (FOR BLOOD PRESSURE SUPPORT)
PREFILLED_SYRINGE | INTRAVENOUS | Status: DC | PRN
  Administered 2019-02-16: 40 ug via INTRAVENOUS
  Administered 2019-02-16 (×3): 80 ug via INTRAVENOUS

## 2019-02-16 MED ORDER — LIDOCAINE 2% (20 MG/ML) 5 ML SYRINGE
INTRAMUSCULAR | Status: DC | PRN
  Administered 2019-02-16: 60 mg via INTRAVENOUS
  Administered 2019-02-16: 40 mg via INTRAVENOUS

## 2019-02-16 MED ORDER — PHENYLEPHRINE 40 MCG/ML (10ML) SYRINGE FOR IV PUSH (FOR BLOOD PRESSURE SUPPORT)
PREFILLED_SYRINGE | INTRAVENOUS | Status: AC
  Filled 2019-02-16: qty 30

## 2019-02-16 MED ORDER — ONDANSETRON HCL 4 MG/2ML IJ SOLN
INTRAMUSCULAR | Status: AC
  Filled 2019-02-16: qty 4

## 2019-02-16 MED ORDER — MEPERIDINE HCL 25 MG/ML IJ SOLN
6.2500 mg | INTRAMUSCULAR | Status: DC | PRN
  Filled 2019-02-16: qty 1

## 2019-02-16 MED ORDER — LIDOCAINE 2% (20 MG/ML) 5 ML SYRINGE
INTRAMUSCULAR | Status: AC
  Filled 2019-02-16: qty 20

## 2019-02-16 MED ORDER — PROPOFOL 10 MG/ML IV BOLUS
INTRAVENOUS | Status: AC
  Filled 2019-02-16: qty 20

## 2019-02-16 MED ORDER — METOPROLOL TARTRATE 50 MG PO TABS
50.0000 mg | ORAL_TABLET | Freq: Two times a day (BID) | ORAL | Status: DC
  Administered 2019-02-16 – 2019-02-18 (×4): 50 mg via ORAL
  Filled 2019-02-16 (×4): qty 1

## 2019-02-16 MED ORDER — TAMSULOSIN HCL 0.4 MG PO CAPS
0.4000 mg | ORAL_CAPSULE | Freq: Every day | ORAL | Status: DC
  Administered 2019-02-16 – 2019-02-18 (×3): 0.4 mg via ORAL
  Filled 2019-02-16 (×3): qty 1

## 2019-02-16 MED ORDER — FENTANYL CITRATE (PF) 100 MCG/2ML IJ SOLN
INTRAMUSCULAR | Status: AC
  Filled 2019-02-16: qty 2

## 2019-02-16 MED ORDER — DEXAMETHASONE SODIUM PHOSPHATE 10 MG/ML IJ SOLN
INTRAMUSCULAR | Status: AC
  Filled 2019-02-16: qty 2

## 2019-02-16 MED ORDER — ACETAMINOPHEN 325 MG PO TABS
ORAL_TABLET | ORAL | Status: AC
  Filled 2019-02-16: qty 2

## 2019-02-16 MED ORDER — CIPROFLOXACIN IN D5W 400 MG/200ML IV SOLN
400.0000 mg | Freq: Once | INTRAVENOUS | Status: AC
  Administered 2019-02-16: 400 mg via INTRAVENOUS
  Filled 2019-02-16: qty 200

## 2019-02-16 MED ORDER — SODIUM CHLORIDE 0.9 % IR SOLN
3000.0000 mL | Status: DC
  Administered 2019-02-16 – 2019-02-17 (×4): 3000 mL
  Filled 2019-02-16: qty 3000

## 2019-02-16 MED ORDER — BELLADONNA ALKALOIDS-OPIUM 16.2-60 MG RE SUPP
1.0000 | Freq: Four times a day (QID) | RECTAL | Status: DC | PRN
  Administered 2019-02-17: 1 via RECTAL
  Filled 2019-02-16 (×2): qty 1

## 2019-02-16 MED ORDER — DIPHENHYDRAMINE HCL 50 MG/ML IJ SOLN
12.5000 mg | Freq: Four times a day (QID) | INTRAMUSCULAR | Status: DC | PRN
  Filled 2019-02-16: qty 0.25

## 2019-02-16 MED ORDER — SIMVASTATIN 20 MG PO TABS
10.0000 mg | ORAL_TABLET | Freq: Every day | ORAL | Status: DC
  Administered 2019-02-16 – 2019-02-17 (×2): 10 mg via ORAL
  Filled 2019-02-16 (×3): qty 1

## 2019-02-16 MED ORDER — RAMIPRIL 2.5 MG PO CAPS
2.5000 mg | ORAL_CAPSULE | Freq: Every day | ORAL | Status: DC
  Administered 2019-02-16 – 2019-02-18 (×2): 2.5 mg via ORAL
  Filled 2019-02-16 (×3): qty 1

## 2019-02-16 MED ORDER — CIPROFLOXACIN IN D5W 400 MG/200ML IV SOLN
INTRAVENOUS | Status: AC
  Filled 2019-02-16: qty 200

## 2019-02-16 MED ORDER — BELLADONNA ALKALOIDS-OPIUM 16.2-60 MG RE SUPP
RECTAL | Status: DC | PRN
  Administered 2019-02-16: 1 via RECTAL

## 2019-02-16 MED ORDER — DOCUSATE SODIUM 100 MG PO CAPS
100.0000 mg | ORAL_CAPSULE | Freq: Two times a day (BID) | ORAL | Status: DC
  Administered 2019-02-16 – 2019-02-18 (×4): 100 mg via ORAL
  Filled 2019-02-16 (×4): qty 1

## 2019-02-16 MED ORDER — OXYBUTYNIN CHLORIDE 5 MG PO TABS
5.0000 mg | ORAL_TABLET | Freq: Three times a day (TID) | ORAL | Status: DC | PRN
  Filled 2019-02-16: qty 1

## 2019-02-16 MED ORDER — ONDANSETRON HCL 4 MG/2ML IJ SOLN
4.0000 mg | INTRAMUSCULAR | Status: DC | PRN
  Filled 2019-02-16: qty 2

## 2019-02-16 MED ORDER — DIPHENHYDRAMINE HCL 12.5 MG/5ML PO ELIX
12.5000 mg | ORAL_SOLUTION | Freq: Four times a day (QID) | ORAL | Status: DC | PRN
  Filled 2019-02-16: qty 5

## 2019-02-16 MED ORDER — HYDROMORPHONE HCL 1 MG/ML IJ SOLN
INTRAMUSCULAR | Status: AC
  Filled 2019-02-16: qty 1

## 2019-02-16 MED ORDER — ROCURONIUM BROMIDE 10 MG/ML (PF) SYRINGE
PREFILLED_SYRINGE | INTRAVENOUS | Status: AC
  Filled 2019-02-16: qty 10

## 2019-02-16 MED ORDER — ACETAMINOPHEN 325 MG PO TABS
650.0000 mg | ORAL_TABLET | ORAL | Status: DC | PRN
  Administered 2019-02-16 – 2019-02-17 (×3): 650 mg via ORAL
  Filled 2019-02-16: qty 2

## 2019-02-16 MED ORDER — SODIUM CHLORIDE 0.9 % IR SOLN
Status: DC | PRN
  Administered 2019-02-16: 21000 mL via INTRAVESICAL

## 2019-02-16 MED ORDER — LACTATED RINGERS IV SOLN
INTRAVENOUS | Status: DC
  Administered 2019-02-16: 08:00:00 via INTRAVENOUS
  Filled 2019-02-16: qty 1000

## 2019-02-16 MED ORDER — PHENYLEPHRINE 40 MCG/ML (10ML) SYRINGE FOR IV PUSH (FOR BLOOD PRESSURE SUPPORT)
PREFILLED_SYRINGE | INTRAVENOUS | Status: AC
  Filled 2019-02-16: qty 10

## 2019-02-16 MED ORDER — BELLADONNA ALKALOIDS-OPIUM 16.2-60 MG RE SUPP
RECTAL | Status: AC
  Filled 2019-02-16: qty 1

## 2019-02-16 MED ORDER — FENTANYL CITRATE (PF) 100 MCG/2ML IJ SOLN
INTRAMUSCULAR | Status: DC | PRN
  Administered 2019-02-16: 25 ug via INTRAVENOUS
  Administered 2019-02-16: 50 ug via INTRAVENOUS
  Administered 2019-02-16 (×3): 25 ug via INTRAVENOUS

## 2019-02-16 MED ORDER — HYDROCODONE-ACETAMINOPHEN 5-325 MG PO TABS
1.0000 | ORAL_TABLET | ORAL | Status: DC | PRN
  Filled 2019-02-16: qty 2

## 2019-02-16 MED ORDER — SUCCINYLCHOLINE CHLORIDE 200 MG/10ML IV SOSY
PREFILLED_SYRINGE | INTRAVENOUS | Status: AC
  Filled 2019-02-16: qty 10

## 2019-02-16 MED ORDER — HYDROMORPHONE HCL 1 MG/ML IJ SOLN
0.2500 mg | INTRAMUSCULAR | Status: DC | PRN
  Administered 2019-02-16: 0.5 mg via INTRAVENOUS
  Filled 2019-02-16: qty 0.5

## 2019-02-16 MED ORDER — ONDANSETRON HCL 4 MG/2ML IJ SOLN
INTRAMUSCULAR | Status: DC | PRN
  Administered 2019-02-16: 4 mg via INTRAVENOUS

## 2019-02-16 MED ORDER — CIPROFLOXACIN IN D5W 400 MG/200ML IV SOLN
400.0000 mg | Freq: Two times a day (BID) | INTRAVENOUS | Status: DC
  Administered 2019-02-16 – 2019-02-18 (×3): 400 mg via INTRAVENOUS
  Filled 2019-02-16 (×4): qty 200

## 2019-02-16 MED ORDER — ONDANSETRON HCL 4 MG/2ML IJ SOLN
4.0000 mg | Freq: Once | INTRAMUSCULAR | Status: DC | PRN
  Filled 2019-02-16: qty 2

## 2019-02-16 MED ORDER — PROPOFOL 10 MG/ML IV BOLUS
INTRAVENOUS | Status: DC | PRN
  Administered 2019-02-16 (×2): 100 mg via INTRAVENOUS

## 2019-02-16 MED ORDER — SODIUM CHLORIDE 0.9 % IV SOLN
INTRAVENOUS | Status: DC
  Administered 2019-02-16 – 2019-02-17 (×2): via INTRAVENOUS
  Filled 2019-02-16 (×3): qty 1000

## 2019-02-16 MED ORDER — EPHEDRINE 5 MG/ML INJ
INTRAVENOUS | Status: AC
  Filled 2019-02-16: qty 10

## 2019-02-16 MED ORDER — MORPHINE SULFATE (PF) 2 MG/ML IV SOLN
2.0000 mg | INTRAVENOUS | Status: DC | PRN
  Filled 2019-02-16: qty 2

## 2019-02-16 SURGICAL SUPPLY — 19 items
BAG DRAIN URO-CYSTO SKYTR STRL (DRAIN) ×2 IMPLANT
BAG DRN RND TRDRP ANRFLXCHMBR (UROLOGICAL SUPPLIES) ×1
BAG DRN UROCATH (DRAIN) ×1
BAG URINE DRAIN 2000ML AR STRL (UROLOGICAL SUPPLIES) ×2 IMPLANT
CATH FOLEY 3WAY 30CC 24FR (CATHETERS) ×2
CATH URTH STD 24FR FL 3W 2 (CATHETERS) IMPLANT
GLOVE BIO SURGEON STRL SZ7.5 (GLOVE) ×2 IMPLANT
GOWN STRL REUS W/TWL XL LVL3 (GOWN DISPOSABLE) ×2 IMPLANT
HOLDER FOLEY CATH W/STRAP (MISCELLANEOUS) ×1 IMPLANT
IV NS IRRIG 3000ML ARTHROMATIC (IV SOLUTION) ×5 IMPLANT
LOOP CUT BIPOLAR 24F LRG (ELECTROSURGICAL) ×1 IMPLANT
MANIFOLD NEPTUNE II (INSTRUMENTS) ×2 IMPLANT
PACK CYSTO (CUSTOM PROCEDURE TRAY) ×2 IMPLANT
PIN SAFETY STERILE (MISCELLANEOUS) ×2 IMPLANT
RUBBERBAND STERILE (MISCELLANEOUS) ×1 IMPLANT
SYR 30ML LL (SYRINGE) ×1 IMPLANT
SYRINGE IRR TOOMEY STRL 70CC (SYRINGE) ×2 IMPLANT
TUBE CONNECTING 12X1/4 (SUCTIONS) ×2 IMPLANT
TUBING UROLOGY SET (TUBING) ×2 IMPLANT

## 2019-02-16 NOTE — Anesthesia Procedure Notes (Signed)
Date/Time: 02/16/2019 9:28 AM Performed by: Cynda Familia, CRNA Oxygen Delivery Method: Simple face mask Placement Confirmation: positive ETCO2 and breath sounds checked- equal and bilateral Dental Injury: Teeth and Oropharynx as per pre-operative assessment

## 2019-02-16 NOTE — H&P (Signed)
Urology Preoperative H&P   Chief Complaint: Urinary retention  History of Present Illness: [name] is a 83 y.o. male with a history of BPH.  He went into urinary retention in July of 2020 and was unable to pass subsequent voiding trials despite taking tamsulosin BID.  His bladder has been managed with an indwelling Foley. Cystoscopy on 12/09/18 revealed bilobar prostatic urethral obstruction with mild bladder trabeculation.  He denies interval UTIs, prolonged episodes of hematuria or pain due to his catheter.   Past Medical History:  Diagnosis Date  . BPH (benign prostatic hyperplasia)   . Coronary artery disease cardiologist--- dr Carleene Overlie taylor--- hx two MI's and total 3 BMSs   dx MI 1996 w/ cardiac cath stent proxRCA;   NSTEMI  04-23-2000  cardiac cath w/ PCI and stenting to distalRCA and mid RCA;   10-27-2000 cardiac cath w/ PCI cutting balloon angioplasty for in-stent restenosis dRCA and mRCA (non-overlapping stents);  03-14-2006  cardiac cath w/ no intervention , nonsobstructive   . Dyslipidemia   . Foley catheter in place    02-15-2019  per pt wife going on two weeks no urine goes in bag , so pt's sits on toliet and urinates around catheter,  states pt is not complainging of abd. pain or no emptying bladder  . Full dentures   . History of GI bleed    07/ 2002   per discharge note , EGD ulceration/ NG tube injury,  blood transfusion  . History of kidney injury    10-26-2018 due to obstruction--- resolved   . Hydrocele, bilateral   . Hypertension   . Memory impairment   . PAC (premature atrial contraction)   . RBBB (right bundle branch block)   . Skin lesions, generalized   . Urinary retention   . Wears glasses     Past Surgical History:  Procedure Laterality Date  . APPENDECTOMY  child  . CARDIAC CATHETERIZATION  03-14-2006   dr g. taylor   nonobtructive cad 70% mLAD, 50% dLAD, 70% ostial D1, irregularities ti CFx,  20% narrowing in mid & distal RCA stent 40% very  distal RCA narrowing,  inferoapical wall akinesis w/ ef 55%  . CHOLECYSTECTOMY OPEN  1970s  . CORONARY ANGIOPLASTY  10-28-2018   dr g. taylor   cutting balloon angioplasty to in-stent restenosis of dRCA and mRCA  . CORONARY ANGIOPLASTY WITH STENT PLACEMENT  1996   PTCA w/ stenting to proximal RCA  . CORONARY ANGIOPLASTY WITH STENT PLACEMENT  04-23-2000   dr g. taylor   PCI w/ stenting to distal RCA and mid RCA  (non-overlapping)  . INGUINAL HERNIA REPAIR  1980s   unilateral, unsure which side  . TONSILLECTOMY  child    Allergies: No Known Allergies  Family History  Problem Relation Age of Onset  . Asthma Mother     Social History:  reports that he quit smoking about 25 years ago. He has a 30.00 pack-year smoking history. He has never used smokeless tobacco. He reports that he does not drink alcohol or use drugs.  ROS: A complete review of systems was performed.  All systems are negative except for pertinent findings as noted.  Physical Exam:  Vital signs in last 24 hours: Weight:  [51.7 kg] 51.7 kg (11/03 1241) Constitutional:  Alert and oriented, No acute distress Cardiovascular: Regular rate and rhythm, No JVD Respiratory: Normal respiratory effort, Lungs clear bilaterally GI: Abdomen is soft, nontender, nondistended, no abdominal masses GU: No CVA tenderness Lymphatic:  No lymphadenopathy Neurologic: Grossly intact, no focal deficits Psychiatric: Normal mood and affect  Laboratory Data:  No results for input(s): WBC, HGB, HCT, PLT in the last 72 hours.  No results for input(s): NA, K, CL, GLUCOSE, BUN, CALCIUM, CREATININE in the last 72 hours.  Invalid input(s): CO3   No results found for this or any previous visit (from the past 24 hour(s)). Recent Results (from the past 240 hour(s))  Novel Coronavirus, NAA (Hosp order, Send-out to Ref Lab; TAT 18-24 hrs     Status: None   Collection Time: 02/12/19  6:19 PM   Specimen: Nasopharyngeal Swab; Respiratory  Result  Value Ref Range Status   SARS-CoV-2, NAA NOT DETECTED NOT DETECTED Final    Comment: (NOTE) This nucleic acid amplification test was developed and its performance characteristics determined by [employer]. Nucleic acid amplification tests include PCR and TMA. This test has not been FDA cleared or approved. This test has been authorized by FDA under an Emergency Use Authorization (EUA). This test is only authorized for the duration of time the declaration that circumstances exist justifying the authorization of the emergency use of in vitro diagnostic tests for detection of SARS-CoV-2 virus and/or diagnosis of COVID-19 infection under section 564(b)(1) of the Act, 21 U.S.C. PT:2852782) (1), unless the authorization is terminated or revoked sooner. When diagnostic testing is negative, the possibility of a false negative result should be considered in the context of a patient's recent exposures and the presence of clinical signs and symptoms consistent with COVID-19. An individual without symptoms of COVID- 19 and who is not shedding SARS-CoV-2 vi rus would expect to have a negative (not detected) result in this assay. Performed At: [facility] [address] [place], [place] [id number] Rush Farmer MD [id number]    [place]  Final    Comment: Performed at [facility], [place] [address] [place], [place] [address]    Renal Function: No results for input(s): CREATININE in the last 168 hours. CrCl cannot be calculated (Patient's most recent lab result is older than the maximum 21 days allowed.).  Radiologic Imaging: No results found.  I independently reviewed the above imaging studies.  Assessment and Plan [name] is a 83 y.o. male with BPH and urinary retention   -The risks, benefits and alternatives of cystoscopy with TURP was discussed with the patient. The risks included, but are not limited to, bleeding, urinary  tract infection, bladder perforation requiring prolonged catheterization and/or open bladder repair, ureteral injury, ureteral obstruction, urethral stricture disease, new or worsening voiding dysfunction, retrograde ejaculation, MI, CVA, PE, DVT and the inherent risks of general anesthesia. We also discussed the need for Foley catheterization for at least 3 days post-op and the likely need for post-op observation in the hospital following the procedure. The patient voices understanding and wishes to proceed.   Ellison Hughs, MD 02/16/2019, [facility] Pager: [phone]

## 2019-02-16 NOTE — Op Note (Signed)
Operative Note  Preoperative diagnosis:  1.  BPH w/ LUTS and urinary retention   Postoperative diagnosis: 1.  BPH w/ LUTS and urinary retention   Procedure(s): 1.  Bipolar TURP  Surgeon: Ellison Hughs, MD  Assistants:  None  Anesthesia:  General  Complications:  None  EBL:  50 mL   Specimens: 1.  Prostate chips  Drains/Catheters: 1.  24 French 3-way Foley with 30 mL in the balloon  Intraoperative findings:   1. Prominent right prostatic lobe with large intravesical component 2. Moderate bladder trabeculation with several wide-mouthed diverticulum  Indication:  [name] is a 83 y.o. male with a history of BPH and on-going urinary retention  Description of procedure:  After informed consent was obtained, the patient was brought to the operating room and general anesthesia was administered. The patient was then placed in the dorsolithotomy position and prepped and draped in usual sterile fashion. A timeout was performed. A 23 French rigid cystoscope was then inserted into the urethral meatus and advanced into the bladder under direct vision. A complete bladder survey revealed no intravesical pathology.  Both ureteral orifices were identified and well away from the bladder neck.  The rigid cystoscope was then exchanged for a 26 French resectoscope with a bipolar loop working element.  Starting at the bladder neck and progressing distally to the verumontanum, the prostatic adenoma was systematically resected until a widely patent prostatic urethral channel was created.  All prostate chips were then hand irrigated out of the bladder and sent to pathology for permanent section.  The resectoscope was then removed and exchanged for a 24 [place] three-way Foley catheter.  The three-way Foley catheter was then extensively hand irrigated until the irrigant returned clear to light pink.  The catheter was then placed to continuous bladder irrigation and placed on rubber band  traction.  He tolerated the procedure well and was transferred to the postanesthesia unit in stable condition.  Plan:  CBI overnight

## 2019-02-16 NOTE — Progress Notes (Signed)
Patient with a fever of 102.2 at time of vitals and oxygen level of 87% on room air. Dr Jeffie Pollock notified of findings and new orders received for blood cultures, CBC, urine culture, and chest x-ray. Patient given 650mg  of oral tylenol and placed on 2L of oxygen. Saturations up to 96% on O2. Orders implemented. Will continue to closely monitor patient.

## 2019-02-16 NOTE — Anesthesia Procedure Notes (Signed)
Procedure Name: LMA Insertion Date/Time: 02/16/2019 8:29 AM Performed by: Cynda Familia, CRNA Pre-anesthesia Checklist: Patient identified, Emergency Drugs available, Suction available and Patient being monitored Patient Re-evaluated:Patient Re-evaluated prior to induction Oxygen Delivery Method: Circle System Utilized Preoxygenation: Pre-oxygenation with 100% oxygen Induction Type: IV induction Ventilation: Mask ventilation without difficulty LMA: LMA inserted LMA Size: 4.0 Number of attempts: 1 Placement Confirmation: positive ETCO2 Tube secured with: Tape Dental Injury: Teeth and Oropharynx as per pre-operative assessment  Comments: Smooth IV induction [place]-- LMA AM  CRNA atraumatic-- teeth and  Mouth as preop-- bilat BS [place]

## 2019-02-16 NOTE — Anesthesia Preprocedure Evaluation (Signed)
Anesthesia Evaluation  Patient identified by MRN, date of birth, ID band Patient awake    Reviewed: Allergy & Precautions, NPO status , Patient's Chart, lab work & pertinent test results  Airway Mallampati: I  TM Distance: >3 FB Neck ROM: Full    Dental   Pulmonary former smoker,    Pulmonary exam normal        Cardiovascular hypertension, Pt. on medications + CAD, + Past MI and + Cardiac Stents  Normal cardiovascular exam     Neuro/Psych    GI/Hepatic GERD  Medicated and Controlled,  Endo/Other    Renal/GU      Musculoskeletal   Abdominal   Peds  Hematology   Anesthesia Other Findings   Reproductive/Obstetrics                             Anesthesia Physical Anesthesia Plan  ASA: III  Anesthesia Plan: General   Post-op Pain Management:    Induction: Intravenous  PONV Risk Score and Plan: 2  Airway Management Planned: LMA  Additional Equipment:   Intra-op Plan:   Post-operative Plan: Extubation in OR  Informed Consent: I have reviewed the patients History and Physical, chart, labs and discussed the procedure including the risks, benefits and alternatives for the proposed anesthesia with the patient or authorized representative who has indicated his/her understanding and acceptance.       Plan Discussed with: CRNA and Surgeon  Anesthesia Plan Comments:         Anesthesia Quick Evaluation

## 2019-02-16 NOTE — Progress Notes (Signed)
Dr. Lillia Abed informed that patient did not take lopressor this am, BP 136/63 HR 68, and patient had little less than 1/2 cup of black coffee at 0500 this am.  Okay per Dr. Conrad Hawthorn [place].

## 2019-02-16 NOTE — Transfer of Care (Signed)
Immediate Anesthesia Transfer of Care Note  Patient: [name]  Procedure(s) Performed: TRANSURETHRAL RESECTION OF THE PROSTATE (TURP), BIPOLAR (N/A Prostate)  Patient Location: PACU  Anesthesia Type:General  Level of Consciousness: sedated  Airway & Oxygen Therapy: Patient Spontanous Breathing and Patient connected to face mask oxygen  Post-op Assessment: Report given to RN and Post -op Vital signs reviewed and stable  Post vital signs: Reviewed  Last Vitals:  Vitals Value Taken Time  BP 122/70 02/16/19 0935  Temp    Pulse 76 02/16/19 0938  Resp 12 02/16/19 0938  SpO2 96 % 02/16/19 0938  Vitals shown include unvalidated device data.  Last Pain:  Vitals:   02/16/19 0725  TempSrc:   PainSc: 0-No pain      Patients Stated Pain Goal: 4(moderate tolerance per patient) ([id number])  Complications: No apparent anesthesia complications

## 2019-02-17 ENCOUNTER — Encounter (HOSPITAL_BASED_OUTPATIENT_CLINIC_OR_DEPARTMENT_OTHER): Payer: Self-pay | Admitting: Urology

## 2019-02-17 DIAGNOSIS — I2510 Atherosclerotic heart disease of native coronary artery without angina pectoris: Secondary | ICD-10-CM | POA: Diagnosis not present

## 2019-02-17 DIAGNOSIS — E785 Hyperlipidemia, unspecified: Secondary | ICD-10-CM | POA: Diagnosis not present

## 2019-02-17 DIAGNOSIS — Z955 Presence of coronary angioplasty implant and graft: Secondary | ICD-10-CM | POA: Diagnosis not present

## 2019-02-17 DIAGNOSIS — Z79899 Other long term (current) drug therapy: Secondary | ICD-10-CM | POA: Diagnosis not present

## 2019-02-17 DIAGNOSIS — K219 Gastro-esophageal reflux disease without esophagitis: Secondary | ICD-10-CM | POA: Diagnosis not present

## 2019-02-17 DIAGNOSIS — N3289 Other specified disorders of bladder: Secondary | ICD-10-CM | POA: Diagnosis not present

## 2019-02-17 DIAGNOSIS — R5082 Postprocedural fever: Secondary | ICD-10-CM | POA: Diagnosis not present

## 2019-02-17 DIAGNOSIS — N401 Enlarged prostate with lower urinary tract symptoms: Secondary | ICD-10-CM | POA: Diagnosis not present

## 2019-02-17 DIAGNOSIS — R509 Fever, unspecified: Secondary | ICD-10-CM | POA: Diagnosis not present

## 2019-02-17 DIAGNOSIS — Z972 Presence of dental prosthetic device (complete) (partial): Secondary | ICD-10-CM | POA: Diagnosis not present

## 2019-02-17 DIAGNOSIS — Z87891 Personal history of nicotine dependence: Secondary | ICD-10-CM | POA: Diagnosis not present

## 2019-02-17 DIAGNOSIS — D291 Benign neoplasm of prostate: Secondary | ICD-10-CM | POA: Diagnosis not present

## 2019-02-17 DIAGNOSIS — I252 Old myocardial infarction: Secondary | ICD-10-CM | POA: Diagnosis not present

## 2019-02-17 DIAGNOSIS — Z973 Presence of spectacles and contact lenses: Secondary | ICD-10-CM | POA: Diagnosis not present

## 2019-02-17 DIAGNOSIS — R338 Other retention of urine: Secondary | ICD-10-CM | POA: Diagnosis not present

## 2019-02-17 DIAGNOSIS — I10 Essential (primary) hypertension: Secondary | ICD-10-CM | POA: Diagnosis not present

## 2019-02-17 DIAGNOSIS — Z7982 Long term (current) use of aspirin: Secondary | ICD-10-CM | POA: Diagnosis not present

## 2019-02-17 LAB — URINE CULTURE
Culture: <10000 — AB
Special Requests: NORMAL

## 2019-02-17 LAB — BASIC METABOLIC PANEL
Anion gap: 10 (ref 5–15)
BUN: 14 mg/dL (ref 8–23)
CO2: 25 mmol/L (ref 22–32)
Calcium: 8.4 mg/dL — ABNORMAL LOW (ref 8.9–10.3)
Chloride: 101 mmol/L (ref 98–111)
Creatinine, Ser: 1.04 mg/dL (ref 0.61–1.24)
GFR calc Af Amer: >60 mL/min (ref >60)
GFR calc non Af Amer: >60 mL/min (ref >60)
Glucose, Bld: 86 mg/dL (ref 70–99)
Potassium: 4.4 mmol/L (ref 3.5–5.1)
Sodium: 136 mmol/L (ref 135–145)

## 2019-02-17 LAB — CBC
HCT: 39.3 % (ref 39.0–52.0)
HCT: 38.6 % — ABNORMAL LOW (ref 39.0–52.0)
Hemoglobin: 12.1 g/dL — ABNORMAL LOW (ref 13.0–17.0)
Hemoglobin: 12 g/dL — ABNORMAL LOW (ref 13.0–17.0)
MCH: 27.9 pg (ref 26.0–34.0)
MCH: 28.4 pg (ref 26.0–34.0)
MCHC: 30.8 g/dL (ref 30.0–36.0)
MCHC: 31.1 g/dL (ref 30.0–36.0)
MCV: 90.8 fL (ref 80.0–100.0)
MCV: 91.5 fL (ref 80.0–100.0)
Platelets: 137 10*3/uL — ABNORMAL LOW (ref 150–400)
Platelets: 133 10*3/uL — ABNORMAL LOW (ref 150–400)
RBC: 4.33 MIL/uL (ref 4.22–5.81)
RBC: 4.22 MIL/uL (ref 4.22–5.81)
RDW: 12.7 % (ref 11.5–15.5)
RDW: 13 % (ref 11.5–15.5)
WBC: 10.2 10*3/uL (ref 4.0–10.5)
WBC: 7.7 10*3/uL (ref 4.0–10.5)
nRBC: 0 % (ref 0.0–0.2)
nRBC: 0 % (ref 0.0–0.2)

## 2019-02-17 LAB — SURGICAL PATHOLOGY

## 2019-02-17 MED ORDER — BELLADONNA ALKALOIDS-OPIUM 16.2-30 MG RE SUPP
RECTAL | Status: AC
  Filled 2019-02-17: qty 1

## 2019-02-17 MED ORDER — CIPROFLOXACIN IN D5W 400 MG/200ML IV SOLN
400.0000 mg | Freq: Once | INTRAVENOUS | Status: AC
  Administered 2019-02-17: 400 mg via INTRAVENOUS
  Filled 2019-02-17: qty 200

## 2019-02-17 MED ORDER — PROPOFOL 500 MG/50ML IV EMUL
INTRAVENOUS | Status: AC
  Filled 2019-02-17: qty 50

## 2019-02-17 MED ORDER — DOCUSATE SODIUM 100 MG PO CAPS
ORAL_CAPSULE | ORAL | Status: AC
  Filled 2019-02-17: qty 1

## 2019-02-17 MED ORDER — SODIUM CHLORIDE 0.9 % IV SOLN
1.0000 g | Freq: Two times a day (BID) | INTRAVENOUS | Status: DC
  Administered 2019-02-17 – 2019-02-18 (×2): 1 g via INTRAVENOUS
  Filled 2019-02-17 (×6): qty 1

## 2019-02-17 MED ORDER — CIPROFLOXACIN IN D5W 400 MG/200ML IV SOLN
INTRAVENOUS | Status: AC
  Filled 2019-02-17: qty 200

## 2019-02-17 MED ORDER — ACETAMINOPHEN 325 MG PO TABS
ORAL_TABLET | ORAL | Status: AC
  Filled 2019-02-17: qty 2

## 2019-02-17 MED ORDER — CHLORHEXIDINE GLUCONATE CLOTH 2 % EX PADS
6.0000 | MEDICATED_PAD | Freq: Every day | CUTANEOUS | Status: DC
  Administered 2019-02-17 – 2019-02-18 (×2): 6 via TOPICAL

## 2019-02-17 NOTE — Progress Notes (Signed)
I( was called in the niight for a fever of 102.   The patient was reported to have no specific complaints.  His O2 sat on RA was 87 but came up to 96 on 2l [place] O2.   He remains on Cipro and his Temp declined with tylenol but is back up to 101.1 this AM with nl VS.    CBC demonstrated a normal WBC.  CXR demonstrated possible atelectesis  Blood and Urine cultures obtained.   If spoken to his nurse again this morning and will defer antibiotic changes and admission orders to Dr. Lovena Neighbours who will be rounding this morning.

## 2019-02-17 NOTE — Progress Notes (Signed)
Pts BP in 90's/40's HR in the 70's, asymptomatic with low BP. Denies pain, temp WNL O2 at 2L sats from 95-100%, diminished lung sounds in left lower lobe, Incentive spirometry encouraged, results 1250, pt states he was able to do better throughout the night. Encouraged continued use. Pt refusing to drink at present, IVF at 42. Dr. Lovena Neighbours made aware, NS bolus ordered, Will continue to monitor and manage until pt transferred inpatient.

## 2019-02-17 NOTE — Progress Notes (Signed)
1 Day Post-Op Subjective: Fever of 102 overnight that has since improved to 99.61F after a dose of tylenol.  He states that he feels fine and want to go home.  Urine is clear.  CXR is clear, but is requiring supplemental oxygen via [place].   Objective: Vital signs in last 24 hours: Temp:  [97.4 F (36.3 C)-102.2 F (39 C)] 99.8 F (37.7 C) (11/05 0721) Pulse Rate:  [61-111] 90 (11/05 0610) Resp:  [9-24] 24 (11/05 0610) BP: (95-134)/(42-83) 123/56 (11/05 0610) SpO2:  [87 %-100 %] 96 % (11/05 0610)  Intake/Output from previous day: 11/04 0701 - 11/05 0700 In: [place] [P.O.:170; I.V.:2370; IV Piggyback:200] Out: [id number] [Urine:13200; Blood:50]  Intake/Output this shift: Total I/O In: 1400 [Other:1200; IV Piggyback:200] Out: 1000 [Urine:1000]  Physical Exam:  General: Alert and oriented CV: RRR, palpable distal pulses Lungs: CTAB, equal chest rise Abdomen: Soft, NTND, no rebound or guarding Gu: 24 F 3-way in place and draining clear urine--off of CBI and traction Ext: NT, No erythema  Lab Results: Recent Labs    02/16/19 0746 02/16/19 2318 02/17/19 0807  HGB 16.7 12.1* 12.0*  HCT 49.0 39.3 38.6*   BMET Recent Labs    02/16/19 0746 02/17/19 0433  NA 143 136  K 3.7 4.4  CL 100 101  CO2  --  25  GLUCOSE 88 86  BUN 13 14  CREATININE 0.80 1.04  CALCIUM  --  8.4*     Studies/Results: Dg Chest Port 1 View  Result Date: 02/16/2019 CLINICAL DATA:  Fever, postop EXAM: PORTABLE CHEST 1 VIEW COMPARISON:  None. FINDINGS: Linear densities at the left base, favor atelectasis. Right lung clear. Heart is normal size. No effusions or acute bony abnormality. IMPRESSION: Left base atelectasis. Electronically Signed   By: Rolm Baptise M.D.   On: 02/16/2019 23:42    Assessment/Plan: POD 1 s/p TURP with post-op fever  -Blood and urine cxs are pending.   -Continue cipro and start cefepime to broad abx coverage -I encouraged him to perform incentive spirometry -CBI PRN -TTF.  Will  continue to monitor   LOS: 0 days   Ellison Hughs, MD [facility] Pager: [phone]  02/17/2019, 9:10 AM

## 2019-02-17 NOTE — Anesthesia Postprocedure Evaluation (Signed)
Anesthesia Post Note  Patient: [name]  Procedure(s) Performed: TRANSURETHRAL RESECTION OF THE PROSTATE (TURP), BIPOLAR (N/A Prostate)     Patient location during evaluation: PACU Anesthesia Type: General Level of consciousness: awake and alert Pain management: pain level controlled Vital Signs Assessment: post-procedure vital signs reviewed and stable Respiratory status: spontaneous breathing, nonlabored ventilation, respiratory function stable and patient connected to nasal cannula oxygen Cardiovascular status: blood pressure returned to baseline and stable Postop Assessment: no apparent nausea or vomiting Anesthetic complications: no    Last Vitals:  Vitals:   02/17/19 1011 02/17/19 1107  BP: (!) 103/45 (!) 111/51  Pulse: 74   Resp: 18 20  Temp:  36.9 C  SpO2: 95%     Last Pain:  Vitals:   02/17/19 0914  TempSrc:   PainSc: 0-No pain                 Demarkis Gheen DAVID

## 2019-02-17 NOTE — Progress Notes (Signed)
Pt with continued febrile episodes, refusing to eat and perform incentive spirometry, IVF at 75, informed Dr. Lovena Neighbours. Transferring pt to 4th floor for admission.

## 2019-02-17 NOTE — Progress Notes (Signed)
Patient's temperature back up to 101.1 this am. On call MD was notified. Patient given 650mg  of oral tylenol. Patient continues to deny pain or discomfort. No further orders at this time. Will wait for Dr. Lovena Neighbours to make his a.m. rounds. Will continue to closely monitor patient.

## 2019-02-18 ENCOUNTER — Inpatient Hospital Stay (HOSPITAL_COMMUNITY): Payer: Medicare HMO

## 2019-02-18 ENCOUNTER — Encounter (HOSPITAL_BASED_OUTPATIENT_CLINIC_OR_DEPARTMENT_OTHER): Payer: Self-pay | Admitting: Urology

## 2019-02-18 LAB — CBC
HCT: 38.7 % — ABNORMAL LOW (ref 39.0–52.0)
Hemoglobin: 11.8 g/dL — ABNORMAL LOW (ref 13.0–17.0)
MCH: 28 pg (ref 26.0–34.0)
MCHC: 30.5 g/dL (ref 30.0–36.0)
MCV: 91.9 fL (ref 80.0–100.0)
Platelets: 111 10*3/uL — ABNORMAL LOW (ref 150–400)
RBC: 4.21 MIL/uL — ABNORMAL LOW (ref 4.22–5.81)
RDW: 12.9 % (ref 11.5–15.5)
WBC: 7.3 10*3/uL (ref 4.0–10.5)
nRBC: 0 % (ref 0.0–0.2)

## 2019-02-18 MED ORDER — PHENAZOPYRIDINE HCL 200 MG PO TABS: 200.0000 mg | ORAL_TABLET | Freq: Three times a day (TID) | ORAL | 0 refills | Status: DC | PRN

## 2019-02-18 MED ORDER — OXYBUTYNIN CHLORIDE 5 MG PO TABS: 5.0000 mg | ORAL_TABLET | Freq: Three times a day (TID) | ORAL | 1 refills | Status: DC | PRN

## 2019-02-18 MED ORDER — CIPROFLOXACIN HCL 500 MG PO TABS: 500.0000 mg | ORAL_TABLET | Freq: Two times a day (BID) | ORAL | 0 refills | Status: AC

## 2019-02-18 MED ORDER — TRAMADOL HCL 50 MG PO TABS: 50.0000 mg | ORAL_TABLET | Freq: Four times a day (QID) | ORAL | 0 refills | Status: AC | PRN

## 2019-02-18 NOTE — Progress Notes (Signed)
AVS given to patient and pt's daughter and explained at the bedside. Medications and follow up appointments have been explained with pt and pt's daughter verbalizing understanding.

## 2019-02-18 NOTE — Discharge Summary (Signed)
Date of admission: 02/16/2019  Date of discharge: 02/18/2019  Admission diagnosis: BPH with urinary retention  Discharge diagnosis: Same  Secondary diagnoses:  Post-op fever of unknown origin  Surgeries:  Bipolar TURP on 02/16/19  History and Physical: For full details, please see admission history and physical. Briefly, [name] is a 83 y.o. year old patient with a history of BPH with urinary retention.  He is s/p TURP on 02/16/19.  On POD1, the patient spiked a fever of 102F that has subsequently responded with Tylenol and IV cipro and cefipime.  Blood cultures show no growth to date and his urine culture shows <10,000 CFU.  He has been afebrile for the past 24 hours and is meeting discharge goals.    Hospital Course: as above  Physical Exam:  General: Alert and oriented CV: RRR, palpable distal pulses Lungs: CTAB, equal chest rise Abdomen: Soft, NTND, no rebound or guarding GU:  Foley in place and draining clear, yellow urine Ext: NT, No erythema Laboratory values:  Recent Labs    02/16/19 2318 02/17/19 0807 02/18/19 0515  HGB 12.1* 12.0* 11.8*  HCT 39.3 38.6* 38.7*   Recent Labs    02/16/19 0746 02/17/19 0433  CREATININE 0.80 1.04    Disposition: Home  Discharge instruction: The patient was instructed to be ambulatory but told to refrain from heavy lifting, strenuous activity, or driving.  Discharge medications:   Home medications were restarted  He will be sent home with a 5 day course of Cipro 500 mg BID, Tramadol, pyridium and oxybutynin.   Followup:  Follow-up Information    [facility] In 5 days.   Why: My office will call to schedule your appointment for catheter removal in ~5 days. Contact information: [place] [phone]

## 2019-02-22 LAB — CULTURE, BLOOD (ROUTINE X 2)
Culture: NO GROWTH
Culture: NO GROWTH
Special Requests: ADEQUATE
Special Requests: ADEQUATE

## 2019-03-23 DIAGNOSIS — R338 Other retention of urine: Secondary | ICD-10-CM | POA: Diagnosis not present

## 2019-03-23 DIAGNOSIS — N401 Enlarged prostate with lower urinary tract symptoms: Secondary | ICD-10-CM | POA: Diagnosis not present

## 2019-04-07 ENCOUNTER — Other Ambulatory Visit: Payer: Self-pay | Admitting: Internal Medicine

## 2019-04-13 ENCOUNTER — Encounter: Payer: Self-pay | Admitting: Family Medicine

## 2019-04-13 ENCOUNTER — Ambulatory Visit (INDEPENDENT_AMBULATORY_CARE_PROVIDER_SITE_OTHER): Payer: Medicare HMO | Admitting: Family Medicine

## 2019-04-13 ENCOUNTER — Ambulatory Visit (INDEPENDENT_AMBULATORY_CARE_PROVIDER_SITE_OTHER)
Admission: RE | Admit: 2019-04-13 | Discharge: 2019-04-13 | Disposition: A | Discharge status: Home / Self Care | Payer: Medicare HMO | Source: Ambulatory Visit | Attending: Family Medicine | Admitting: Family Medicine

## 2019-04-13 ENCOUNTER — Other Ambulatory Visit: Payer: Self-pay

## 2019-04-13 VITALS — BP 148/82 | HR 103 | Temp 97.0°F | Resp 14 | Ht 62.0 in | Wt 108.0 lb

## 2019-04-13 DIAGNOSIS — M25531 Pain in right wrist: Secondary | ICD-10-CM

## 2019-04-13 DIAGNOSIS — S62114A Nondisplaced fracture of triquetrum [cuneiform] bone, right wrist, initial encounter for closed fracture: Secondary | ICD-10-CM | POA: Diagnosis not present

## 2019-04-13 DIAGNOSIS — S6991XA Unspecified injury of right wrist, hand and finger(s), initial encounter: Secondary | ICD-10-CM | POA: Diagnosis not present

## 2019-04-13 DIAGNOSIS — M7989 Other specified soft tissue disorders: Secondary | ICD-10-CM | POA: Diagnosis not present

## 2019-04-13 NOTE — Progress Notes (Signed)
Subjective:     [name] is a 83 y.o. male presenting for Fall (on 04/10/2019. Hurt right wrist. Tripped outside and fell. )     HPI   #Right Wrist injury - tripped and fell - landed on outstretched arm - both hands took the fall - immediate pain in the right hand - swelling initially  - injury on 12/27 - seems to be more swollen today - fells warm   Review of Systems  Constitutional: Negative for chills and fever.     Social History   Tobacco Use  Smoking Status Former Smoker  . Packs/day: 1.00  . Years: 30.00  . Pack years: 30.00  . Quit date: 04/14/1993  . Years since quitting: 26.0  Smokeless Tobacco Never Used        Objective:    BP Readings from Last 3 Encounters:  04/13/19 (!) 148/82  02/18/19 125/61  01/11/19 130/72   Wt Readings from Last 3 Encounters:  04/13/19 108 lb (49 kg)  02/16/19 111 lb (50.3 kg)  01/11/19 110 lb 3.2 oz (50 kg)    BP (!) 148/82   Pulse (!) 103   Temp (!) 97 F (36.1 C)   Resp 14   Ht 5\' 2"  (1.575 m)   Wt 108 lb (49 kg)   BMI 19.75 kg/m    Physical Exam Constitutional:      Appearance: Normal appearance. He is not ill-appearing or diaphoretic.  HENT:     Right Ear: External ear normal.     Left Ear: External ear normal.     Nose:     Comments: Wearing mask Eyes:     General: No scleral icterus.    Extraocular Movements: Extraocular movements intact.     Conjunctiva/sclera: Conjunctivae normal.  Cardiovascular:     Rate and Rhythm: Normal rate.  Pulmonary:     Effort: Pulmonary effort is normal.  Musculoskeletal:     Cervical back: Neck supple.     Comments: Right Wrist: Inspection: swelling of the wrist, hand, fingers Palpation: warm to touch, TTP along the dorsal and palmar wrist, and along the metatarsal bones. No finger TTP ROM: difficulty moving wrists, or flexing fingers Strength: deferred Normal sensation and normal pulses  Skin:    General: Skin is warm and dry.  Neurological:       Mental Status: He is alert. Mental status is at baseline.  Psychiatric:        Mood and Affect: Mood normal.    DG Wrist Complete Right CLINICAL DATA:  Wrist pain, swelling after fall  EXAM: RIGHT WRIST - COMPLETE 3+ VIEW  COMPARISON:  None.  FINDINGS: Small corticated bone fragments noted off the distal radius and ulna, likely related to remote injury. Small bone fragment also noted posteriorly on the lateral view off the triquetrum which appears acute. Overlying soft tissue swelling. No subluxation or dislocation.  IMPRESSION: Probable acute triquetral avulsion fracture.  Suspect old fractures from the distal radius and ulna.  Electronically Signed   By: Rolm Baptise M.D.   On: 04/13/2019 10:23        Assessment & Plan:   Problem List Items Addressed This Visit    None    Visit Diagnoses    Right wrist pain    -  Primary   Relevant Orders   DG Wrist Complete Right (Completed)   Ambulatory referral to Orthopedic Surgery   Closed nondisplaced fracture of triquetrum of right wrist, initial encounter  Relevant Orders   Ambulatory referral to Orthopedic Surgery     Pt does not recall old fractures. Given new fracture - Referral to ortho for management.    Return if symptoms worsen or fail to improve.  Lesleigh Noe, MD

## 2019-04-27 DIAGNOSIS — S63501A Unspecified sprain of right wrist, initial encounter: Secondary | ICD-10-CM | POA: Diagnosis not present

## 2019-05-09 ENCOUNTER — Ambulatory Visit (INDEPENDENT_AMBULATORY_CARE_PROVIDER_SITE_OTHER): Payer: Medicare HMO | Admitting: Primary Care

## 2019-05-09 ENCOUNTER — Other Ambulatory Visit: Payer: Self-pay

## 2019-05-09 ENCOUNTER — Encounter: Payer: Self-pay | Admitting: Primary Care

## 2019-05-09 VITALS — BP 126/76 | HR 77 | Temp 96.7°F | Ht 62.0 in | Wt 111.0 lb

## 2019-05-09 DIAGNOSIS — N401 Enlarged prostate with lower urinary tract symptoms: Secondary | ICD-10-CM

## 2019-05-09 DIAGNOSIS — E782 Mixed hyperlipidemia: Secondary | ICD-10-CM | POA: Diagnosis not present

## 2019-05-09 DIAGNOSIS — R7303 Prediabetes: Secondary | ICD-10-CM | POA: Diagnosis not present

## 2019-05-09 DIAGNOSIS — I25119 Atherosclerotic heart disease of native coronary artery with unspecified angina pectoris: Secondary | ICD-10-CM | POA: Diagnosis not present

## 2019-05-09 DIAGNOSIS — I10 Essential (primary) hypertension: Secondary | ICD-10-CM | POA: Diagnosis not present

## 2019-05-09 DIAGNOSIS — N138 Other obstructive and reflux uropathy: Secondary | ICD-10-CM | POA: Diagnosis not present

## 2019-05-09 LAB — LIPID PANEL
Cholesterol: 112 mg/dL (ref 0–200)
HDL: 44.1 mg/dL (ref >39.00)
LDL Cholesterol: 49 mg/dL (ref 0–99)
NonHDL: 68.25
Total CHOL/HDL Ratio: 3
Triglycerides: 97 mg/dL (ref 0.0–149.0)
VLDL: 19.4 mg/dL (ref 0.0–40.0)

## 2019-05-09 LAB — COMPREHENSIVE METABOLIC PANEL
ALT: 10 U/L (ref 0–53)
AST: 14 U/L (ref 0–37)
Albumin: 4 g/dL (ref 3.5–5.2)
Alkaline Phosphatase: 97 U/L (ref 39–117)
BUN: 16 mg/dL (ref 6–23)
CO2: 31 mEq/L (ref 19–32)
Calcium: 9.4 mg/dL (ref 8.4–10.5)
Chloride: 104 mEq/L (ref 96–112)
Creatinine, Ser: 0.81 mg/dL (ref 0.40–1.50)
GFR: 91.37 mL/min (ref >60.00)
Glucose, Bld: 87 mg/dL (ref 70–99)
Potassium: 5 mEq/L (ref 3.5–5.1)
Sodium: 140 mEq/L (ref 135–145)
Total Bilirubin: 0.5 mg/dL (ref 0.2–1.2)
Total Protein: 7 g/dL (ref 6.0–8.3)

## 2019-05-09 LAB — HEMOGLOBIN A1C: Hgb A1c MFr Bld: 6.1 % (ref 4.6–6.5)

## 2019-05-09 NOTE — Assessment & Plan Note (Signed)
Very little LUTS. TURP in November 2020. Following with Urology.

## 2019-05-09 NOTE — Progress Notes (Signed)
Subjective:    Patient ID: [name], male    DOB: [birth year], 84 y.o.   MRN: [id number]  HPI  This visit occurred during the SARS-CoV-2 public health emergency.  Safety protocols were in place, including screening questions prior to the visit, additional usage of staff PPE, and extensive cleaning of exam room while observing appropriate contact time as indicated for disinfecting solutions.   [name] is a 84 year old male who presents today for follow up.  1) Prediabetes: A1C of 6.4 in July 2020. No history of diabetes, not currently managed on medication.  2) Hyperlipidemia/CAD/Ischemic Heart Disease: Currently managed on simvastatin 10 mg. Last lipid panel on file is from 2019, unremarkable. Currently following with cardiology with last visit in September 2020  3) Essential Hypertension: Currently managed on ramipril 2.5 mg and metoprolol tartrate 50 mg. He stopped ramipril quite some time ago due to hypotension.   BP Readings from Last 3 Encounters:  05/09/19 126/76  04/13/19 (!) 148/82  02/18/19 125/61     4) BPH/Urinary Incontinence: Previously managed on oxybutynin 5 mg TID PRN. Following with Urology, underwent TURP in November 2020, doing well since. He denies hematuria, difficulty urinating.  Review of Systems  Respiratory: Negative for shortness of breath.   Cardiovascular: Negative for chest pain.  Genitourinary: Negative for difficulty urinating and hematuria.  Neurological: Negative for dizziness and headaches.       Past Medical History:  Diagnosis Date  . BPH (benign prostatic hyperplasia)   . Coronary artery disease cardiologist--- dr Carleene Overlie taylor--- hx two MI's and total 3 BMSs   dx MI 1996 w/ cardiac cath stent proxRCA;   NSTEMI  04-23-2000  cardiac cath w/ PCI and stenting to distalRCA and mid RCA;   10-27-2000 cardiac cath w/ PCI cutting balloon angioplasty for in-stent restenosis dRCA and mRCA (non-overlapping stents);  03-14-2006  cardiac  cath w/ no intervention , nonsobstructive   . Dyslipidemia   . Foley catheter in place    02-15-2019  per pt wife going on two weeks no urine goes in bag , so pt's sits on toliet and urinates around catheter,  states pt is not complainging of abd. pain or no emptying bladder  . Full dentures   . History of GI bleed    07/ 2002   per discharge note , EGD ulceration/ NG tube injury,  blood transfusion  . History of kidney injury    10-26-2018 due to obstruction--- resolved   . Hydrocele, bilateral   . Hypertension   . Memory impairment   . PAC (premature atrial contraction)   . RBBB (right bundle branch block)   . Skin lesions, generalized   . Urinary retention   . Wears glasses      Social History   Socioeconomic History  . Marital status: Married    Spouse name: BETTY  . Number of children: 6  . Years of education: 45  . Highest education level: 8th grade  Occupational History  . Occupation: retired Dealer  Tobacco Use  . Smoking status: Former Smoker    Packs/day: 1.00    Years: 30.00    Pack years: 30.00    Quit date: 04/14/1993    Years since quitting: 26.0  . Smokeless tobacco: Never Used  Substance and Sexual Activity  . Alcohol use: No  . Drug use: Never  . Sexual activity: Not Currently    Birth control/protection: None  Other Topics Concern  . Not on file  Social History Narrative   Married.   6 children, 12 grandchildren, 12 great grandchildren.   Once worked as a Games developer.   Enjoys relaxing, spending time with family.   Social Determinants of Health   Financial Resource Strain: Low Risk   . Difficulty of Paying Living Expenses: Not hard at all  Food Insecurity: No Food Insecurity  . Worried About Charity fundraiser in the Last Year: Never true  . Ran Out of Food in the Last Year: Never true  Transportation Needs: No Transportation Needs  . Lack of Transportation (Medical): No  . Lack of Transportation (Non-Medical): No  Physical Activity:  Inactive  . Days of Exercise per Week: 0 days  . Minutes of Exercise per Session: 0 min  Stress: No Stress Concern Present  . Feeling of Stress : Not at all  Social Connections: Slightly Isolated  . Frequency of Communication with Friends and Family: More than three times a week  . Frequency of Social Gatherings with Friends and Family: More than three times a week  . Attends Religious Services: 1 to 4 times per year  . Active Member of Clubs or Organizations: No  . Attends Archivist Meetings: Never  . Marital Status: Married  Human resources officer Violence: Not At Risk  . Fear of Current or Ex-Partner: No  . Emotionally Abused: No  . Physically Abused: No  . Sexually Abused: No    Past Surgical History:  Procedure Laterality Date  . APPENDECTOMY  child  . CARDIAC CATHETERIZATION  03-14-2006   dr g. taylor   nonobtructive cad 70% mLAD, 50% dLAD, 70% ostial D1, irregularities ti CFx,  20% narrowing in mid & distal RCA stent 40% very distal RCA narrowing,  inferoapical wall akinesis w/ ef 55%  . CHOLECYSTECTOMY OPEN  1970s  . CORONARY ANGIOPLASTY  10-28-2018   dr g. taylor   cutting balloon angioplasty to in-stent restenosis of dRCA and mRCA  . CORONARY ANGIOPLASTY WITH STENT PLACEMENT  1996   PTCA w/ stenting to proximal RCA  . CORONARY ANGIOPLASTY WITH STENT PLACEMENT  04-23-2000   dr g. taylor   PCI w/ stenting to distal RCA and mid RCA  (non-overlapping)  . INGUINAL HERNIA REPAIR  1980s   unilateral, unsure which side  . TONSILLECTOMY  child  . TRANSURETHRAL RESECTION OF PROSTATE N/A 02/16/2019   Procedure: TRANSURETHRAL RESECTION OF THE PROSTATE (TURP), BIPOLAR;  Surgeon: Ceasar Mons, MD;  Location: [facility];  Service: Urology;  Laterality: N/A;    Family History  Problem Relation Age of Onset  . Asthma Mother     No Known Allergies  Current Outpatient Medications on File Prior to Visit  Medication Sig Dispense Refill  . aspirin  EC 81 MG tablet Take 81 mg by mouth daily.    Marland Kitchen oxybutynin (DITROPAN) 5 MG tablet Take 1 tablet (5 mg total) by mouth every 8 (eight) hours as needed for bladder spasms. 30 tablet 1  . simvastatin (ZOCOR) 10 MG tablet Take 1 tablet (10 mg total) by mouth at bedtime. (Patient taking differently: Take 10 mg by mouth at bedtime. ) 90 tablet 3  . metoprolol tartrate (LOPRESSOR) 50 MG tablet Take 1 tablet (50 mg total) by mouth 2 (two) times daily. (Patient not taking: Reported on 05/09/2019) 90 tablet 2   No current facility-administered medications on file prior to visit.    BP 126/76   Pulse 77   Temp (!) 96.7 F (35.9 C) (Temporal)  Ht 5\' 2"  (1.575 m)   Wt 111 lb (50.3 kg)   SpO2 98%   BMI 20.30 kg/m    Objective:   Physical Exam  Constitutional: He appears well-nourished.  Cardiovascular: Normal rate and regular rhythm.  Respiratory: Effort normal and breath sounds normal.  Musculoskeletal:     Cervical back: Neck supple.  Skin: Skin is warm and dry.  Psychiatric: He has a normal mood and affect.           Assessment & Plan:

## 2019-05-09 NOTE — Patient Instructions (Signed)
Stop by the lab prior to leaving today. I will notify you of your results once received.   Continue off of the ramipril medication for now.  It was a pleasure to see you today!

## 2019-05-09 NOTE — Assessment & Plan Note (Signed)
A1C of 6.4 from last year, repeat A1C pending.

## 2019-05-09 NOTE — Assessment & Plan Note (Addendum)
Well controlled in the office today on metoprolol tartrate, continue off ramipril for now. CMP pending.

## 2019-05-09 NOTE — Assessment & Plan Note (Signed)
Compliant to statin and aspirin. Asymptomatic. Labs pending.

## 2019-05-09 NOTE — Assessment & Plan Note (Signed)
Compliant to simvastatin, repeat lipid panel pending.

## 2019-05-25 DIAGNOSIS — S63501D Unspecified sprain of right wrist, subsequent encounter: Secondary | ICD-10-CM | POA: Diagnosis not present

## 2019-06-22 ENCOUNTER — Ambulatory Visit: Admission: RE | Admit: 2019-06-22 | Payer: Medicare HMO | Source: Ambulatory Visit | Admitting: *Deleted

## 2019-06-22 ENCOUNTER — Telehealth: Payer: Self-pay | Admitting: *Deleted

## 2019-06-22 ENCOUNTER — Ambulatory Visit
Admission: RE | Admit: 2019-06-22 | Discharge: 2019-06-22 | Disposition: A | Discharge status: Home / Self Care | Payer: Medicare HMO | Source: Ambulatory Visit | Attending: Unknown Physician Specialty | Admitting: Unknown Physician Specialty

## 2019-06-22 ENCOUNTER — Other Ambulatory Visit: Payer: Self-pay

## 2019-06-22 ENCOUNTER — Encounter: Payer: Self-pay | Admitting: Unknown Physician Specialty

## 2019-06-22 ENCOUNTER — Ambulatory Visit (INDEPENDENT_AMBULATORY_CARE_PROVIDER_SITE_OTHER): Payer: Self-pay | Admitting: Unknown Physician Specialty

## 2019-06-22 VITALS — BP 158/78 | HR 89 | Temp 97.5°F | Resp 18 | Ht 62.0 in | Wt 110.0 lb

## 2019-06-22 DIAGNOSIS — R059 Cough, unspecified: Secondary | ICD-10-CM

## 2019-06-22 DIAGNOSIS — R0602 Shortness of breath: Secondary | ICD-10-CM | POA: Diagnosis not present

## 2019-06-22 DIAGNOSIS — R05 Cough: Secondary | ICD-10-CM | POA: Diagnosis not present

## 2019-06-22 DIAGNOSIS — I509 Heart failure, unspecified: Secondary | ICD-10-CM

## 2019-06-22 MED ORDER — LISINOPRIL 5 MG PO TABS: 5.0000 mg | ORAL_TABLET | Freq: Every day | ORAL | 3 refills | Status: DC

## 2019-06-22 NOTE — Progress Notes (Signed)
BP (!) 158/78 (BP Location: Left Arm, Patient Position: Sitting, Cuff Size: Small)   Pulse 89   Temp (!) 97.5 F (36.4 C) (Temporal)   Resp 18   Ht 5\' 2"  (1.575 m)   Wt 110 lb (49.9 kg)   SpO2 99%   BMI 20.12 kg/m    Subjective:    Patient ID: [name], male    DOB: Jul 20, 1935, 84 y.o.   MRN: [id number]  HPI: [name] is a 84 y.o. male  Chief Complaint  Patient presents with  . Shortness of Breath    x 62mths, SOB worsen with exertion    Pt is here with wife who gives part of the history.  Pt is a poor historian but his wife is primarily concerned with increased episodes of SOB with exertion and intermittent cough.  Additionally, he is falling asleep during the day which is unlike him and she is concerned about a wood stove.  Hx significant for CAD.    Shortness of Breath This is a new problem. Episode onset: 2 months but unclear. The problem occurs intermittently. The problem has been unchanged. Pertinent negatives include no abdominal pain, chest pain, claudication, coryza, ear pain, fever, leg pain, leg swelling, orthopnea, rhinorrhea, sore throat, syncope or wheezing. The symptoms are aggravated by any activity. The patient has no known risk factors for DVT/PE. He has tried nothing for the symptoms.    Relevant past medical, surgical, family and social history reviewed and updated as indicated. Interim medical history since our last visit reviewed. Allergies and medications reviewed and updated.  Review of Systems  Constitutional: Negative for fever.  HENT: Negative for ear pain, rhinorrhea and sore throat.   Respiratory: Positive for shortness of breath. Negative for wheezing.   Cardiovascular: Negative for chest pain, orthopnea, claudication, leg swelling and syncope.  Gastrointestinal: Negative for abdominal pain.    Per HPI unless specifically indicated above     Objective:    BP (!) 158/78 (BP Location: Left Arm, Patient Position: Sitting,  Cuff Size: Small)   Pulse 89   Temp (!) 97.5 F (36.4 C) (Temporal)   Resp 18   Ht 5\' 2"  (1.575 m)   Wt 110 lb (49.9 kg)   SpO2 99%   BMI 20.12 kg/m   Wt Readings from Last 3 Encounters:  06/22/19 110 lb (49.9 kg)  05/09/19 111 lb (50.3 kg)  04/13/19 108 lb (49 kg)    Physical Exam Constitutional:      General: He is not in acute distress.    Appearance: Normal appearance. He is well-developed.  HENT:     Head: Normocephalic and atraumatic.  Eyes:     General: Lids are normal. No scleral icterus.       Right eye: No discharge.        Left eye: No discharge.     Conjunctiva/sclera: Conjunctivae normal.  Neck:     Vascular: No carotid bruit or JVD.  Cardiovascular:     Rate and Rhythm: Normal rate and regular rhythm.     Heart sounds: Normal heart sounds.  Pulmonary:     Effort: Pulmonary effort is normal. No respiratory distress.     Breath sounds: Normal breath sounds.  Abdominal:     Palpations: There is no hepatomegaly or splenomegaly.  Musculoskeletal:        General: Normal range of motion.     Cervical back: Normal range of motion and neck supple.  Skin:  General: Skin is warm and dry.     Coloration: Skin is not pale.     Findings: No rash.  Neurological:     Mental Status: He is alert and oriented to person, place, and time.  Psychiatric:        Behavior: Behavior normal.        Thought Content: Thought content normal.        Judgment: Judgment normal.    Chest x-ray shows mild pulmonary edema and small pleural effusions consistent with CHF.  No evidence on pneumonia  Results for orders placed or performed in visit on 05/09/19  Comprehensive metabolic panel  Result Value Ref Range   Sodium 140 135 - 145 mEq/L   Potassium 5.0 3.5 - 5.1 mEq/L   Chloride 104 96 - 112 mEq/L   CO2 31 19 - 32 mEq/L   Glucose, Bld 87 70 - 99 mg/dL   BUN 16 6 - 23 mg/dL   Creatinine, Ser 0.81 0.40 - 1.50 mg/dL   Total Bilirubin 0.5 0.2 - 1.2 mg/dL   Alkaline  Phosphatase 97 39 - 117 U/L   AST 14 0 - 37 U/L   ALT 10 0 - 53 U/L   Total Protein 7.0 6.0 - 8.3 g/dL   Albumin 4.0 3.5 - 5.2 g/dL   GFR 91.37 >60.00 mL/min   Calcium 9.4 8.4 - 10.5 mg/dL  Lipid panel  Result Value Ref Range   Cholesterol 112 0 - 200 mg/dL   Triglycerides 97.0 0.0 - 149.0 mg/dL   HDL 44.10 >39.00 mg/dL   VLDL 19.4 0.0 - 40.0 mg/dL   LDL Cholesterol 49 0 - 99 mg/dL   Total CHOL/HDL Ratio 3    NonHDL 68.25   Hemoglobin A1c  Result Value Ref Range   Hgb A1c MFr Bld 6.1 4.6 - 6.5 %      Assessment & Plan:   Problem List Items Addressed This Visit    None    Visit Diagnoses    Cough    -  Primary   Relevant Orders   DG Chest 2 View (Completed)   Novel Coronavirus, NAA ([facility])   SOB (shortness of breath)       Showed appropriate inhaler technique.  Spacer given. Continue with Dulera and Amoxil.     Relevant Orders   DG Chest 2 View (Completed)   Novel Coronavirus, NAA ([facility])   Congestive heart failure, unspecified HF chronicity, unspecified heart failure type ([place])       Chest x-ray c/w CHF.  Encouraged to see his cardiologist soon.  Started Lisinopril 5 mg for CHF and hypertension today.  Diuretic not necessary as stable    Relevant Medications   lisinopril (ZESTRIL) 5 MG tablet        Follow up plan: Return f/u with PCP and cadiologist.

## 2019-06-22 NOTE — Telephone Encounter (Signed)
Patient's wife notified as instructed by telephone and verbalized understanding. Mrs. Shannahan requested that I call and give the information to their daughter [name] because she would probably be the one to take him for an appointment this evening. Spoke to [place] and an appointment was scheduled at the [facility] in [place] today at 6:15 pm. ER precautions were given to [name] and she verbalized understanding.

## 2019-06-22 NOTE — Telephone Encounter (Signed)
Noted  

## 2019-06-22 NOTE — Telephone Encounter (Signed)
Noted. Patient needs evaluation including auscultation, vital signs (pulse ox), and perhaps chest xray. Please try to set him up with the respiratory clinic this evening.

## 2019-06-22 NOTE — Telephone Encounter (Signed)
Patient's daughter called stating that her mom called her and told her that her dad is having SOB. Patient's daughter was advised by Morey Hummingbird that we do not have a DPR and the triage nurse would call her dad.  Called and spoke to patient and his wife. Was advised that patient has had a cold for about a week. Patient stated that he has had a dry cough and not able to get much up. Patient stated that he does not think that he has had a fever, but not sure. Patient stated that he does have SOB, but has had that for a while with exertion. Patient stated that he has been taking OTC medications for his cold. Patient's wife stated that she does hear him wheezing at night. Patient's wife stated that he is staying up really late at night and wants to nap all day and that is unusual for him. Patient stated that his chest has hurt, but not sure if it is heart or lungs? Patient did say that he feels that the SOB has been a little worse since the cold.  Patient stated that his body aches may be a little worse also.

## 2019-06-23 DIAGNOSIS — N400 Enlarged prostate without lower urinary tract symptoms: Secondary | ICD-10-CM | POA: Diagnosis not present

## 2019-06-23 LAB — SPECIMEN STATUS REPORT

## 2019-06-23 LAB — NOVEL CORONAVIRUS, NAA: SARS-CoV-2, NAA: NOT DETECTED

## 2019-06-24 NOTE — Progress Notes (Signed)
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 They are seen for Dr. Lovena Le.   Since last being seen in our clinic, the patient reports doing well.  He comes today to discuss his SOB. Per him, this is chronics (years), with no recent exacerbations. He has had a cough over the past couple of months, dry; COVID negative x 2. He is able to get around the house and do his ADLs without much difficult. 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 He is SOB walking around a grocery store and has to take seated breaks at times. He smoked for over 20 years, but has been quit for some time. He denies any chest pain regardless of activity. He DID have CP with his MIs in the past.   The patient feels that he is tolerating medications without difficulties and is otherwise without complaint today.   Past Medical History:  Diagnosis Date  . BPH (benign prostatic hyperplasia)   . Coronary artery disease cardiologist--- dr Carleene Overlie taylor--- hx two MI's and total 3 BMSs   dx MI 1996 w/ cardiac cath stent proxRCA;   NSTEMI  04-23-2000  cardiac cath w/ PCI and stenting to distalRCA and mid RCA;   10-27-2000 cardiac cath w/ PCI cutting balloon angioplasty for in-stent restenosis dRCA and mRCA (non-overlapping stents);  03-14-2006  cardiac cath w/ no intervention , nonsobstructive   . Dyslipidemia   . Foley catheter in place    02-15-2019  per pt wife going on two weeks no urine goes in bag , so pt's sits on toliet and urinates around catheter,  states pt is not complainging of abd. pain or no emptying bladder  . Full dentures   . History of GI bleed    07/ 2002   per discharge note , EGD ulceration/ NG tube injury,  blood transfusion  . History of kidney injury    10-26-2018 due to obstruction---  resolved   . Hydrocele, bilateral   . Hypertension   . Memory impairment   . PAC (premature atrial contraction)   . RBBB (right bundle branch block)   . Skin lesions, generalized   . Urinary retention   . Wears glasses    Past Surgical History:  Procedure Laterality Date  . APPENDECTOMY  child  . CARDIAC CATHETERIZATION  03-14-2006   dr g. taylor   nonobtructive cad 70% mLAD, 50% dLAD, 70% ostial D1, irregularities ti CFx,  20% narrowing in mid & distal RCA stent 40% very distal RCA narrowing,  inferoapical wall akinesis w/ ef 55%  . CHOLECYSTECTOMY OPEN  1970s  . CORONARY ANGIOPLASTY  10-28-2018   dr g. taylor   cutting balloon angioplasty to in-stent restenosis of dRCA and mRCA  . CORONARY ANGIOPLASTY WITH STENT PLACEMENT  1996   PTCA w/ stenting to proximal RCA  . CORONARY ANGIOPLASTY WITH STENT PLACEMENT  04-23-2000   dr g. taylor   PCI w/ stenting to distal RCA and mid RCA  (non-overlapping)  . INGUINAL HERNIA REPAIR  1980s   unilateral, unsure which side  . TONSILLECTOMY  child  . TRANSURETHRAL RESECTION OF PROSTATE N/A 02/16/2019   Procedure: TRANSURETHRAL RESECTION OF THE PROSTATE (TURP), BIPOLAR;  Surgeon: Ceasar Mons, MD;  Location:  [facility];  Service: Urology;  Laterality: N/A;    Current Outpatient Medications  Medication Sig Dispense Refill  . aspirin EC 81 MG tablet Take 81 mg by mouth daily.    Marland Kitchen lisinopril (ZESTRIL) 5 MG tablet Take 1 tablet (5 mg total) by mouth daily. 90 tablet 3  . metoprolol tartrate (LOPRESSOR) 50 MG tablet Take 1 tablet (50 mg total) by mouth 2 (two) times daily. 90 tablet 2  . simvastatin (ZOCOR) 10 MG tablet Take 1 tablet (10 mg total) by mouth at bedtime. 90 tablet 3   No current facility-administered medications for this visit.    No Known Allergies  Social History   Socioeconomic History  . Marital status: Married    Spouse name: BETTY  . Number of children: 6  . Years of education: 17  .  Highest education level: 8th grade  Occupational History  . Occupation: retired Dealer  Tobacco Use  . Smoking status: Former Smoker    Packs/day: 1.00    Years: 30.00    Pack years: 30.00    Quit date: 04/14/1993    Years since quitting: 26.2  . Smokeless tobacco: Never Used  Substance and Sexual Activity  . Alcohol use: No  . Drug use: Never  . Sexual activity: Not Currently    Birth control/protection: None  Other Topics Concern  . Not on file  Social History Narrative   Married.   6 children, 12 grandchildren, 12 great grandchildren.   Once worked as a Games developer.   Enjoys relaxing, spending time with family.   Social Determinants of Health   Financial Resource Strain: Low Risk   . Difficulty of Paying Living Expenses: Not hard at all  Food Insecurity: No Food Insecurity  . Worried About Charity fundraiser in the Last Year: Never true  . Ran Out of Food in the Last Year: Never true  Transportation Needs: No Transportation Needs  . Lack of Transportation (Medical): No  . Lack of Transportation (Non-Medical): No  Physical Activity: Inactive  . Days of Exercise per Week: 0 days  . Minutes of Exercise per Session: 0 min  Stress: No Stress Concern Present  . Feeling of Stress : Not at all  Social Connections: Slightly Isolated  . Frequency of Communication with Friends and Family: More than three times a week  . Frequency of Social Gatherings with Friends and Family: More than three times a week  . Attends Religious Services: 1 to 4 times per year  . Active Member of Clubs or Organizations: No  . Attends Archivist Meetings: Never  . Marital Status: Married  Human resources officer Violence: Not At Risk  . Fear of Current or Ex-Partner: No  . Emotionally Abused: No  . Physically Abused: No  . Sexually Abused: No    Physical Exam: Vitals:   06/27/19 1138  Height: 5\' 2"  (1.575 m)    GEN- The patient is elderly ell appearing, alert and oriented x 3 today.     HEENT: normocephalic, atraumatic; sclera clear, conjunctiva pink; hearing intact; oropharynx clear; neck supple, no JVP Lymph- no cervical lymphadenopathy Lungs- Clear to ausculation bilaterally, normal work of breathing.  No wheezes, rales, rhonchi Heart- Regular rate and rhythm, no murmurs, rubs or gallops, PMI not laterally displaced GI- soft, non-tender, non-distended, bowel sounds present, no hepatosplenomegaly Extremities- no clubbing, cyanosis, or edema; DP/PT/radial pulses 2+ bilaterally MS- no significant deformity or atrophy Skin- warm and dry, no rash or lesion Psych-  euthymic mood, full affect Neuro- strength and sensation are intact  EKG is ordered. Personal review of EKG from today shows NSR with RBBB with QRS 124 ms, Ventricular rate 76 bpm.   Assessment and Plan:  1. Chronic systolic CHF Echo [id number] with LVEF 45-50% Will update NYHA II-III symptoms. Confounded by COPD Volume status stable on exam. No edema. Continue lisinopril 5 mg daily Switch Lopressor to Bisoprolol 5 mg daily with COPD. Will repeat Echo to further clarify.   2. CAD S/p remote MI Denies ischemic symptoms Though needs repeat Echo as above  3. HTN Will adjust meds in setting of treating his CHF  4. SOB He has smoked for > 20 years.  This comes and goes without any specific aggravating factor.  He is SOB with moderate exertion.  I suspect his SOB is multifactorial with general deconditioning and COPD component.  Will discuss further with PCP ? Would benefit from inhaler/COPD medications. It appears there was some discussion of this at his last visit, but he denies having any inhaler.   Shirley Friar, PA-C  06/27/19 11:54 AM

## 2019-06-27 ENCOUNTER — Other Ambulatory Visit: Payer: Self-pay

## 2019-06-27 ENCOUNTER — Encounter: Payer: Self-pay | Admitting: Student

## 2019-06-27 ENCOUNTER — Ambulatory Visit: Payer: Medicare HMO | Admitting: Student

## 2019-06-27 VITALS — BP 140/74 | HR 76 | Ht 64.0 in | Wt 111.4 lb

## 2019-06-27 DIAGNOSIS — I10 Essential (primary) hypertension: Secondary | ICD-10-CM

## 2019-06-27 DIAGNOSIS — I4510 Unspecified right bundle-branch block: Secondary | ICD-10-CM

## 2019-06-27 DIAGNOSIS — I5022 Chronic systolic (congestive) heart failure: Secondary | ICD-10-CM | POA: Diagnosis not present

## 2019-06-27 DIAGNOSIS — I25119 Atherosclerotic heart disease of native coronary artery with unspecified angina pectoris: Secondary | ICD-10-CM | POA: Diagnosis not present

## 2019-06-27 MED ORDER — BISOPROLOL FUMARATE 5 MG PO TABS: 5.0000 mg | ORAL_TABLET | Freq: Every day | ORAL | 3 refills | Status: AC

## 2019-06-27 NOTE — Patient Instructions (Addendum)
Medication Instructions:  STOP METOPROLOL  START BISOPROLOL 5 mg Daily *If you need a refill on your cardiac medications before your next appointment, please call your pharmacy*   Lab Work: none If you have labs (blood work) drawn today and your tests are completely normal, you will receive your results only by: Marland Kitchen MyChart Message (if you have MyChart) OR . A paper copy in the mail If you have any lab test that is abnormal or we need to change your treatment, we will call you to review the results.   Testing/Procedures:  Please Schedule Your physician has requested that you have an echocardiogram. Echocardiography is a painless test that uses sound waves to create images of your heart. It provides your doctor with information about the size and shape of your heart and how well your heart's chambers and valves are working. This procedure takes approximately one hour. There are no restrictions for this procedure.     Follow-Up: At [facility], you and your health needs are our priority.  As part of our continuing mission to provide you with exceptional heart care, we have created designated Provider Care Teams.  These Care Teams include your primary Cardiologist (physician) and Advanced Practice Providers (APPs -  Physician Assistants and Nurse Practitioners) who all work together to provide you with the care you need, when you need it.  We recommend signing up for the patient portal called "MyChart".  Sign up information is provided on this After Visit Summary.  MyChart is used to connect with patients for Virtual Visits (Telemedicine).  Patients are able to view lab/test results, encounter notes, upcoming appointments, etc.  Non-urgent messages can be sent to your provider as well.   To learn more about what you can do with MyChart, go to [email or web address].    Your next appointment:   6 month(s)  The format for your next appointment:   Either In Person or Virtual  Provider:    Dr Lovena Le   Other Instructions  Bisoprolol Tablets What is this medicine? BISOPROLOL (bis OH proe lol) is a beta blocker. It decreases the amount of work your heart has to do and helps your heart beat regularly. It is used to treat high blood pressure. This medicine may be used for other purposes; ask your health care provider or pharmacist if you have questions. COMMON BRAND NAME(S): Zebeta What should I tell my health care provider before I take this medicine? They need to know if you have any of these conditions:  chest pain (angina)  diabetes  heart or vessel disease like slow heart rate, worsening heart failure, heart block, sick sinus syndrome or Raynaud's disease  kidney disease  liver disease  lung or breathing disease, like asthma or emphysema  pheochromocytoma  thyroid disease  an unusual or allergic reaction to bisoprolol, other beta-blockers, medicines, foods, dyes, or preservatives  pregnant or trying to get pregnant  breast-feeding How should I use this medicine? Take this drug by mouth. Take it as directed on the prescription label at the same time every day. You can take it with or without food. If it upsets your stomach, take it with food. Keep taking it unless your health care provider tells you to stop. Talk to your health care provider about the use of this drug in children. Special care may be needed. Overdosage: If you think you have taken too much of this medicine contact a poison control center or emergency room at once. NOTE: This medicine is only  for you. Do not share this medicine with others. What if I miss a dose? If you miss a dose, take it as soon as you can. If it is almost time for your next dose, take only that dose. Do not take double or extra doses. What may interact with this medicine? This medicine may interact with the following medications:  certain medicines for blood pressure, heart disease, irregular heart beat  NSAIDs,  medicines for pain and inflammation, like ibuprofen or naproxen  rifampin This list may not describe all possible interactions. Give your health care provider a list of all the medicines, herbs, non-prescription drugs, or dietary supplements you use. Also tell them if you smoke, drink alcohol, or use illegal drugs. Some items may interact with your medicine. What should I watch for while using this medicine? Visit your doctor or health care professional for regular checks on your progress. Check your heart rate and blood pressure regularly while you are taking this medicine. Ask your doctor or health care professional what your heart rate and blood pressure should be, and when you should contact him or her. You may get drowsy or dizzy. Do not drive, use machinery, or do anything that needs mental alertness until you know how this drug affects you. Do not stand or sit up quickly, especially if you are an older patient. This reduces the risk of dizzy or fainting spells. Alcohol can make you more drowsy and dizzy. Avoid alcoholic drinks. This medicine may increase blood sugar. Ask your healthcare provider if changes in diet or medicines are needed if you have diabetes. Do not treat yourself for coughs, colds, or pain while you are taking this medicine without asking your doctor or health care professional for advice. Some ingredients may increase your blood pressure. What side effects may I notice from receiving this medicine? Side effects that you should report to your doctor or health care professional as soon as possible:  allergic reactions like skin rash, itching or hives, swelling of the face, lips, or tongue  breathing problems  chest pain  cold, tingling, or numb hands or feet  confusion  irregular, slow heartbeat  muscle aches and pains   signs and symptoms of high blood sugar such as being more thirsty or hungry or having to urinate more than normal. You may also feel very tired or  have blurry vision.  sweating  swollen legs or ankles  tremors  vomiting Side effects that usually do not require medical attention (report to your doctor or health care professional if they continue or are bothersome):  anxiety  change in sex drive or performance  depression  diarrhea  dry or burning eyes  headache  nausea This list may not describe all possible side effects. Call your doctor for medical advice about side effects. You may report side effects to FDA at 1-800-FDA-1088. Where should I keep my medicine? Keep out of the reach of children and pets. Store at room temperature between 20 and 25 degrees C (68 and 77 degrees F). Protect from light and moisture. Keep the container tightly closed. Throw away any unused drug after the expiration date. NOTE: This sheet is a summary. It may not cover all possible information. If you have questions about this medicine, talk to your doctor, pharmacist, or health care provider.  2020 Elsevier/Gold Standard (2018-11-11 16:46:40)

## 2019-07-01 ENCOUNTER — Telehealth: Payer: Self-pay | Admitting: Primary Care

## 2019-07-01 NOTE — Telephone Encounter (Signed)
Please notify patient that I would like to see him in the office for follow up from the respiratory clinic visit. We need to discuss his COPD. Thanks!

## 2019-07-01 NOTE — Telephone Encounter (Signed)
-----   Message from Kathrine Haddock, NP sent at 07/01/2019  4:29 PM EDT ----- Regarding: pt f/u Thanks for the note.  Lovely man who I saw in the respiratory clinic for an urgent visit.  I am forwarding this to his PCP.  I agree, appears for COPD and let the chest x-ray lead me to the possibility of heart failure.   ----- Message ----- From: Shirley Friar, PA-C Sent: 06/27/2019  12:49 PM EDT To: Kathrine Haddock, NP  Hello! I saw this pleasant gentleman today. I believe his SOB is chronic and more related to deconditioning and COPD. Your note mentions training him on how to use Dulera, but he and caretaker deny having any inhalers at home.  Just wanted to see if you thought he should be on chronic meds for likely underlying COPD.  Thank you!

## 2019-07-04 NOTE — Telephone Encounter (Signed)
Called patient to schedule. No answer and no voicemail.

## 2019-07-05 NOTE — Telephone Encounter (Signed)
Attempted to reach patient again with no answer and no voicemail set up.

## 2019-07-11 ENCOUNTER — Ambulatory Visit (HOSPITAL_COMMUNITY): Payer: Medicare HMO | Attending: Cardiology

## 2019-07-11 ENCOUNTER — Other Ambulatory Visit: Payer: Self-pay

## 2019-07-11 DIAGNOSIS — I25119 Atherosclerotic heart disease of native coronary artery with unspecified angina pectoris: Secondary | ICD-10-CM | POA: Diagnosis not present

## 2019-07-11 DIAGNOSIS — I4510 Unspecified right bundle-branch block: Secondary | ICD-10-CM | POA: Insufficient documentation

## 2019-07-11 DIAGNOSIS — I5022 Chronic systolic (congestive) heart failure: Secondary | ICD-10-CM | POA: Diagnosis not present

## 2019-07-31 NOTE — Progress Notes (Signed)
Cardiology Office Note   Date:  08/01/2019   ID:  [name], DOB 1935/10/09, MRN [id number]  PCP:  Pleas Koch, NP  Cardiologist:  Dr. Lovena Le   Chief Complaint  Patient presents with  . Follow-up     History of Present Illness: [name] is a 84 y.o. male who presents for the evaluation of shortness of breath, seen for Dr. Lovena Le  [name] has a past medical history of chronic systolic CHF per echocardiogram 01/2018 with EF at 65 to 50%, CAD (last PTCA to mid and distal RCA for in-stent restenosis 10/2000) and hypertension who was last seen by [name], PA on 06/27/2019 after being seen by his PCP for shortness of breath.  On their visit, he stated he was doing well and that his shortness of breath was chronic in nature with no recent exacerbations.  He did report a dry cough for several months for which he was negative for Covid x2.  He is active at baseline however reports he has shortness of breath while walking around the grocery store and needs to take a break at times.  He has a 20-year history of tobacco use however has been quit for quite some time.  He had no anginal symptoms at last follow-up.  Plan at that time was to repeat his echocardiogram.  Prior echocardiogram 02/02/2018 with LVEF 45 to 50% with diffuse hypokinesis and grade 2 DD. His Lopressor was transitioned to bisoprolol 5 mg daily given COPD.  Unfortunately, the echocardiogram performed 07/11/2019 with LVEF noted to be 25 to 30% with global hypokinesis with inferior lateral akinesis; overall severe LV dysfunction, restrictive filling, mild LVE, mild AI and MR with mild LAE, moderate TR and severe pulmonary hypertension.  Today [name] is here with his wife.  He explains in depth that his symptoms are not necessarily life altering at this time.  He denies chest pain.  He reports he continues to play with his grandkids, work on cars, rake leaves and chop wood.  He states that he  knows his limitations and when he gets tired, he will take a break.  He has had no anginal symptoms.  He has no fluid volume overload symptoms with LE edema, orthopnea or weight gain.  Overall he is very stable.  I discussed the options in depth for proceeding with cardiac catheterization versus treatment with medical management and he would like Dr. Tanna Furry opinion on proceeding with LHC.  We will plan close follow-up and obtain lab work today.   Past Medical History:  Diagnosis Date  . BPH (benign prostatic hyperplasia)   . Coronary artery disease cardiologist--- dr Carleene Overlie taylor--- hx two MI's and total 3 BMSs   dx MI 1996 w/ cardiac cath stent proxRCA;   NSTEMI  04-23-2000  cardiac cath w/ PCI and stenting to distalRCA and mid RCA;   10-27-2000 cardiac cath w/ PCI cutting balloon angioplasty for in-stent restenosis dRCA and mRCA (non-overlapping stents);  03-14-2006  cardiac cath w/ no intervention , nonsobstructive   . Dyslipidemia   . Foley catheter in place    02-15-2019  per pt wife going on two weeks no urine goes in bag , so pt's sits on toliet and urinates around catheter,  states pt is not complainging of abd. pain or no emptying bladder  . Full dentures   . History of GI bleed    07/ 2002   per discharge note , EGD ulceration/ NG tube  injury,  blood transfusion  . History of kidney injury    10-26-2018 due to obstruction--- resolved   . Hydrocele, bilateral   . Hypertension   . Memory impairment   . PAC (premature atrial contraction)   . RBBB (right bundle branch block)   . Skin lesions, generalized   . Urinary retention   . Wears glasses     Past Surgical History:  Procedure Laterality Date  . APPENDECTOMY  child  . CARDIAC CATHETERIZATION  03-14-2006   dr g. taylor   nonobtructive cad 70% mLAD, 50% dLAD, 70% ostial D1, irregularities ti CFx,  20% narrowing in mid & distal RCA stent 40% very distal RCA narrowing,  inferoapical wall akinesis w/ ef 55%  . CHOLECYSTECTOMY  OPEN  1970s  . CORONARY ANGIOPLASTY  10-28-2018   dr g. taylor   cutting balloon angioplasty to in-stent restenosis of dRCA and mRCA  . CORONARY ANGIOPLASTY WITH STENT PLACEMENT  1996   PTCA w/ stenting to proximal RCA  . CORONARY ANGIOPLASTY WITH STENT PLACEMENT  04-23-2000   dr g. taylor   PCI w/ stenting to distal RCA and mid RCA  (non-overlapping)  . INGUINAL HERNIA REPAIR  1980s   unilateral, unsure which side  . TONSILLECTOMY  child  . TRANSURETHRAL RESECTION OF PROSTATE N/A 02/16/2019   Procedure: TRANSURETHRAL RESECTION OF THE PROSTATE (TURP), BIPOLAR;  Surgeon: Ceasar Mons, MD;  Location: [facility];  Service: Urology;  Laterality: N/A;     Current Outpatient Medications  Medication Sig Dispense Refill  . aspirin EC 81 MG tablet Take 81 mg by mouth daily.    . bisoprolol (ZEBETA) 5 MG tablet Take 1 tablet (5 mg total) by mouth daily. 90 tablet 3  . lisinopril (ZESTRIL) 5 MG tablet Take 1 tablet (5 mg total) by mouth daily. 90 tablet 3  . simvastatin (ZOCOR) 10 MG tablet Take 1 tablet (10 mg total) by mouth at bedtime. 90 tablet 3   No current facility-administered medications for this visit.    Allergies:   Patient has no known allergies.    Social History:  The patient  reports that he quit smoking about 26 years ago. He has a 30.00 pack-year smoking history. He has never used smokeless tobacco. He reports that he does not drink alcohol or use drugs.   Family History:  The patient's family history includes Asthma in his mother.    ROS:  Please see the history of present illness.   Otherwise, review of systems are positive for none. All other systems are reviewed and negative.    PHYSICAL EXAM: VS:  There were no vitals taken for this visit. , BMI There is no height or weight on file to calculate BMI.  General: Elderly, NAD Skin: Warm, dry, intact  Neck: Negative for carotid bruits. No JVD Lungs:Clear to ausculation bilaterally. No  wheezes, rales, or rhonchi. Breathing is unlabored. Cardiovascular: RRR with S1 S2. No murmurs Extremities: No edema. Radial pulses 2+ bilaterally Neuro: Alert and oriented. No focal deficits. No facial asymmetry. MAE spontaneously. Psych: Responds to questions appropriately with normal affect.    EKG:  EKG is not ordered today.   Recent Labs: 10/27/2018: Magnesium 2.2 02/18/2019: Hemoglobin 11.8; Platelets 111 05/09/2019: ALT 10; BUN 16; Creatinine, Ser 0.81; Potassium 5.0; Sodium 140    Lipid Panel    Component Value Date/Time   CHOL 112 05/09/2019 1151   TRIG 97.0 05/09/2019 1151   HDL 44.10 05/09/2019 1151   CHOLHDL  3 05/09/2019 1151   VLDL 19.4 05/09/2019 1151   LDLCALC 49 05/09/2019 1151     Wt Readings from Last 3 Encounters:  06/27/19 111 lb 6.4 oz (50.5 kg)  06/22/19 110 lb (49.9 kg)  05/09/19 111 lb (50.3 kg)    Other studies Reviewed: Additional studies/ records that were reviewed today include:  Review of the above records demonstrates:   Cardiac catheterization 10/27/2000:  PROCEDURES PERFORMED: 1. Percutaneous transluminal coronary angioplasty utilizing the cutting    balloon of the mid right coronary artery for in-stent restenosis. 2. Percutaneous transluminal coronary angioplasty utilizing the cutting    balloon of the distal right coronary artery for in-stent restenosis. 3. Right heart catheterization.   RESULTS:  Successful percutaneous transluminal coronary angioplasty utilizing the cutting balloon of the mid and distal right coronary artery for in-stent restenosis.  A 99% stenosis with TIMI-2 flow in the mid vessel was reduced to 0% residual with TIMI-3 flow.  A diffuse 99% stenosis in the stent in the distal vessel was reduced to less than 20% residual with TIMI-3 flow.  The area beyond the stent that was 50% stenosed was reduced to approximately 25% residual with TIMI-3 flow.  Echocardiogram 02/02/2018:  Study Conclusions   - Left ventricle:  The cavity size was normal. Wall thickness was  increased in a pattern of mild LVH. Systolic function was mildly  reduced. The estimated ejection fraction was in the range of 45%  to 50%. Diffuse hypokinesis. Features are consistent with a  pseudonormal left ventricular filling pattern, with concomitant  abnormal relaxation and increased filling pressure (grade 2  diastolic dysfunction).  - Mitral valve: There was mild to moderate regurgitation.  - Left atrium: The atrium was mildly dilated.  - Pulmonary arteries: Systolic pressure was mildly increased. PA  peak pressure: 33 mm Hg (S).    Echocardiogram 07/11/2019:     1. Global hypokinesis with inferolateral akinesis; overall severe LV  dysfunction; restrictive filling; mild LVE; mild AI and MR; mild LAE; mild  RV dysfunction; moderate TR; severe pulmonary hypertension.  2. Left ventricular ejection fraction, by estimation, is 25 to 30%. The  left ventricle has severely decreased function. The left ventricle  demonstrates regional wall motion abnormalities (see scoring  diagram/findings for description). The left  ventricular internal cavity size was mildly dilated. Left ventricular  diastolic parameters are consistent with Grade III diastolic dysfunction  (restrictive). Elevated left atrial pressure.  3. Right ventricular systolic function is mildly reduced. The right  ventricular size is normal. There is severely elevated pulmonary artery  systolic pressure.  4. Left atrial size was mildly dilated.  5. The mitral valve is normal in structure. Mild mitral valve  regurgitation. No evidence of mitral stenosis.  6. Tricuspid valve regurgitation is moderate.  7. The aortic valve is tricuspid. Aortic valve regurgitation is mild.  Mild aortic valve sclerosis is present, with no evidence of aortic valve  stenosis.  8. The inferior vena cava is normal in size with <50% respiratory  variability, suggesting right  atrial pressure of 8 mmHg.   ASSESSMENT AND PLAN:  1. Chronic systolic CHF: -Prior echocardiogram from 01/2018 with LVEF at 45 to 50%>>> repeat echo performed 07/11/2019 secondary to [place] with newly reduced LV function at 25 to 30% with global hypokinesis with inferior lateral akinesis, overall severe LV dysfunction, restrictive filling with grade 3 DD, mild LVE, mild AI and MR with mild LAE, moderate TR and severe pulmonary hypertension -Last cardiac catheterization in 2002 with PTCA  to mid and distal RCA secondary to in-stent restenosis.  He has had no further ischemic evaluation since that time -Initial plan was for [facility] for further ischemic work-up however patient has deferred at this time until obtaining Dr. Forde Dandy opinion on this matter.  He reports his symptoms are not limiting at this time. -Denies anginal symptoms -Appears euvolemic on exam -Lopressor was transitioned to bisoprolol 5 mg at last OV given history of COPD -Lisinopril was increased to 10 mg (as BP allowed) -Pain lab work today  2. CAD -As above, history of RCA stenting with last cardiac catheterization 10/2000 with PTCA to mid and distal RCA for in-stent restenosis  -Prior echocardiogram with EF at 45 to 50% now with newly reduced LV function in the setting of  more recent DOE.   -Continue ASA, bisoprolol, statin   3. HTN -Stable, 128/76 -Continue bisoprolol, lisinopril  -Lisinopril increased to 10 mg p.o. daily.  Uptitrate as tolerated   4. SOB -Initially felt to be multifactorial given lower extremity tobacco use with COPD however further work-up with repeat echocardiogram 06/2019 now with newly reduced LV function at 25 to 30% along with severe pulmonary hypertension noted >>> initial plan was to proceed with [facility] for further evaluation however patient has deferred as he reports his symptoms are not limiting at this time  -He would like Dr. Tanna Furry input on this matter   Current medicines are reviewed at  length with the patient today.  The patient does not have concerns regarding medicines.  The following changes have been made: Lisinopril to 10 mg p.o. daily  Labs/ tests ordered today include: BMET, CBC No orders of the defined types were placed in this encounter.    Disposition:   FU with myself in 1 month  Signed, Kathyrn Drown, NP  08/01/2019 2:21 PM    [facility] [address], [place], [place]  [address] Phone: [phone]; Fax: [phone]

## 2019-08-01 ENCOUNTER — Ambulatory Visit: Payer: Medicare HMO | Admitting: Cardiology

## 2019-08-01 ENCOUNTER — Encounter: Payer: Self-pay | Admitting: Cardiology

## 2019-08-01 ENCOUNTER — Other Ambulatory Visit: Payer: Self-pay

## 2019-08-01 VITALS — BP 128/76 | HR 84 | Ht 64.0 in | Wt 111.6 lb

## 2019-08-01 DIAGNOSIS — I259 Chronic ischemic heart disease, unspecified: Secondary | ICD-10-CM

## 2019-08-01 DIAGNOSIS — I25119 Atherosclerotic heart disease of native coronary artery with unspecified angina pectoris: Secondary | ICD-10-CM

## 2019-08-01 DIAGNOSIS — I10 Essential (primary) hypertension: Secondary | ICD-10-CM | POA: Diagnosis not present

## 2019-08-01 DIAGNOSIS — I5022 Chronic systolic (congestive) heart failure: Secondary | ICD-10-CM | POA: Diagnosis not present

## 2019-08-01 MED ORDER — LISINOPRIL 10 MG PO TABS: 10.0000 mg | ORAL_TABLET | Freq: Every day | ORAL | 6 refills | Status: AC

## 2019-08-01 NOTE — Patient Instructions (Signed)
Medication Instructions:  Your physician has recommended you make the following change in your medication:   1) Increase Lisinopril to 10 mg, 1 tablet by mouth once a day  *If you need a refill on your cardiac medications before your next appointment, please call your pharmacy*  Lab Work:  You will have labs drawn today: BMET/CBC  If you have labs (blood work) drawn today and your tests are completely normal, you will receive your results only by: Marland Kitchen MyChart Message (if you have MyChart) OR . A paper copy in the mail If you have any lab test that is abnormal or we need to change your treatment, we will call you to review the results.  Testing/Procedures:  None ordered today  Follow-Up: At [facility], you and your health needs are our priority.  As part of our continuing mission to provide you with exceptional heart care, we have created designated Provider Care Teams.  These Care Teams include your primary Cardiologist (physician) and Advanced Practice Providers (APPs -  Physician Assistants and Nurse Practitioners) who all work together to provide you with the care you need, when you need it.  We recommend signing up for the patient portal called "MyChart".  Sign up information is provided on this After Visit Summary.  MyChart is used to connect with patients for Virtual Visits (Telemedicine).  Patients are able to view lab/test results, encounter notes, upcoming appointments, etc.  Non-urgent messages can be sent to your provider as well.   To learn more about what you can do with MyChart, go to [email or web address].    Your next appointment:    On 09/13/19 at 2:45PM with Kathyrn Drown, NP

## 2019-08-02 ENCOUNTER — Other Ambulatory Visit: Payer: Self-pay | Admitting: Cardiology

## 2019-08-02 LAB — BASIC METABOLIC PANEL
BUN/Creatinine Ratio: 18 (ref 10–24)
BUN: 16 mg/dL (ref 8–27)
CO2: 23 mmol/L (ref 20–29)
Calcium: 9.9 mg/dL (ref 8.6–10.2)
Chloride: 106 mmol/L (ref 96–106)
Creatinine, Ser: 0.89 mg/dL (ref 0.76–1.27)
GFR calc Af Amer: 93 mL/min/{1.73_m2} (ref >59)
GFR calc non Af Amer: 80 mL/min/{1.73_m2} (ref >59)
Glucose: 108 mg/dL — ABNORMAL HIGH (ref 65–99)
Potassium: 4.7 mmol/L (ref 3.5–5.2)
Sodium: 146 mmol/L — ABNORMAL HIGH (ref 134–144)

## 2019-08-02 LAB — CBC
Hematocrit: 45 % (ref 37.5–51.0)
Hemoglobin: 14.6 g/dL (ref 13.0–17.7)
MCH: 26.5 pg — ABNORMAL LOW (ref 26.6–33.0)
MCHC: 32.4 g/dL (ref 31.5–35.7)
MCV: 82 fL (ref 79–97)
Platelets: 260 10*3/uL (ref 150–450)
RBC: 5.5 x10E6/uL (ref 4.14–5.80)
RDW: 15.8 % — ABNORMAL HIGH (ref 11.6–15.4)
WBC: 9.1 10*3/uL (ref 3.4–10.8)

## 2019-09-01 NOTE — Progress Notes (Signed)
Cardiology Office Note   Date:  09/13/2019   ID:  [name], DOB 14-Jan-1936, MRN [id number]  PCP:  Steve Koch, NP  Cardiologist:  Dr. Lovena Le, MD   Chief Complaint  Patient presents with  . Follow-up     History of Present Illness: [name] is a 84 y.o. male who presents for 4-week follow-up, seen by Dr. Lovena [name].  [name] has a past medical history of chronic systolic CHF per echocardiogram 01/2018 with EF at 83 to 50%, CAD (last PTCA to mid and distal RCA for in-stent restenosis 10/2000) and hypertension who was last seen by Steve Kilts, PA on 06/27/2019 after being seen by his PCP for shortness of breath.  On their visit, he stated he was doing well and that his shortness of breath was chronic in nature with no recent exacerbations.  He did report a dry cough for several months for which he was negative for Covid x2. More recently he was having exertional dyspnea.   Given this, plan was to repeat his echocardiogram. Prior echocardiogram 02/02/2018 with LVEF 45 to 50% with diffuse hypokinesis and grade 2 DD. His Lopressor was transitioned to bisoprolol 5 mg daily given COPD.  Unfortunately, the echocardiogram performed 07/11/2019 with LVEF noted to be 25 to 30% with global hypokinesis with inferior lateral akinesis; overall severe LV dysfunction, restrictive filling, mild LVE, mild AI and MR with mild LAE, moderate TR and severe pulmonary hypertension.  He was last seen by myself at which time he explained in depth that his symptoms are not necessarily life altering at that time. He reported he continued to play with his grandkids, work on cars, rake leaves and chop wood.  He stated that he knows his limitations and when he gets tired, he will take a break. He had no anginal symptoms and no fluid volume overload.   Today he is here with mild [name] edema and reports one isolated episode of chest pain which occurred while watching TV and chart.  He had no  associated and pain dissipated on its own. He cannot remember the last time he had chest pain. We discussed the options in depth for proceeding with cardiac catheterization versus treatment with medical management and he would like Dr. Tanna [name] opinion on proceeding with LHC.  Given [name] edema, will add low dose lasix and follow.    Past Medical History:  Diagnosis Date  . BPH (benign prostatic hyperplasia)   . Coronary artery disease cardiologist--- dr [name]--- hx two MI's and total 3 BMSs   dx MI 1996 w/ cardiac cath stent proxRCA;   NSTEMI  04-23-2000  cardiac cath w/ PCI and stenting to distalRCA and mid RCA;   10-27-2000 cardiac cath w/ PCI cutting balloon angioplasty for in-stent restenosis dRCA and mRCA (non-overlapping stents);  03-14-2006  cardiac cath w/ no intervention , nonsobstructive   . Dyslipidemia   . Foley catheter in place    02-15-2019  per pt wife going on two weeks no urine goes in bag , so pt's sits on toliet and urinates around catheter,  states pt is not complainging of abd. pain or no emptying bladder  . Full dentures   . History of GI bleed    07/ 2002   per discharge note , EGD ulceration/ NG tube injury,  blood transfusion  . History of kidney injury    10-26-2018 due to obstruction--- resolved   . Hydrocele, bilateral   . Hypertension   .  Memory impairment   . PAC (premature atrial contraction)   . RBBB (right bundle branch block)   . Skin lesions, generalized   . Urinary retention   . Wears glasses     Past Surgical History:  Procedure Laterality Date  . APPENDECTOMY  child  . CARDIAC CATHETERIZATION  03-14-2006   dr g. [name]   nonobtructive cad 70% mLAD, 50% dLAD, 70% ostial D1, irregularities ti CFx,  20% narrowing in mid & distal RCA stent 40% very distal RCA narrowing,  inferoapical wall akinesis w/ ef 55%  . CHOLECYSTECTOMY OPEN  1970s  . CORONARY ANGIOPLASTY  10-28-2018   dr g. [name]   cutting balloon angioplasty to in-stent restenosis of  dRCA and mRCA  . CORONARY ANGIOPLASTY WITH STENT PLACEMENT  1996   PTCA w/ stenting to proximal RCA  . CORONARY ANGIOPLASTY WITH STENT PLACEMENT  04-23-2000   dr g. [name]   PCI w/ stenting to distal RCA and mid RCA  (non-overlapping)  . INGUINAL HERNIA REPAIR  1980s   unilateral, unsure which side  . TONSILLECTOMY  child  . TRANSURETHRAL RESECTION OF PROSTATE N/A 02/16/2019   Procedure: TRANSURETHRAL RESECTION OF THE PROSTATE (TURP), BIPOLAR;  Surgeon: Ceasar Mons, MD;  Location: [facility];  Service: Urology;  Laterality: N/A;     Current Outpatient Medications  Medication Sig Dispense Refill  . aspirin EC 81 MG tablet Take 81 mg by mouth daily.    . bisoprolol (ZEBETA) 5 MG tablet Take 1 tablet (5 mg total) by mouth daily. 90 tablet 3  . lisinopril (ZESTRIL) 10 MG tablet Take 1 tablet (10 mg total) by mouth daily. 30 tablet 6  . simvastatin (ZOCOR) 10 MG tablet Take 1 tablet (10 mg total) by mouth at bedtime. 90 tablet 3   No current facility-administered medications for this visit.    Allergies:   Patient has no known allergies.    Social History:  The patient  reports that he quit smoking about 26 years ago. He has a 30.00 pack-year smoking history. He has never used smokeless tobacco. He reports that he does not drink alcohol or use drugs.   Family History:  The patient's family history includes Asthma in his mother.    ROS:  Please see the history of present illness.   Otherwise, review of systems are positive for none. All other systems are reviewed and negative.    PHYSICAL EXAM: VS:  There were no vitals taken for this visit. , BMI There is no height or weight on file to calculate BMI.   General: Frail, elderly, NAD Lungs:Clear to ausculation bilaterally. No wheezes, rales, or rhonchi. Breathing is unlabored. Cardiovascular: RRR with S1 S2. + murmur Extremities: 2+ BLE edema. Radial DP/PT pulses 2+ bilaterally Neuro: Alert and oriented.  No focal deficits. No facial asymmetry. MAE spontaneously. Psych: Responds to questions appropriately with normal affect.     EKG:  EKG is not ordered today.   Recent Labs: 10/27/2018: Magnesium 2.2 05/09/2019: ALT 10 08/01/2019: BUN 16; Creatinine, Ser 0.89; Potassium 4.7; Sodium 146 08/02/2019: Hemoglobin 14.6; Platelets 260   Lipid Panel    Component Value Date/Time   CHOL 112 05/09/2019 1151   TRIG 97.0 05/09/2019 1151   HDL 44.10 05/09/2019 1151   CHOLHDL 3 05/09/2019 1151   VLDL 19.4 05/09/2019 1151   LDLCALC 49 05/09/2019 1151      Wt Readings from Last 3 Encounters:  08/01/19 111 lb 9.6 oz (50.6 kg)  06/27/19 111  lb 6.4 oz (50.5 kg)  06/22/19 110 lb (49.9 kg)     Other studies Reviewed: Additional studies/ records that were reviewed today include:  Cardiac catheterization 10/27/2000:  PROCEDURES PERFORMED: 1. Percutaneous transluminal coronary angioplasty utilizing the cutting balloon of the mid right coronary artery for in-stent restenosis. 2. Percutaneous transluminal coronary angioplasty utilizing the cutting balloon of the distal right coronary artery for in-stent restenosis. 3. Right heart catheterization.   RESULTS: Successful percutaneous transluminal coronary angioplasty utilizing the cutting balloon of the mid and distal right coronary artery for in-stent restenosis. A 99% stenosis with TIMI-2 flow in the mid vessel was reduced to 0% residual with TIMI-3 flow. A diffuse 99% stenosis in the stent in the distal vessel was reduced to less than 20% residual with TIMI-3 flow. The area beyond the stent that was 50% stenosed was reduced to approximately 25% residual with TIMI-3 flow.  Echocardiogram 02/02/2018:  Study Conclusions   - Left ventricle: The cavity size was normal. Wall thickness was  increased in a pattern of mild LVH. Systolic function was mildly  reduced. The estimated ejection fraction was in the range of 45%  to  50%. Diffuse hypokinesis. Features are consistent with a  pseudonormal left ventricular filling pattern, with concomitant  abnormal relaxation and increased filling pressure (grade 2  diastolic dysfunction).  - Mitral valve: There was mild to moderate regurgitation.  - Left atrium: The atrium was mildly dilated.  - Pulmonary arteries: Systolic pressure was mildly increased. PA  peak pressure: 33 mm Hg (S).    Echocardiogram 07/11/2019:     1. Global hypokinesis with inferolateral akinesis; overall severe LV  dysfunction; restrictive filling; mild LVE; mild AI and MR; mild LAE; mild  RV dysfunction; moderate TR; severe pulmonary hypertension.  2. Left ventricular ejection fraction, by estimation, is 25 to 30%. The  left ventricle has severely decreased function. The left ventricle  demonstrates regional wall motion abnormalities (see scoring  diagram/findings for description). The left  ventricular internal cavity size was mildly dilated. Left ventricular  diastolic parameters are consistent with Grade III diastolic dysfunction  (restrictive). Elevated left atrial pressure.  3. Right ventricular systolic function is mildly reduced. The right  ventricular size is normal. There is severely elevated pulmonary artery  systolic pressure.  4. Left atrial size was mildly dilated.  5. The mitral valve is normal in structure. Mild mitral valve  regurgitation. No evidence of mitral stenosis.  6. Tricuspid valve regurgitation is moderate.  7. The aortic valve is tricuspid. Aortic valve regurgitation is mild.  Mild aortic valve sclerosis is present, with no evidence of aortic valve  stenosis.  8. The inferior vena cava is normal in size with <50% respiratory  variability, suggesting right atrial pressure of 8 mmHg.    ASSESSMENT AND PLAN:  1.Chronic systolic CHF: -Prior echocardiogram from 01/2018 with LVEF at 45 to 50%>>> repeat echo performed 07/11/2019 secondary to  [place] with newly reduced LV function at 25 to 30% with global hypokinesis with inferior lateral akinesis, overall severe LV dysfunction, restrictive filling with grade 3 DD, mild LVE, mild AI and MR with mild LAE, moderate TR and severe pulmonary hypertension -Last cardiac catheterization in 2002 with PTCA to mid and distal RCA secondary to in-stent restenosis.  He has had no further ischemic evaluation since that time -Initial plan was for [facility] for further ischemic work-up however patient would like to discuss further with Dr. Lovena [name] -Will add Lasix 20mg  PO QD and follow response -Add SL NTG>>>discussed  administration  -Lopressor was transitioned to bisoprolol 5 mg at last OV given history of COPD -Lisinopril was increased to 10 mg (as BP allowed)  2.CAD: -As above, history of RCA stenting with last cardiac catheterization 10/2000 with PTCA to mid and distal RCA for in-stent restenosis  -Prior echocardiogram with EF at 45 to 50% now with newly reduced LV function in the setting of  more recent DOE -See plan for #1 -Continue ASA, bisoprolol, statin   3. HTN -Stable, 134/82 -Continue bisoprolol, lisinopril  -Lisinopril increased to 10 mg p.o. daily.    4. SOB -Initially felt to be multifactorial given lower extremity tobacco use with COPD however further work-up with repeat echocardiogram 06/2019 now with newly reduced LV function at 25 to 30% along with severe pulmonary hypertension noted >>> initial plan was to proceed with [facility] for further evaluation however patient deferred>>presents to follow up with [name] edema and one isolated episode of chest pain>>>message sent to Dr. Lovena [name] as the patient values his opinion on this matter    Current medicines are reviewed at length with the patient today.  The patient does not have concerns regarding medicines.  The following changes have been made:  Add Lasis 20mg  PO QD, SL NTG PRN   Labs/ tests ordered today include: BMET  No orders of the  defined types were placed in this encounter.    Disposition:   FU with Dr. Lovena [name]  in 8 weeks  Signed, Kathyrn Drown, NP  09/13/2019 3:00 PM    [address], [place], [place]  [address] Phone: [phone]; Fax: [phone]

## 2019-09-01 NOTE — H&P (View-Only) (Signed)
Cardiology Office Note   Date:  09/13/2019   ID:  [name], DOB 10/03/1935, MRN [id number]  PCP:  Steve Koch, NP  Cardiologist:  Dr. Lovena Le, MD   Chief Complaint  Patient presents with  . Follow-up     History of Present Illness: [name] is a 84 y.o. male who presents for 4-week follow-up, seen by Dr. Lovena Le.  [name] has a past medical history of chronic systolic CHF per echocardiogram 01/2018 with EF at 22 to 50%, CAD (last PTCA to mid and distal RCA for in-stent restenosis 10/2000) and hypertension who was last seen by Steve Kilts, PA on 06/27/2019 after being seen by his PCP for shortness of breath.  On their visit, he stated he was doing well and that his shortness of breath was chronic in nature with no recent exacerbations.  He did report a dry cough for several months for which he was negative for Covid x2. More recently he was having exertional dyspnea.   Given this, plan was to repeat his echocardiogram. Prior echocardiogram 02/02/2018 with LVEF 45 to 50% with diffuse hypokinesis and grade 2 DD. His Lopressor was transitioned to bisoprolol 5 mg daily given COPD.  Unfortunately, the echocardiogram performed 07/11/2019 with LVEF noted to be 25 to 30% with global hypokinesis with inferior lateral akinesis; overall severe LV dysfunction, restrictive filling, mild LVE, mild AI and MR with mild LAE, moderate TR and severe pulmonary hypertension.  He was last seen by myself at which time he explained in depth that his symptoms are not necessarily life altering at that time. He reported he continued to play with his grandkids, work on cars, rake leaves and chop wood.  He stated that he knows his limitations and when he gets tired, he will take a break. He had no anginal symptoms and no fluid volume overload.   Today he is here with mild LE edema and reports one isolated episode of chest pain which occurred while watching TV and chart.  He had no  associated and pain dissipated on its own. He cannot remember the last time he had chest pain. We discussed the options in depth for proceeding with cardiac catheterization versus treatment with medical management and he would like Dr. Tanna Furry opinion on proceeding with LHC.  Given LE edema, will add low dose lasix and follow.    Past Medical History:  Diagnosis Date  . BPH (benign prostatic hyperplasia)   . Coronary artery disease cardiologist--- dr Carleene Overlie taylor--- hx two MI's and total 3 BMSs   dx MI 1996 w/ cardiac cath stent proxRCA;   NSTEMI  04-23-2000  cardiac cath w/ PCI and stenting to distalRCA and mid RCA;   10-27-2000 cardiac cath w/ PCI cutting balloon angioplasty for in-stent restenosis dRCA and mRCA (non-overlapping stents);  03-14-2006  cardiac cath w/ no intervention , nonsobstructive   . Dyslipidemia   . Foley catheter in place    02-15-2019  per pt wife going on two weeks no urine goes in bag , so pt's sits on toliet and urinates around catheter,  states pt is not complainging of abd. pain or no emptying bladder  . Full dentures   . History of GI bleed    07/ 2002   per discharge note , EGD ulceration/ NG tube injury,  blood transfusion  . History of kidney injury    10-26-2018 due to obstruction--- resolved   . Hydrocele, bilateral   . Hypertension   .  Memory impairment   . PAC (premature atrial contraction)   . RBBB (right bundle branch block)   . Skin lesions, generalized   . Urinary retention   . Wears glasses     Past Surgical History:  Procedure Laterality Date  . APPENDECTOMY  child  . CARDIAC CATHETERIZATION  03-14-2006   dr g. taylor   nonobtructive cad 70% mLAD, 50% dLAD, 70% ostial D1, irregularities ti CFx,  20% narrowing in mid & distal RCA stent 40% very distal RCA narrowing,  inferoapical wall akinesis w/ ef 55%  . CHOLECYSTECTOMY OPEN  1970s  . CORONARY ANGIOPLASTY  10-28-2018   dr g. taylor   cutting balloon angioplasty to in-stent restenosis of  dRCA and mRCA  . CORONARY ANGIOPLASTY WITH STENT PLACEMENT  1996   PTCA w/ stenting to proximal RCA  . CORONARY ANGIOPLASTY WITH STENT PLACEMENT  04-23-2000   dr g. taylor   PCI w/ stenting to distal RCA and mid RCA  (non-overlapping)  . INGUINAL HERNIA REPAIR  1980s   unilateral, unsure which side  . TONSILLECTOMY  child  . TRANSURETHRAL RESECTION OF PROSTATE N/A 02/16/2019   Procedure: TRANSURETHRAL RESECTION OF THE PROSTATE (TURP), BIPOLAR;  Surgeon: Ceasar Mons, MD;  Location: [facility];  Service: Urology;  Laterality: N/A;     Current Outpatient Medications  Medication Sig Dispense Refill  . aspirin EC 81 MG tablet Take 81 mg by mouth daily.    . bisoprolol (ZEBETA) 5 MG tablet Take 1 tablet (5 mg total) by mouth daily. 90 tablet 3  . lisinopril (ZESTRIL) 10 MG tablet Take 1 tablet (10 mg total) by mouth daily. 30 tablet 6  . simvastatin (ZOCOR) 10 MG tablet Take 1 tablet (10 mg total) by mouth at bedtime. 90 tablet 3   No current facility-administered medications for this visit.    Allergies:   Patient has no known allergies.    Social History:  The patient  reports that he quit smoking about 26 years ago. He has a 30.00 pack-year smoking history. He has never used smokeless tobacco. He reports that he does not drink alcohol or use drugs.   Family History:  The patient's family history includes Asthma in his mother.    ROS:  Please see the history of present illness.   Otherwise, review of systems are positive for none. All other systems are reviewed and negative.    PHYSICAL EXAM: VS:  There were no vitals taken for this visit. , BMI There is no height or weight on file to calculate BMI.   General: Frail, elderly, NAD Lungs:Clear to ausculation bilaterally. No wheezes, rales, or rhonchi. Breathing is unlabored. Cardiovascular: RRR with S1 S2. + murmur Extremities: 2+ BLE edema. Radial DP/PT pulses 2+ bilaterally Neuro: Alert and oriented.  No focal deficits. No facial asymmetry. MAE spontaneously. Psych: Responds to questions appropriately with normal affect.     EKG:  EKG is not ordered today.   Recent Labs: 10/27/2018: Magnesium 2.2 05/09/2019: ALT 10 08/01/2019: BUN 16; Creatinine, Ser 0.89; Potassium 4.7; Sodium 146 08/02/2019: Hemoglobin 14.6; Platelets 260   Lipid Panel    Component Value Date/Time   CHOL 112 05/09/2019 1151   TRIG 97.0 05/09/2019 1151   HDL 44.10 05/09/2019 1151   CHOLHDL 3 05/09/2019 1151   VLDL 19.4 05/09/2019 1151   LDLCALC 49 05/09/2019 1151      Wt Readings from Last 3 Encounters:  08/01/19 111 lb 9.6 oz (50.6 kg)  06/27/19 111  lb 6.4 oz (50.5 kg)  06/22/19 110 lb (49.9 kg)     Other studies Reviewed: Additional studies/ records that were reviewed today include:  Cardiac catheterization 10/27/2000:  PROCEDURES PERFORMED: 1. Percutaneous transluminal coronary angioplasty utilizing the cutting balloon of the mid right coronary artery for in-stent restenosis. 2. Percutaneous transluminal coronary angioplasty utilizing the cutting balloon of the distal right coronary artery for in-stent restenosis. 3. Right heart catheterization.   RESULTS: Successful percutaneous transluminal coronary angioplasty utilizing the cutting balloon of the mid and distal right coronary artery for in-stent restenosis. A 99% stenosis with TIMI-2 flow in the mid vessel was reduced to 0% residual with TIMI-3 flow. A diffuse 99% stenosis in the stent in the distal vessel was reduced to less than 20% residual with TIMI-3 flow. The area beyond the stent that was 50% stenosed was reduced to approximately 25% residual with TIMI-3 flow.  Echocardiogram 02/02/2018:  Study Conclusions   - Left ventricle: The cavity size was normal. Wall thickness was  increased in a pattern of mild LVH. Systolic function was mildly  reduced. The estimated ejection fraction was in the range of 45%  to  50%. Diffuse hypokinesis. Features are consistent with a  pseudonormal left ventricular filling pattern, with concomitant  abnormal relaxation and increased filling pressure (grade 2  diastolic dysfunction).  - Mitral valve: There was mild to moderate regurgitation.  - Left atrium: The atrium was mildly dilated.  - Pulmonary arteries: Systolic pressure was mildly increased. PA  peak pressure: 33 mm Hg (S).    Echocardiogram 07/11/2019:     1. Global hypokinesis with inferolateral akinesis; overall severe LV  dysfunction; restrictive filling; mild LVE; mild AI and MR; mild LAE; mild  RV dysfunction; moderate TR; severe pulmonary hypertension.  2. Left ventricular ejection fraction, by estimation, is 25 to 30%. The  left ventricle has severely decreased function. The left ventricle  demonstrates regional wall motion abnormalities (see scoring  diagram/findings for description). The left  ventricular internal cavity size was mildly dilated. Left ventricular  diastolic parameters are consistent with Grade III diastolic dysfunction  (restrictive). Elevated left atrial pressure.  3. Right ventricular systolic function is mildly reduced. The right  ventricular size is normal. There is severely elevated pulmonary artery  systolic pressure.  4. Left atrial size was mildly dilated.  5. The mitral valve is normal in structure. Mild mitral valve  regurgitation. No evidence of mitral stenosis.  6. Tricuspid valve regurgitation is moderate.  7. The aortic valve is tricuspid. Aortic valve regurgitation is mild.  Mild aortic valve sclerosis is present, with no evidence of aortic valve  stenosis.  8. The inferior vena cava is normal in size with <50% respiratory  variability, suggesting right atrial pressure of 8 mmHg.    ASSESSMENT AND PLAN:  1.Chronic systolic CHF: -Prior echocardiogram from 01/2018 with LVEF at 45 to 50%>>> repeat echo performed 07/11/2019 secondary to  [place] with newly reduced LV function at 25 to 30% with global hypokinesis with inferior lateral akinesis, overall severe LV dysfunction, restrictive filling with grade 3 DD, mild LVE, mild AI and MR with mild LAE, moderate TR and severe pulmonary hypertension -Last cardiac catheterization in 2002 with PTCA to mid and distal RCA secondary to in-stent restenosis.  He has had no further ischemic evaluation since that time -Initial plan was for [facility] for further ischemic work-up however patient would like to discuss further with Dr. Lovena Le -Will add Lasix 20mg  PO QD and follow response -Add SL NTG>>>discussed  administration  -Lopressor was transitioned to bisoprolol 5 mg at last OV given history of COPD -Lisinopril was increased to 10 mg (as BP allowed)  2.CAD: -As above, history of RCA stenting with last cardiac catheterization 10/2000 with PTCA to mid and distal RCA for in-stent restenosis  -Prior echocardiogram with EF at 45 to 50% now with newly reduced LV function in the setting of  more recent DOE -See plan for #1 -Continue ASA, bisoprolol, statin   3. HTN -Stable, 134/82 -Continue bisoprolol, lisinopril  -Lisinopril increased to 10 mg p.o. daily.    4. SOB -Initially felt to be multifactorial given lower extremity tobacco use with COPD however further work-up with repeat echocardiogram 06/2019 now with newly reduced LV function at 25 to 30% along with severe pulmonary hypertension noted >>> initial plan was to proceed with [facility] for further evaluation however patient deferred>>presents to follow up with LE edema and one isolated episode of chest pain>>>message sent to Dr. Lovena Le as the patient values his opinion on this matter    Current medicines are reviewed at length with the patient today.  The patient does not have concerns regarding medicines.  The following changes have been made:  Add Lasis 20mg  PO QD, SL NTG PRN   Labs/ tests ordered today include: BMET  No orders of the  defined types were placed in this encounter.    Disposition:   FU with Dr. Lovena Le  in 8 weeks  Signed, Kathyrn Drown, NP  09/13/2019 3:00 PM    [address], [place], [place]  [address] Phone: [phone]; Fax: [phone]

## 2019-09-13 ENCOUNTER — Other Ambulatory Visit: Payer: Self-pay

## 2019-09-13 ENCOUNTER — Ambulatory Visit: Payer: Medicare HMO | Admitting: Cardiology

## 2019-09-13 ENCOUNTER — Encounter: Payer: Self-pay | Admitting: Cardiology

## 2019-09-13 VITALS — BP 134/82 | HR 79 | Ht 64.0 in | Wt 116.0 lb

## 2019-09-13 DIAGNOSIS — I25119 Atherosclerotic heart disease of native coronary artery with unspecified angina pectoris: Secondary | ICD-10-CM

## 2019-09-13 DIAGNOSIS — Z79899 Other long term (current) drug therapy: Secondary | ICD-10-CM

## 2019-09-13 DIAGNOSIS — E785 Hyperlipidemia, unspecified: Secondary | ICD-10-CM

## 2019-09-13 DIAGNOSIS — I259 Chronic ischemic heart disease, unspecified: Secondary | ICD-10-CM

## 2019-09-13 DIAGNOSIS — I10 Essential (primary) hypertension: Secondary | ICD-10-CM

## 2019-09-13 DIAGNOSIS — I5022 Chronic systolic (congestive) heart failure: Secondary | ICD-10-CM

## 2019-09-13 MED ORDER — NITROGLYCERIN 0.4 MG SL SUBL: 0.4000 mg | SUBLINGUAL_TABLET | SUBLINGUAL | 3 refills | Status: AC | PRN

## 2019-09-13 MED ORDER — FUROSEMIDE 20 MG PO TABS: 20.0000 mg | ORAL_TABLET | Freq: Every day | ORAL | 3 refills | Status: AC

## 2019-09-13 NOTE — Patient Instructions (Signed)
Medication Instructions:   Your physician has recommended you make the following change in your medication:   1) Start Lasix 20 mg, 1 tablet by mouth once a day 2) Start Nitroglycerin as needed for chest pain  *If you need a refill on your cardiac medications before your next appointment, please call your pharmacy*  Lab Work:  You will have labs drawn today: BMET  If you have labs (blood work) drawn today and your tests are completely normal, you will receive your results only by: Marland Kitchen MyChart Message (if you have MyChart) OR . A paper copy in the mail If you have any lab test that is abnormal or we need to change your treatment, we will call you to review the results.  Testing/Procedures:  None ordered today  Follow-Up:  On 11/08/19 at 10:45AM with Cristopher Peru, MD

## 2019-09-14 ENCOUNTER — Other Ambulatory Visit: Payer: Self-pay

## 2019-09-14 DIAGNOSIS — Z01812 Encounter for preprocedural laboratory examination: Secondary | ICD-10-CM

## 2019-09-14 LAB — BASIC METABOLIC PANEL
BUN/Creatinine Ratio: 18 (ref 10–24)
BUN: 20 mg/dL (ref 8–27)
CO2: 21 mmol/L (ref 20–29)
Calcium: 9.9 mg/dL (ref 8.6–10.2)
Chloride: 105 mmol/L (ref 96–106)
Creatinine, Ser: 1.09 mg/dL (ref 0.76–1.27)
GFR calc Af Amer: 73 mL/min/{1.73_m2} (ref >59)
GFR calc non Af Amer: 63 mL/min/{1.73_m2} (ref >59)
Glucose: 96 mg/dL (ref 65–99)
Potassium: 4.6 mmol/L (ref 3.5–5.2)
Sodium: 147 mmol/L — ABNORMAL HIGH (ref 134–144)

## 2019-09-19 ENCOUNTER — Ambulatory Visit (INDEPENDENT_AMBULATORY_CARE_PROVIDER_SITE_OTHER): Payer: Medicare HMO

## 2019-09-19 ENCOUNTER — Other Ambulatory Visit (HOSPITAL_COMMUNITY): Payer: Medicare HMO

## 2019-09-19 ENCOUNTER — Other Ambulatory Visit: Payer: Medicare HMO

## 2019-09-19 ENCOUNTER — Other Ambulatory Visit: Payer: Self-pay

## 2019-09-19 VITALS — BP 118/72 | HR 70 | Ht 64.0 in | Wt 108.0 lb

## 2019-09-19 DIAGNOSIS — I25119 Atherosclerotic heart disease of native coronary artery with unspecified angina pectoris: Secondary | ICD-10-CM

## 2019-09-19 DIAGNOSIS — Z01812 Encounter for preprocedural laboratory examination: Secondary | ICD-10-CM

## 2019-09-19 LAB — CBC
Hematocrit: 46.9 % (ref 37.5–51.0)
Hemoglobin: 15.5 g/dL (ref 13.0–17.7)
MCH: 26.5 pg — ABNORMAL LOW (ref 26.6–33.0)
MCHC: 33 g/dL (ref 31.5–35.7)
MCV: 80 fL (ref 79–97)
Platelets: 203 10*3/uL (ref 150–450)
RBC: 5.86 x10E6/uL — ABNORMAL HIGH (ref 4.14–5.80)
RDW: 18.2 % — ABNORMAL HIGH (ref 11.6–15.4)
WBC: 9.1 10*3/uL (ref 3.4–10.8)

## 2019-09-19 NOTE — Progress Notes (Signed)
1.) Reason for visit: EKG for cath 09/22/19  2.) Name of MD requesting visit: Kathyrn Drown, NP  3.) H&P: DOE, Chest Pain  4.) Assessment and plan per MD: Patient presents for a Nurse Visit EKG today for his cath scheduled for 09/22/19. Patient denies having any chest pain, SOB, or any other complaints at this time. EKG performed with no acute changes. Reviewed with Kathyrn Drown, NP. Instructed patient to continue with plan for cath on 09/22/19. Reviewed instruction letter with the patient. Also made them aware that covid testing is no longer required for cath. They were excited to hear the news.

## 2019-09-20 ENCOUNTER — Telehealth: Payer: Self-pay | Admitting: *Deleted

## 2019-09-20 NOTE — Telephone Encounter (Signed)
Pt contacted pre-catheterization scheduled at [facility] for: Thursday September 22, 2019 7:30 AM Verified arrival time and place: [facility]) at: 5:30 AM   No solid food after midnight prior to cath, clear liquids until 5 AM day of procedure.  Hold: Lasix-AM of procedure  Except hold medications  AM meds can be  taken pre-cath with sip of water including: ASA 81 mg   Confirmed patient has responsible adult to drive home post procedure and observe 24 hours after arriving home: yes  You are allowed ONE visitor in the waiting room during your procedure. Both you and your visitor must wear masks.      COVID-19 Pre-Screening Questions:  . In the past 7 to 10 days have you had a cough,  shortness of breath, headache, congestion, fever (100 or greater) body aches, chills, sore throat, or sudden loss of taste or sense of smell? no . Have you been around anyone with known Covid 19 in the past 7 to 10 days? no . Have you been around anyone who is awaiting Covid 19 test results in the past 7 to 10 days? no . Have you been around anyone who has mentioned symptoms of Covid 19 within the past 7 to 10 days? No  Reviewed procedure/mask/visitor instructions, COVID-19 screening questions with patient's wife (DPR).

## 2019-09-21 NOTE — Progress Notes (Addendum)
    [name] has a history of chronic systolic CHF per echocardiogram 01/2018 with EF of 40 to 45%, CAD with last PTCA to mid and distal RCA for in-stent restenosis 10/2000 and hypertension who was initially seen by myself 08/01/2019 for shortness of breath.  Repeat echocardiogram 07/11/2019 with reduced LV function to 25 to 30% with global hypokinesis, inferior lateral akinesis, restrictive filling, mild LVE, mild AI and MR with mild LAE, moderate TR and severe pulmonary hypertension.  Patient initially deferred further work-up given the absence of symptoms.  On follow-up 09/13/2019, the patient had one isolated episode of chest pain while watching TV.  Given this, he wished to discuss further evaluation with myself and Dr. Lovena Le.  After our visit, I personally messaged Dr. Lovena Le for his recommendations which were to pursue LHC for ischemic evaluation given newly reduced LV function and episode of chest pain. Personally called the patient that evening (6/1) and spoke with patient and wife who understood and agreed with the plan.  Risks and benefits of cardiac catheterization including but are not limited to stroke (1 in 1000), death (1 in 1000), kidney failure [usually temporary] (1 in 500), bleeding (1 in 200), allergic reaction [possibly serious] (1 in 200), and agrees to proceed were discussed during our office visit.  Addend: We will proceed with a right and left cardiac catheterization given [facility] Pager: [phone]

## 2019-09-22 ENCOUNTER — Ambulatory Visit (HOSPITAL_COMMUNITY)
Admission: RE | Admit: 2019-09-22 | Discharge: 2019-09-22 | Disposition: A | Discharge status: Home / Self Care | Payer: Medicare HMO | Attending: Cardiovascular Disease | Admitting: Cardiovascular Disease

## 2019-09-22 ENCOUNTER — Encounter (HOSPITAL_COMMUNITY)
Admission: RE | Disposition: A | Discharge status: Home / Self Care | Payer: Self-pay | Source: Home / Self Care | Attending: Cardiovascular Disease

## 2019-09-22 ENCOUNTER — Encounter (HOSPITAL_COMMUNITY): Payer: Self-pay | Admitting: Cardiovascular Disease

## 2019-09-22 ENCOUNTER — Other Ambulatory Visit: Payer: Self-pay

## 2019-09-22 DIAGNOSIS — R0602 Shortness of breath: Secondary | ICD-10-CM | POA: Diagnosis not present

## 2019-09-22 DIAGNOSIS — I2510 Atherosclerotic heart disease of native coronary artery without angina pectoris: Secondary | ICD-10-CM | POA: Diagnosis not present

## 2019-09-22 DIAGNOSIS — J449 Chronic obstructive pulmonary disease, unspecified: Secondary | ICD-10-CM | POA: Diagnosis not present

## 2019-09-22 DIAGNOSIS — Z87891 Personal history of nicotine dependence: Secondary | ICD-10-CM | POA: Insufficient documentation

## 2019-09-22 DIAGNOSIS — E785 Hyperlipidemia, unspecified: Secondary | ICD-10-CM | POA: Diagnosis not present

## 2019-09-22 DIAGNOSIS — Z7982 Long term (current) use of aspirin: Secondary | ICD-10-CM | POA: Diagnosis not present

## 2019-09-22 DIAGNOSIS — I4510 Unspecified right bundle-branch block: Secondary | ICD-10-CM | POA: Diagnosis not present

## 2019-09-22 DIAGNOSIS — I255 Ischemic cardiomyopathy: Secondary | ICD-10-CM | POA: Diagnosis not present

## 2019-09-22 DIAGNOSIS — I272 Pulmonary hypertension, unspecified: Secondary | ICD-10-CM | POA: Insufficient documentation

## 2019-09-22 DIAGNOSIS — I110 Hypertensive heart disease with heart failure: Secondary | ICD-10-CM | POA: Diagnosis not present

## 2019-09-22 DIAGNOSIS — N400 Enlarged prostate without lower urinary tract symptoms: Secondary | ICD-10-CM | POA: Diagnosis not present

## 2019-09-22 DIAGNOSIS — Z79899 Other long term (current) drug therapy: Secondary | ICD-10-CM | POA: Insufficient documentation

## 2019-09-22 DIAGNOSIS — Z955 Presence of coronary angioplasty implant and graft: Secondary | ICD-10-CM | POA: Insufficient documentation

## 2019-09-22 DIAGNOSIS — I5022 Chronic systolic (congestive) heart failure: Secondary | ICD-10-CM | POA: Diagnosis not present

## 2019-09-22 DIAGNOSIS — I252 Old myocardial infarction: Secondary | ICD-10-CM | POA: Diagnosis not present

## 2019-09-22 DIAGNOSIS — R079 Chest pain, unspecified: Secondary | ICD-10-CM | POA: Diagnosis present

## 2019-09-22 HISTORY — PX: RIGHT/LEFT HEART CATH AND CORONARY ANGIOGRAPHY: CATH118266

## 2019-09-22 LAB — POCT I-STAT EG7
Acid-Base Excess: 7 mmol/L — ABNORMAL HIGH (ref 0.0–2.0)
Bicarbonate: 34 mmol/L — ABNORMAL HIGH (ref 20.0–28.0)
Calcium, Ion: 1.2 mmol/L (ref 1.15–1.40)
HCT: 45 % (ref 39.0–52.0)
Hemoglobin: 15.3 g/dL (ref 13.0–17.0)
O2 Saturation: 65 %
Potassium: 3.2 mmol/L — ABNORMAL LOW (ref 3.5–5.1)
Sodium: 142 mmol/L (ref 135–145)
TCO2: 36 mmol/L — ABNORMAL HIGH (ref 22–32)
pCO2, Ven: 57.3 mmHg (ref 44.0–60.0)
pH, Ven: 7.382 (ref 7.250–7.430)
pO2, Ven: 35 mmHg (ref 32.0–45.0)

## 2019-09-22 LAB — POCT I-STAT 7, (LYTES, BLD GAS, ICA,H+H)
Acid-Base Excess: 4 mmol/L — ABNORMAL HIGH (ref 0.0–2.0)
Bicarbonate: 31 mmol/L — ABNORMAL HIGH (ref 20.0–28.0)
Calcium, Ion: 1.21 mmol/L (ref 1.15–1.40)
HCT: 46 % (ref 39.0–52.0)
Hemoglobin: 15.6 g/dL (ref 13.0–17.0)
O2 Saturation: 78 %
Potassium: 3.2 mmol/L — ABNORMAL LOW (ref 3.5–5.1)
Sodium: 142 mmol/L (ref 135–145)
TCO2: 33 mmol/L — ABNORMAL HIGH (ref 22–32)
pCO2 arterial: 51.7 mmHg — ABNORMAL HIGH (ref 32.0–48.0)
pH, Arterial: 7.385 (ref 7.350–7.450)
pO2, Arterial: 44 mmHg — ABNORMAL LOW (ref 83.0–108.0)

## 2019-09-22 SURGERY — RIGHT/LEFT HEART CATH AND CORONARY ANGIOGRAPHY
Anesthesia: LOCAL

## 2019-09-22 MED ORDER — FENTANYL CITRATE (PF) 100 MCG/2ML IJ SOLN
INTRAMUSCULAR | Status: AC
  Filled 2019-09-22: qty 2

## 2019-09-22 MED ORDER — HYDRALAZINE HCL 20 MG/ML IJ SOLN: 10.0000 mg | INTRAMUSCULAR | Status: DC | PRN

## 2019-09-22 MED ORDER — SODIUM CHLORIDE 0.9% FLUSH: 3.0000 mL | Freq: Two times a day (BID) | INTRAVENOUS | Status: DC

## 2019-09-22 MED ORDER — LIDOCAINE HCL (PF) 1 % IJ SOLN
INTRAMUSCULAR | Status: AC
  Filled 2019-09-22: qty 30

## 2019-09-22 MED ORDER — SODIUM CHLORIDE 0.9 % IV SOLN: INTRAVENOUS | Status: DC

## 2019-09-22 MED ORDER — HEPARIN (PORCINE) IN NACL 2000-0.9 UNIT/L-% IV SOLN
INTRAVENOUS | Status: AC
  Filled 2019-09-22: qty 1000

## 2019-09-22 MED ORDER — MIDAZOLAM HCL 2 MG/2ML IJ SOLN
INTRAMUSCULAR | Status: DC | PRN
  Administered 2019-09-22: 1 mg via INTRAVENOUS

## 2019-09-22 MED ORDER — VERAPAMIL HCL 2.5 MG/ML IV SOLN
INTRAVENOUS | Status: DC | PRN
  Administered 2019-09-22: 10 mL via INTRA_ARTERIAL

## 2019-09-22 MED ORDER — HEPARIN SODIUM (PORCINE) 1000 UNIT/ML IJ SOLN
INTRAMUSCULAR | Status: DC | PRN
  Administered 2019-09-22: 3000 [IU] via INTRAVENOUS

## 2019-09-22 MED ORDER — HEPARIN (PORCINE) IN NACL 1000-0.9 UT/500ML-% IV SOLN
INTRAVENOUS | Status: DC | PRN
  Administered 2019-09-22 (×2): 500 mL

## 2019-09-22 MED ORDER — HEPARIN (PORCINE) IN NACL 1000-0.9 UT/500ML-% IV SOLN
INTRAVENOUS | Status: AC
  Filled 2019-09-22: qty 1000

## 2019-09-22 MED ORDER — ACETAMINOPHEN 325 MG PO TABS: 650.0000 mg | ORAL_TABLET | ORAL | Status: DC | PRN

## 2019-09-22 MED ORDER — ONDANSETRON HCL 4 MG/2ML IJ SOLN: 4.0000 mg | Freq: Four times a day (QID) | INTRAMUSCULAR | Status: DC | PRN

## 2019-09-22 MED ORDER — IOHEXOL 350 MG/ML SOLN
INTRAVENOUS | Status: DC | PRN
  Administered 2019-09-22: 50 mL

## 2019-09-22 MED ORDER — LABETALOL HCL 5 MG/ML IV SOLN: 10.0000 mg | INTRAVENOUS | Status: DC | PRN

## 2019-09-22 MED ORDER — LIDOCAINE HCL (PF) 1 % IJ SOLN
INTRAMUSCULAR | Status: DC | PRN
  Administered 2019-09-22: 30 mL

## 2019-09-22 MED ORDER — MIDAZOLAM HCL 2 MG/2ML IJ SOLN
INTRAMUSCULAR | Status: AC
  Filled 2019-09-22: qty 2

## 2019-09-22 MED ORDER — ASPIRIN 81 MG PO CHEW
81.0000 mg | CHEWABLE_TABLET | ORAL | Status: DC
  Filled 2019-09-22: qty 1

## 2019-09-22 MED ORDER — FENTANYL CITRATE (PF) 100 MCG/2ML IJ SOLN
INTRAMUSCULAR | Status: DC | PRN
  Administered 2019-09-22: 25 ug via INTRAVENOUS

## 2019-09-22 MED ORDER — SODIUM CHLORIDE 0.9 % IV SOLN: 250.0000 mL | INTRAVENOUS | Status: DC | PRN

## 2019-09-22 MED ORDER — SODIUM CHLORIDE 0.9% FLUSH: 3.0000 mL | INTRAVENOUS | Status: DC | PRN

## 2019-09-22 SURGICAL SUPPLY — 16 items
CATH 5FR JL3.5 JR4 ANG PIG MP (CATHETERS) ×1 IMPLANT
CATH INFINITI 5 FR 3DRC (CATHETERS) ×1 IMPLANT
CATH SWAN GANZ 7F STRAIGHT (CATHETERS) ×1 IMPLANT
DEVICE RAD COMP TR BAND LRG (VASCULAR PRODUCTS) ×1 IMPLANT
GLIDESHEATH SLEND SS 6F .021 (SHEATH) ×1 IMPLANT
GUIDEWIRE INQWIRE 1.5J.035X260 (WIRE) IMPLANT
INQWIRE 1.5J .035X260CM (WIRE) ×2
KIT HEART LEFT (KITS) ×2 IMPLANT
PACK CARDIAC CATHETERIZATION (CUSTOM PROCEDURE TRAY) ×2 IMPLANT
SHEATH GLIDE SLENDER 4/5FR (SHEATH) ×1 IMPLANT
SHEATH PINNACLE 7F 10CM (SHEATH) ×1 IMPLANT
SHEATH PROBE COVER 6X72 (BAG) ×1 IMPLANT
SYR MEDRAD MARK 7 150ML (SYRINGE) ×2 IMPLANT
TRANSDUCER W/STOPCOCK (MISCELLANEOUS) ×2 IMPLANT
TUBING CIL FLEX 10 FLL-RA (TUBING) ×2 IMPLANT
WIRE EMERALD 3MM-J .025X260CM (WIRE) ×1 IMPLANT

## 2019-09-22 NOTE — Progress Notes (Signed)
Site area: right groin  Site Prior to Removal:  Level 0  Pressure Applied For 10 MINUTES    Minutes Beginning at 0900  Manual:   Yes.    Patient Status During Pull:  Stable   Post Pull Groin Site:  Level 0  Post Pull Instructions Given:  Yes.    Post Pull Pulses Present:  Yes.    Dressing Applied:  Yes.    Comments: bed rest started at 0910 X 2 hr

## 2019-09-22 NOTE — Interval H&P Note (Signed)
History and Physical Interval Note:  09/22/2019 7:21 AM  [name]  has presented today for surgery, with the diagnosis of chest pain.  The various methods of treatment have been discussed with the patient and family. After consideration of risks, benefits and other options for treatment, the patient has consented to  Procedure(s): RIGHT/LEFT HEART CATH AND CORONARY ANGIOGRAPHY (N/A) as a surgical intervention.  The patient's history has been reviewed, patient examined, no change in status, stable for surgery.  I have reviewed the patient's chart and labs.  Questions were answered to the patient's satisfaction.    Cath Lab Visit (complete for each Cath Lab visit)  Clinical Evaluation Leading to the Procedure:   ACS: No.  Non-ACS:    Anginal Classification: No Symptoms  Anti-ischemic medical therapy: Minimal Therapy (1 class of medications)  Non-Invasive Test Results: No non-invasive testing performed  Prior CABG: No previous CABG        Lauree Chandler

## 2019-09-22 NOTE — Discharge Instructions (Signed)
Drink plenty of fluids for 48 hours and keep wrist elevated at heart level for 24 hours  Radial Site Care   This sheet gives you information about how to care for yourself after your procedure. Your health care provider may also give you more specific instructions. If you have problems or questions, contact your health care provider. What can I expect after the procedure? After the procedure, it is common to have:  Bruising and tenderness at the catheter insertion area. Follow these instructions at home: Medicines  Take over-the-counter and prescription medicines only as told by your health care provider. Insertion site care 1. Follow instructions from your health care provider about how to take care of your insertion site. Make sure you: ? Wash your hands with soap and water before you change your bandage (dressing). If soap and water are not available, use hand sanitizer. ? Remove your dressing as told by your health care provider. In 24 hours 2. Check your insertion site every day for signs of infection. Check for: ? Redness, swelling, or pain. ? Fluid or blood. ? Pus or a bad smell. ? Warmth. 3. Do not take baths, swim, or use a hot tub until your health care provider approves. 4. You may shower 24-48 hours after the procedure, or as directed by your health care provider. ? Remove the dressing and gently wash the site with plain soap and water. ? Pat the area dry with a clean towel. ? Do not rub the site. That could cause bleeding. 5. Do not apply powder or lotion to the site. Activity   1. For 24 hours after the procedure, or as directed by your health care provider: ? Do not flex or bend the affected arm. ? Do not push or pull heavy objects with the affected arm. ? Do not drive yourself home from the hospital or clinic. You may drive 24 hours after the procedure unless your health care provider tells you not to. ? Do not operate machinery or power tools. 2. Do not lift  anything that is heavier than 5 lb, or the limit that you are told, until your health care provider says that it is safe.  For 4 days 3. Ask your health care provider when it is okay to: ? Return to work or school. ? Resume usual physical activities or sports. ? Resume sexual activity. General instructions  If the catheter site starts to bleed, raise your arm and put firm pressure on the site. If the bleeding does not stop, get help right away. This is a medical emergency.  If you went home on the same day as your procedure, a responsible adult should be with you for the first 24 hours after you arrive home.  Keep all follow-up visits as told by your health care provider. This is important. Contact a health care provider if:  You have a fever.  You have redness, swelling, or yellow drainage around your insertion site. Get help right away if:  You have unusual pain at the radial site.  The catheter insertion area swells very fast.  The insertion area is bleeding, and the bleeding does not stop when you hold steady pressure on the area.  Your arm or hand becomes pale, cool, tingly, or numb. These symptoms may represent a serious problem that is an emergency. Do not wait to see if the symptoms will go away. Get medical help right away. Call your local emergency services (911 in the [place] Do not drive  yourself to the hospital. Summary  After the procedure, it is common to have bruising and tenderness at the site.  Follow instructions from your health care provider about how to take care of your radial site wound. Check the wound every day for signs of infection.  Do not lift anything that is heavier than 5 lb, or the limit that you are told, until your health care provider says that it is safe. This information is not intended to replace advice given to you by your health care provider. Make sure you discuss any questions you have with your health care provider. Document Revised:  05/06/2017 Document Reviewed: 05/06/2017 Elsevier Patient Education  2020 [employer].  Femoral Site Care This sheet gives you information about how to care for yourself after your procedure. Your health care provider may also give you more specific instructions. If you have problems or questions, contact your health care provider. What can I expect after the procedure?  After the procedure, it is common to have:  Bruising that usually fades within 1-2 weeks.  Tenderness at the site. Follow these instructions at home: Wound care 6. Follow instructions from your health care provider about how to take care of your insertion site. Make sure you: ? Wash your hands with soap and water before you change your bandage (dressing). If soap and water are not available, use hand sanitizer. ? Remove your dressing as told by your health care provider. In 24 hours 7. Do not take baths, swim, or use a hot tub until your health care provider approves. 8. You may shower 24-48 hours after the procedure or as told by your health care provider. ? Gently wash the site with plain soap and water. ? Pat the area dry with a clean towel. ? Do not rub the site. This may cause bleeding. 9. Do not apply powder or lotion to the site. Keep the site clean and dry. 10. Check your femoral site every day for signs of infection. Check for: ? Redness, swelling, or pain. ? Fluid or blood. ? Warmth. ? Pus or a bad smell. Activity 4. For the first 2-3 days after your procedure, or as long as directed: ? Avoid climbing stairs as much as possible. ? Do not squat. 5. Do not lift anything that is heavier than 5 lb, or the limit that you are told, until your health care provider says that it is safe. For 5 days 6. Rest as directed. ? Avoid sitting for a long time without moving. Get up to take short walks every 1-2 hours. 7. Do not drive for 24 hours if you were given a medicine to help you relax (sedative). General  instructions  Take over-the-counter and prescription medicines only as told by your health care provider.  Keep all follow-up visits as told by your health care provider. This is important. Contact a health care provider if you have:  A fever or chills.  You have redness, swelling, or pain around your insertion site. Get help right away if:  The catheter insertion area swells very fast.  You pass out.  You suddenly start to sweat or your skin gets clammy.  The catheter insertion area is bleeding, and the bleeding does not stop when you hold steady pressure on the area. These symptoms may represent a serious problem that is an emergency. Do not wait to see if the symptoms will go away. Get medical help right away. Call your local emergency services (911 in the [place]  Do not drive yourself to the hospital. Summary  After the procedure, it is common to have bruising that usually fades within 1-2 weeks.  Check your femoral site every day for signs of infection.  Do not lift anything that is heavier than 5 lb, or the limit that you are told, until your health care provider says that it is safe. This information is not intended to replace advice given to you by your health care provider. Make sure you discuss any questions you have with your health care provider. Document Revised: 04/13/2017 Document Reviewed: 04/13/2017 Elsevier Patient Education  2020 [employer].

## 2019-09-23 LAB — POCT I-STAT 7, (LYTES, BLD GAS, ICA,H+H)
Acid-Base Excess: 7 mmol/L — ABNORMAL HIGH (ref 0.0–2.0)
Bicarbonate: 32.7 mmol/L — ABNORMAL HIGH (ref 20.0–28.0)
Calcium, Ion: 1.17 mmol/L (ref 1.15–1.40)
HCT: 45 % (ref 39.0–52.0)
Hemoglobin: 15.3 g/dL (ref 13.0–17.0)
O2 Saturation: 99 %
Potassium: 3.1 mmol/L — ABNORMAL LOW (ref 3.5–5.1)
Sodium: 142 mmol/L (ref 135–145)
TCO2: 34 mmol/L — ABNORMAL HIGH (ref 22–32)
pCO2 arterial: 49.9 mmHg — ABNORMAL HIGH (ref 32.0–48.0)
pH, Arterial: 7.424 (ref 7.350–7.450)
pO2, Arterial: 147 mmHg — ABNORMAL HIGH (ref 83.0–108.0)

## 2019-09-23 LAB — POCT ACTIVATED CLOTTING TIME: Activated Clotting Time: 164 seconds

## 2019-09-23 MED FILL — Heparin Sod (Porcine)-NaCl IV Soln 1000 Unit/500ML-0.9%: INTRAVENOUS | Qty: 500 | Status: AC

## 2019-10-23 DIAGNOSIS — G8929 Other chronic pain: Secondary | ICD-10-CM | POA: Diagnosis not present

## 2019-10-23 DIAGNOSIS — I10 Essential (primary) hypertension: Secondary | ICD-10-CM | POA: Diagnosis not present

## 2019-10-23 DIAGNOSIS — N529 Male erectile dysfunction, unspecified: Secondary | ICD-10-CM | POA: Diagnosis not present

## 2019-10-23 DIAGNOSIS — I739 Peripheral vascular disease, unspecified: Secondary | ICD-10-CM | POA: Diagnosis not present

## 2019-10-23 DIAGNOSIS — I252 Old myocardial infarction: Secondary | ICD-10-CM | POA: Diagnosis not present

## 2019-10-23 DIAGNOSIS — E785 Hyperlipidemia, unspecified: Secondary | ICD-10-CM | POA: Diagnosis not present

## 2019-10-23 DIAGNOSIS — R609 Edema, unspecified: Secondary | ICD-10-CM | POA: Diagnosis not present

## 2019-10-23 DIAGNOSIS — I25119 Atherosclerotic heart disease of native coronary artery with unspecified angina pectoris: Secondary | ICD-10-CM | POA: Diagnosis not present

## 2019-10-23 DIAGNOSIS — N400 Enlarged prostate without lower urinary tract symptoms: Secondary | ICD-10-CM | POA: Diagnosis not present

## 2019-10-23 DIAGNOSIS — I951 Orthostatic hypotension: Secondary | ICD-10-CM | POA: Diagnosis not present

## 2019-11-08 ENCOUNTER — Encounter: Payer: Self-pay | Admitting: Primary Care

## 2019-11-08 ENCOUNTER — Ambulatory Visit: Payer: Medicare HMO | Admitting: Internal Medicine

## 2019-11-08 ENCOUNTER — Ambulatory Visit (INDEPENDENT_AMBULATORY_CARE_PROVIDER_SITE_OTHER): Payer: Medicare HMO | Admitting: Primary Care

## 2019-11-08 ENCOUNTER — Other Ambulatory Visit: Payer: Self-pay

## 2019-11-08 ENCOUNTER — Encounter: Payer: Self-pay | Admitting: Internal Medicine

## 2019-11-08 VITALS — BP 128/82 | HR 71 | Temp 95.4°F

## 2019-11-08 VITALS — BP 130/76 | HR 70 | Ht 62.0 in | Wt 108.0 lb

## 2019-11-08 DIAGNOSIS — N3289 Other specified disorders of bladder: Secondary | ICD-10-CM

## 2019-11-08 DIAGNOSIS — I491 Atrial premature depolarization: Secondary | ICD-10-CM

## 2019-11-08 DIAGNOSIS — R4702 Dysphasia: Secondary | ICD-10-CM | POA: Diagnosis not present

## 2019-11-08 DIAGNOSIS — R7303 Prediabetes: Secondary | ICD-10-CM

## 2019-11-08 DIAGNOSIS — I25119 Atherosclerotic heart disease of native coronary artery with unspecified angina pectoris: Secondary | ICD-10-CM

## 2019-11-08 DIAGNOSIS — R4781 Slurred speech: Secondary | ICD-10-CM

## 2019-11-08 DIAGNOSIS — R296 Repeated falls: Secondary | ICD-10-CM | POA: Diagnosis not present

## 2019-11-08 DIAGNOSIS — I10 Essential (primary) hypertension: Secondary | ICD-10-CM

## 2019-11-08 DIAGNOSIS — E785 Hyperlipidemia, unspecified: Secondary | ICD-10-CM

## 2019-11-08 MED ORDER — ROSUVASTATIN CALCIUM 10 MG PO TABS: 10.0000 mg | ORAL_TABLET | Freq: Every day | ORAL | 3 refills | Status: AC

## 2019-11-08 NOTE — Assessment & Plan Note (Addendum)
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 Denies pain. Other than generalized weakness he doesn't seem to have fracture or dislocation from falls.  Also with CHF, decreased function since 2019, and CAD and is not a candidate for PCI. Medically managed.   We had a long discussion with patient and family regarding palliative care/hospice care, they will think and discuss.  Referral placed for home health PT for balance/falls prevention.

## 2019-11-08 NOTE — Progress Notes (Signed)
HPI [name] returns today for followup. He is a pleasant 84 yo man with CAD, s/p remote MI, who was found to have worsening CHF symptoms and LV dysfunction and underwent left heart cath where he was found to have moderate CAD but no focal stenosis over 60%. He has had some difficulty with his speech over the past few weeks.  No Known Allergies   Current Outpatient Medications  Medication Sig Dispense Refill  . acetaminophen (TYLENOL) 500 MG tablet Take 500 mg by mouth every 6 (six) hours as needed for moderate pain or headache.    Marland Kitchen aspirin EC 81 MG tablet Take 81 mg by mouth daily.    . bisoprolol (ZEBETA) 5 MG tablet Take 1 tablet (5 mg total) by mouth daily. 90 tablet 3  . furosemide (LASIX) 20 MG tablet Take 1 tablet (20 mg total) by mouth daily. 90 tablet 3  . lisinopril (ZESTRIL) 10 MG tablet Take 1 tablet (10 mg total) by mouth daily. 30 tablet 6  . nitroGLYCERIN (NITROSTAT) 0.4 MG SL tablet Place 1 tablet (0.4 mg total) under the tongue every 5 (five) minutes as needed for chest pain. 30 tablet 3  . rosuvastatin (CRESTOR) 10 MG tablet Take 1 tablet (10 mg total) by mouth daily. 90 tablet 3   No current facility-administered medications for this visit.     Past Medical History:  Diagnosis Date  . BPH (benign prostatic hyperplasia)   . Coronary artery disease cardiologist--- dr Carleene Overlie [name]--- hx two MI's and total 3 BMSs   dx MI 1996 w/ cardiac cath stent proxRCA;   NSTEMI  04-23-2000  cardiac cath w/ PCI and stenting to distalRCA and mid RCA;   10-27-2000 cardiac cath w/ PCI cutting balloon angioplasty for in-stent restenosis dRCA and mRCA (non-overlapping stents);  03-14-2006  cardiac cath w/ no intervention , nonsobstructive   . Dyslipidemia   . Foley catheter in place    02-15-2019  per pt wife going on two weeks no urine goes in bag , so pt's sits on toliet and urinates around catheter,  states pt is not complainging of abd. pain or no emptying bladder  . Full  dentures   . History of GI bleed    07/ 2002   per discharge note , EGD ulceration/ NG tube injury,  blood transfusion  . History of kidney injury    10-26-2018 due to obstruction--- resolved   . Hydrocele, bilateral   . Hypertension   . Memory impairment   . PAC (premature atrial contraction)   . RBBB (right bundle branch block)   . Skin lesions, generalized   . Urinary retention   . Wears glasses     ROS:   All systems reviewed and negative except as noted in the HPI.   Past Surgical History:  Procedure Laterality Date  . APPENDECTOMY  child  . CARDIAC CATHETERIZATION  03-14-2006   dr [name]   nonobtructive cad 70% mLAD, 50% dLAD, 70% ostial D1, irregularities ti CFx,  20% narrowing in mid & distal RCA stent 40% very distal RCA narrowing,  inferoapical wall akinesis w/ ef 55%  . CHOLECYSTECTOMY OPEN  1970s  . CORONARY ANGIOPLASTY  10-28-2018   dr [name]   cutting balloon angioplasty to in-stent restenosis of dRCA and mRCA  . CORONARY ANGIOPLASTY WITH STENT PLACEMENT  1996   PTCA w/ stenting to proximal RCA  . CORONARY ANGIOPLASTY WITH STENT PLACEMENT  04-23-2000   dr g.  [name]   PCI w/ stenting to distal RCA and mid RCA  (non-overlapping)  . INGUINAL HERNIA REPAIR  1980s   unilateral, unsure which side  . RIGHT/LEFT HEART CATH AND CORONARY ANGIOGRAPHY N/A 09/22/2019   Procedure: RIGHT/LEFT HEART CATH AND CORONARY ANGIOGRAPHY;  Surgeon: Steve Blanks, MD;  Location: [facility];  Service: Cardiovascular;  Laterality: N/A;  . TONSILLECTOMY  child  . TRANSURETHRAL RESECTION OF PROSTATE N/A 02/16/2019   Procedure: TRANSURETHRAL RESECTION OF THE PROSTATE (TURP), BIPOLAR;  Surgeon: Steve Mons, MD;  Location: [facility];  Service: Urology;  Laterality: N/A;     Family History  Problem Relation Age of Onset  . Asthma Mother      Social History   Socioeconomic History  . Marital status: Married    Spouse name: [name]  .  Number of children: 6  . Years of education: 5  . Highest education level: 8th grade  Occupational History  . Occupation: retired Dealer  Tobacco Use  . Smoking status: Former Smoker    Packs/day: 1.00    Years: 30.00    Pack years: 30.00    Quit date: 04/14/1993    Years since quitting: 26.5  . Smokeless tobacco: Never Used  Vaping Use  . Vaping Use: Never used  Substance and Sexual Activity  . Alcohol use: No  . Drug use: Never  . Sexual activity: Not Currently    Birth control/protection: None  Other Topics Concern  . Not on file  Social History Narrative   Married.   6 children, 12 grandchildren, 12 great grandchildren.   Once worked as a Games developer.   Enjoys relaxing, spending time with family.   Social Determinants of Health   Financial Resource Strain:   . Difficulty of Paying Living Expenses:   Food Insecurity:   . Worried About Charity fundraiser in the Last Year:   . Arboriculturist in the Last Year:   Transportation Needs:   . Film/video editor (Medical):   Marland Kitchen Lack of Transportation (Non-Medical):   Physical Activity:   . Days of Exercise per Week:   . Minutes of Exercise per Session:   Stress:   . Feeling of Stress :   Social Connections:   . Frequency of Communication with Friends and Family:   . Frequency of Social Gatherings with Friends and Family:   . Attends Religious Services:   . Active Member of Clubs or Organizations:   . Attends Archivist Meetings:   Marland Kitchen Marital Status:   Intimate Partner Violence:   . Fear of Current or Ex-Partner:   . Emotionally Abused:   Marland Kitchen Physically Abused:   . Sexually Abused:      BP (!) 130/76   Pulse 70   Ht 5\' 2"  (1.575 m)   Wt 108 lb (49 kg)   SpO2 92%   BMI 19.75 kg/m   Physical Exam:  Well appearing NAD HEENT: Unremarkable Neck:  No JVD, no thyromegally Lymphatics:  No adenopathy Back:  No CVA tenderness Lungs:  Clear with no wheezes HEART:  Regular rate rhythm, no murmurs, no  rubs, no clicks Abd:  soft, positive bowel sounds, no organomegally, no rebound, no guarding Ext:  2 plus pulses, no edema, no cyanosis, no clubbing Skin:  No rashes no nodules Neuro:  CN II through XII intact, motor grossly intact except his speech is slurred  EKG - nsr with RBBB   Assess/Plan: 1. CAD -  he is s/p remote MI. He had a heart cath which demonstrated no tight lesions. He has 3 vessel disease but no severe stenosis. 2. Dyslipidemia - we will try to aggressively reduce his cholesterol by switching from zocor to crestor 10 mg daily. 3. Chronic systolic heart failure - his symptoms are low output more than pulmonary edema. We will hold off on additional treatment for now. 4. RBBB - this has not progressed in over 20 years.  We will follow. 5. Slurred speech - his family notes that this has been present for 2-3 weeks. We will check a neuro MRI.  Mikle Bosworth.D.

## 2019-11-08 NOTE — Assessment & Plan Note (Signed)
Underwent TURP in November 2020, following with Urology, due in Fall 2021.

## 2019-11-08 NOTE — Progress Notes (Signed)
Subjective:    Patient ID: [name], male    DOB: 1935-10-06, 84 y.o.   MRN: [id number]  HPI  This visit occurred during the SARS-CoV-2 public health emergency.  Safety protocols were in place, including screening questions prior to the visit, additional usage of staff PPE, and extensive cleaning of exam room while observing appropriate contact time as indicated for disinfecting solutions.   [name] is an 84 year old male with a history of hypertension, chronic ischemic heart disease, CAD, GERD, AKI, hyperlipidemia, recurrent falls, bladder mass, prediabetes who presents today with his family with a chief complaint of recurrent falls and weakness.   Recurrent falls and has fallen about four times in the month of July 2021. Also with generalized weakness, urinary incontinence, slurred speech, left sided weakness that began around the same time. He is drinking water, but he's not eating much, and is sleeping more. He is mostly non ambulatory, wife is having trouble taking care of his needs.   History of gross hematuria with findings of possible bladder mass/blood clot/prostate mass involving bladder on CT renal stone study from July 2020. Urology evaluation was recommended. He was evaluated by Urology, underwent TURP in November 2020, due for follow up this Fall.   He saw cardiology this morning who changed his simvastatin to rosuvastatin 10 mg and ordered an MRI of the brain for symptoms of slurred speech and weakness. Echocardiogram in March 2021 with LVEF of 25 to 30% with global hypokinesis with inferior lateral akinesis, severe LV dysfunction. Moderate tricuspid regurgitation, severe pulmonary hypertension. This is an overall reduction in heart function compared to echocardiogram from October 2019.  He underwent cardiac catheterization in June 2021 which showed 50-60% stenosis bilaterally, was not a candidate for PCI. Plan is to treat medically.   BP Readings from Last 3  Encounters:  11/08/19 128/82  11/08/19 (!) 130/76  09/22/19 (!) 146/84     Review of Systems  Constitutional: Positive for activity change, appetite change and fatigue.  Eyes: Negative for visual disturbance.  Cardiovascular: Negative for chest pain.  Neurological: Positive for weakness. Negative for dizziness and headaches.  Psychiatric/Behavioral: Positive for confusion.       Past Medical History:  Diagnosis Date  . BPH (benign prostatic hyperplasia)   . Coronary artery disease cardiologist--- dr Carleene Overlie taylor--- hx two MI's and total 3 BMSs   dx MI 1996 w/ cardiac cath stent proxRCA;   NSTEMI  04-23-2000  cardiac cath w/ PCI and stenting to distalRCA and mid RCA;   10-27-2000 cardiac cath w/ PCI cutting balloon angioplasty for in-stent restenosis dRCA and mRCA (non-overlapping stents);  03-14-2006  cardiac cath w/ no intervention , nonsobstructive   . Dyslipidemia   . Foley catheter in place    02-15-2019  per pt wife going on two weeks no urine goes in bag , so pt's sits on toliet and urinates around catheter,  states pt is not complainging of abd. pain or no emptying bladder  . Full dentures   . History of GI bleed    07/ 2002   per discharge note , EGD ulceration/ NG tube injury,  blood transfusion  . History of kidney injury    10-26-2018 due to obstruction--- resolved   . Hydrocele, bilateral   . Hypertension   . Memory impairment   . PAC (premature atrial contraction)   . RBBB (right bundle branch block)   . Skin lesions, generalized   . Urinary retention   .  Wears glasses      Social History   Socioeconomic History  . Marital status: Married    Spouse name: BETTY  . Number of children: 6  . Years of education: 17  . Highest education level: 8th grade  Occupational History  . Occupation: retired Dealer  Tobacco Use  . Smoking status: Former Smoker    Packs/day: 1.00    Years: 30.00    Pack years: 30.00    Quit date: 04/14/1993    Years since quitting:  26.5  . Smokeless tobacco: Never Used  Vaping Use  . Vaping Use: Never used  Substance and Sexual Activity  . Alcohol use: No  . Drug use: Never  . Sexual activity: Not Currently    Birth control/protection: None  Other Topics Concern  . Not on file  Social History Narrative   Married.   6 children, 12 grandchildren, 12 great grandchildren.   Once worked as a Games developer.   Enjoys relaxing, spending time with family.   Social Determinants of Health   Financial Resource Strain:   . Difficulty of Paying Living Expenses:   Food Insecurity:   . Worried About Charity fundraiser in the Last Year:   . Arboriculturist in the Last Year:   Transportation Needs:   . Film/video editor (Medical):   Marland Kitchen Lack of Transportation (Non-Medical):   Physical Activity:   . Days of Exercise per Week:   . Minutes of Exercise per Session:   Stress:   . Feeling of Stress :   Social Connections:   . Frequency of Communication with Friends and Family:   . Frequency of Social Gatherings with Friends and Family:   . Attends Religious Services:   . Active Member of Clubs or Organizations:   . Attends Archivist Meetings:   Marland Kitchen Marital Status:   Intimate Partner Violence:   . Fear of Current or Ex-Partner:   . Emotionally Abused:   Marland Kitchen Physically Abused:   . Sexually Abused:     Past Surgical History:  Procedure Laterality Date  . APPENDECTOMY  child  . CARDIAC CATHETERIZATION  03-14-2006   dr g. taylor   nonobtructive cad 70% mLAD, 50% dLAD, 70% ostial D1, irregularities ti CFx,  20% narrowing in mid & distal RCA stent 40% very distal RCA narrowing,  inferoapical wall akinesis w/ ef 55%  . CHOLECYSTECTOMY OPEN  1970s  . CORONARY ANGIOPLASTY  10-28-2018   dr g. taylor   cutting balloon angioplasty to in-stent restenosis of dRCA and mRCA  . CORONARY ANGIOPLASTY WITH STENT PLACEMENT  1996   PTCA w/ stenting to proximal RCA  . CORONARY ANGIOPLASTY WITH STENT PLACEMENT  04-23-2000   dr g.  taylor   PCI w/ stenting to distal RCA and mid RCA  (non-overlapping)  . INGUINAL HERNIA REPAIR  1980s   unilateral, unsure which side  . RIGHT/LEFT HEART CATH AND CORONARY ANGIOGRAPHY N/A 09/22/2019   Procedure: RIGHT/LEFT HEART CATH AND CORONARY ANGIOGRAPHY;  Surgeon: Burnell Blanks, MD;  Location: [facility];  Service: Cardiovascular;  Laterality: N/A;  . TONSILLECTOMY  child  . TRANSURETHRAL RESECTION OF PROSTATE N/A 02/16/2019   Procedure: TRANSURETHRAL RESECTION OF THE PROSTATE (TURP), BIPOLAR;  Surgeon: Ceasar Mons, MD;  Location: [facility];  Service: Urology;  Laterality: N/A;    Family History  Problem Relation Age of Onset  . Asthma Mother     No Known Allergies  Current Outpatient  Medications on File Prior to Visit  Medication Sig Dispense Refill  . acetaminophen (TYLENOL) 500 MG tablet Take 500 mg by mouth every 6 (six) hours as needed for moderate pain or headache.    Marland Kitchen aspirin EC 81 MG tablet Take 81 mg by mouth daily.    . bisoprolol (ZEBETA) 5 MG tablet Take 1 tablet (5 mg total) by mouth daily. 90 tablet 3  . furosemide (LASIX) 20 MG tablet Take 1 tablet (20 mg total) by mouth daily. 90 tablet 3  . lisinopril (ZESTRIL) 10 MG tablet Take 1 tablet (10 mg total) by mouth daily. 30 tablet 6  . nitroGLYCERIN (NITROSTAT) 0.4 MG SL tablet Place 1 tablet (0.4 mg total) under the tongue every 5 (five) minutes as needed for chest pain. 30 tablet 3  . rosuvastatin (CRESTOR) 10 MG tablet Take 1 tablet (10 mg total) by mouth daily. 90 tablet 3   No current facility-administered medications on file prior to visit.    BP 128/82   Pulse 71   Temp (!) 95.4 F (35.2 C) (Temporal)   SpO2 98%    Objective:   Physical Exam Constitutional:      Comments: Answers some questions correctly.  Cardiovascular:     Rate and Rhythm: Normal rate and regular rhythm.  Pulmonary:     Effort: Pulmonary effort is normal.     Breath sounds:  Normal breath sounds.  Abdominal:     General: Abdomen is flat.  Skin:    General: Skin is warm and dry.     Comments: Several scabs to scalp, healing. No bleeding or erythema.   Neurological:     Mental Status: He is alert.     Motor: Weakness present.     Comments: Left hand grip and lower extremity strength weaker than right. Slurred speech noted. Alert, answering some questions correctly.            Assessment & Plan:

## 2019-11-08 NOTE — Patient Instructions (Signed)
Stop by the lab prior to leaving today. I will notify you of your results once received.   You will be contacted regarding your referral to home health.  Please let us know if you have not been contacted within three days.  It was a pleasure to see you today!

## 2019-11-08 NOTE — Assessment & Plan Note (Addendum)
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   We had a long discussion with patient and family regarding palliative care/hospice care, they will think and discuss.  Referral placed for home health PT for balance/falls prevention.   Checking labs today. MRI brain pending.

## 2019-11-08 NOTE — Patient Instructions (Addendum)
Medication Instructions:  Your physician has recommended you make the following change in your medication:  1 stop simvastatin 2 start taking rosuvastatin 10mg  tablet by mouth daily   *If you need a refill on your cardiac medications before your next appointment, please call your pharmacy*  Lab Work: None ordered.  If you have labs (blood work) drawn today and your tests are completely normal, you will receive your results only by: Marland Kitchen MyChart Message (if you have MyChart) OR . A paper copy in the mail If you have any lab test that is abnormal or we need to change your treatment, we will call you to review the results.  Testing/Procedures: Please schedule for a Brain MRI at [facility].   Follow-Up:   Your next appointment:   Your physician wants you to follow-up in: 3 mouths with Dr. Lovena Le.     Other Instructions:

## 2019-11-09 LAB — COMPREHENSIVE METABOLIC PANEL
ALT: 13 U/L (ref 0–53)
AST: 25 U/L (ref 0–37)
Albumin: 3.4 g/dL — ABNORMAL LOW (ref 3.5–5.2)
Alkaline Phosphatase: 122 U/L — ABNORMAL HIGH (ref 39–117)
BUN: 18 mg/dL (ref 6–23)
CO2: 23 mEq/L (ref 19–32)
Calcium: 8.8 mg/dL (ref 8.4–10.5)
Chloride: 104 mEq/L (ref 96–112)
Creatinine, Ser: 0.86 mg/dL (ref 0.40–1.50)
GFR: 85.16 mL/min (ref >60.00)
Glucose, Bld: 89 mg/dL (ref 70–99)
Potassium: 3.7 mEq/L (ref 3.5–5.1)
Sodium: 142 mEq/L (ref 135–145)
Total Bilirubin: 2.6 mg/dL — ABNORMAL HIGH (ref 0.2–1.2)
Total Protein: 6.1 g/dL (ref 6.0–8.3)

## 2019-11-09 LAB — TSH: TSH: 3.2 u[IU]/mL (ref 0.35–4.50)

## 2019-11-09 LAB — CBC
HCT: 50.7 % (ref 39.0–52.0)
Hemoglobin: 16.2 g/dL (ref 13.0–17.0)
MCHC: 32 g/dL (ref 30.0–36.0)
MCV: 88.2 fl (ref 78.0–100.0)
Platelets: 166 10*3/uL (ref 150.0–400.0)
RBC: 5.75 Mil/uL (ref 4.22–5.81)
RDW: 20.8 % — ABNORMAL HIGH (ref 11.5–15.5)
WBC: 8.3 10*3/uL (ref 4.0–10.5)

## 2019-11-09 LAB — HEMOGLOBIN A1C: Hgb A1c MFr Bld: 5.9 % (ref 4.6–6.5)

## 2019-11-10 ENCOUNTER — Telehealth: Payer: Self-pay | Admitting: Primary Care

## 2019-11-10 ENCOUNTER — Other Ambulatory Visit: Payer: Self-pay | Admitting: Primary Care

## 2019-11-10 NOTE — Telephone Encounter (Signed)
-----   Message from Damian Leavell, RN sent at 11/09/2019  5:28 PM EDT ----- Regarding: RE: Hospice Good afternoon,  I am Sonia Baller, Dr. Tanna Furry nurse.  I showed him your note.  He said if Pt is going to pursue hospice the brain MRI should be cancelled.  Just let me know if that needs to be cancelled.  Thank you! Jennifer A. Tamala Julian RN ----- Message ----- From: Pleas Koch, NP Sent: 11/08/2019   4:39 PM EDT To: Evans Lance, MD Subject: Hospice                                        Hi Dr. Lovena Le,  I just saw our mutual patient and suspect he may have had a stroke. I see that you ordered an MRI. He's declined significantly over the last several months and is hardly eating.  I discussed hospice care with the family and they agree.  I wanted your thoughts as well given your involvement with his care.  Thanks! Allie Bossier, NP-C [place]

## 2019-11-10 NOTE — Telephone Encounter (Signed)
[name], please call patient's family, notify them that if we pursue hospice that Dr. Lovena Le will be canceling the MRI. Are they okay with this? If so, then I'll have him cancel and we'll get the hospice referral going.

## 2019-11-11 ENCOUNTER — Other Ambulatory Visit: Payer: Self-pay | Admitting: Primary Care

## 2019-11-11 DIAGNOSIS — I255 Ischemic cardiomyopathy: Secondary | ICD-10-CM | POA: Diagnosis not present

## 2019-11-11 DIAGNOSIS — R296 Repeated falls: Secondary | ICD-10-CM

## 2019-11-11 DIAGNOSIS — I259 Chronic ischemic heart disease, unspecified: Secondary | ICD-10-CM

## 2019-11-11 DIAGNOSIS — M19011 Primary osteoarthritis, right shoulder: Secondary | ICD-10-CM | POA: Diagnosis not present

## 2019-11-11 DIAGNOSIS — I4510 Unspecified right bundle-branch block: Secondary | ICD-10-CM | POA: Diagnosis not present

## 2019-11-11 DIAGNOSIS — R69 Illness, unspecified: Secondary | ICD-10-CM | POA: Diagnosis not present

## 2019-11-11 DIAGNOSIS — I252 Old myocardial infarction: Secondary | ICD-10-CM | POA: Diagnosis not present

## 2019-11-11 DIAGNOSIS — R7303 Prediabetes: Secondary | ICD-10-CM | POA: Diagnosis not present

## 2019-11-11 DIAGNOSIS — I2510 Atherosclerotic heart disease of native coronary artery without angina pectoris: Secondary | ICD-10-CM | POA: Diagnosis not present

## 2019-11-11 DIAGNOSIS — I25119 Atherosclerotic heart disease of native coronary artery with unspecified angina pectoris: Secondary | ICD-10-CM

## 2019-11-11 DIAGNOSIS — I10 Essential (primary) hypertension: Secondary | ICD-10-CM | POA: Diagnosis not present

## 2019-11-11 DIAGNOSIS — M19012 Primary osteoarthritis, left shoulder: Secondary | ICD-10-CM | POA: Diagnosis not present

## 2019-11-11 DIAGNOSIS — N400 Enlarged prostate without lower urinary tract symptoms: Secondary | ICD-10-CM | POA: Diagnosis not present

## 2019-11-11 NOTE — Telephone Encounter (Signed)
Noted, referral placed to Hospice.

## 2019-11-11 NOTE — Telephone Encounter (Signed)
Spoken and notified patient's wife of Tawni Millers comments. She stated she with discuss their daughters and call back.

## 2019-11-11 NOTE — Telephone Encounter (Signed)
They responded through [facility]

## 2019-11-14 NOTE — Addendum Note (Signed)
Addended by: Willeen Cass A on: 11/14/2019 08:27 AM   Modules accepted: Orders

## 2019-11-21 ENCOUNTER — Ambulatory Visit (HOSPITAL_COMMUNITY): Payer: Medicare HMO

## 2020-01-03 ENCOUNTER — Telehealth: Payer: Self-pay

## 2020-01-04 NOTE — Telephone Encounter (Signed)
Noted, spoke with patient's family and condolences offered.

## 2020-01-13 NOTE — Telephone Encounter (Signed)
[name] with [facility] left v/m that pt died today 01-05-20 at his home at 12:34 PM.

## 2020-01-13 DEATH — deceased

## 2020-02-13 ENCOUNTER — Ambulatory Visit: Payer: Medicare HMO | Admitting: Internal Medicine

## 2020-07-06 IMAGING — CT CT RENAL STONE PROTOCOL
2 of 5 series · 15 of 46 positions shown, 17 images · non-contrast
Comparison: None.

CLINICAL DATA: 82-year-old male with gross hematuria and difficulty
urinating for 2 days.

EXAM:
CT ABDOMEN AND PELVIS WITHOUT CONTRAST
TECHNIQUE: Multidetector CT imaging of the abdomen and pelvis was performed
following the standard protocol without IV contrast.

[Series 3: stone study 5.0 i30f 2 · axial · 0.70mm/px · z∈[+763,+1168]mm · 12 of 91 slices shown, 14 images]
[im 5/91  soft-tissue]
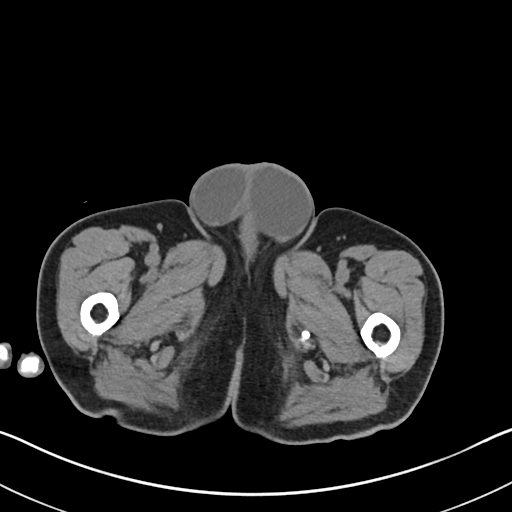
[im 5/91  bone]
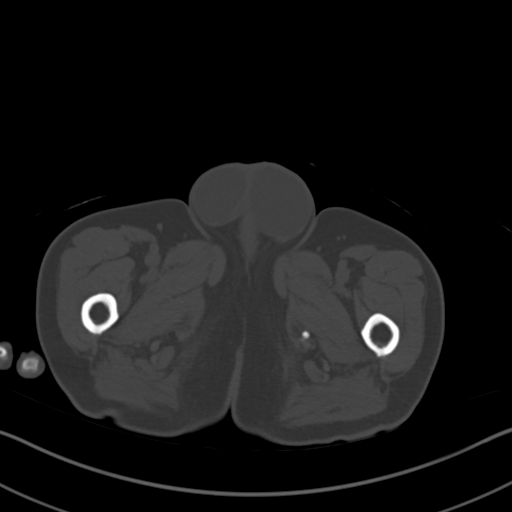
[im 14/91  soft-tissue]
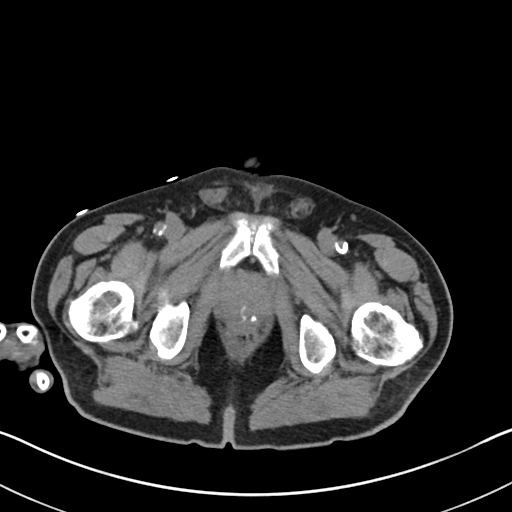
[im 19/91  soft-tissue]
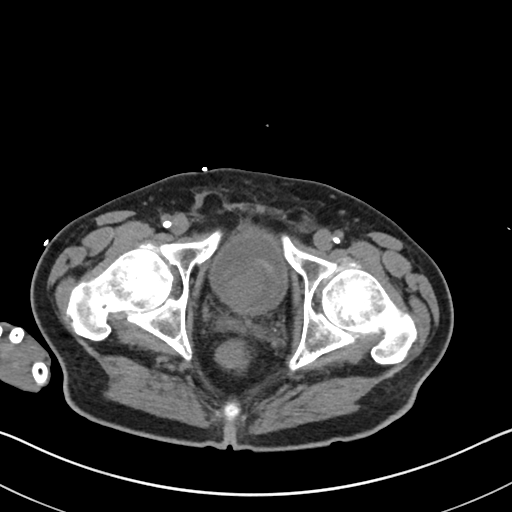
[im 28/91  soft-tissue]
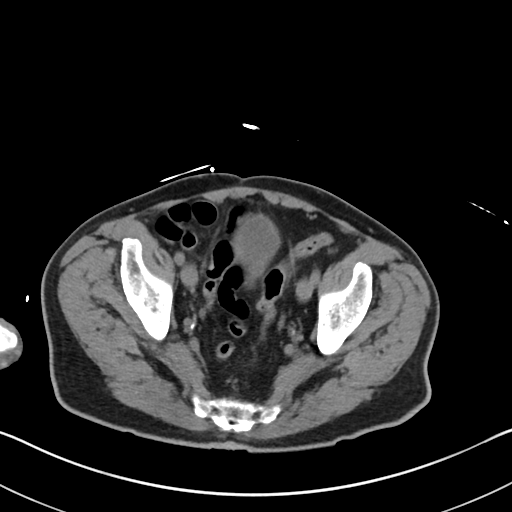
[im 37/91  soft-tissue]
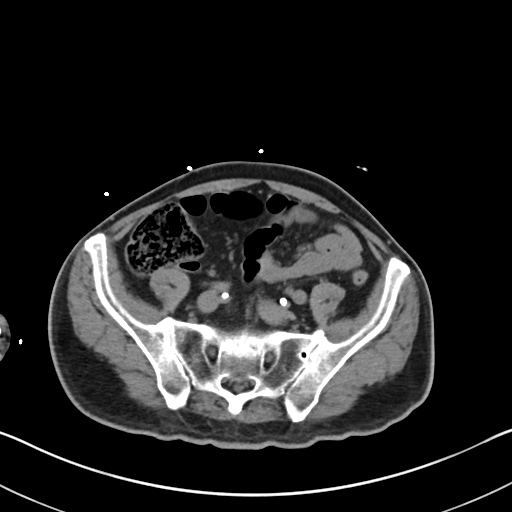
[im 41/91  soft-tissue]
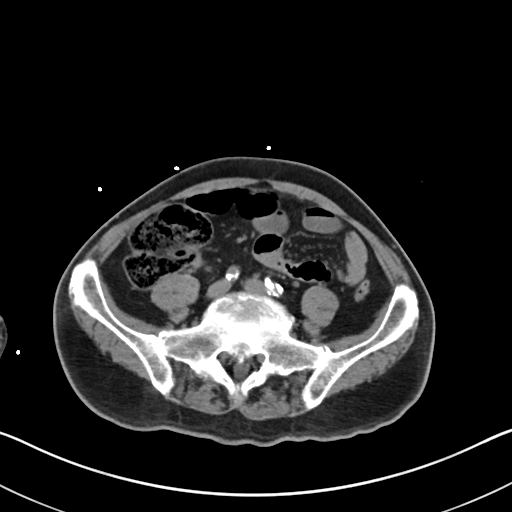
[im 50/91  soft-tissue]
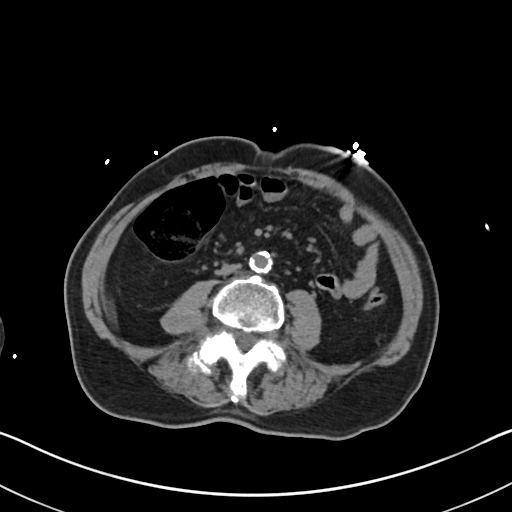
[im 55/91  soft-tissue]
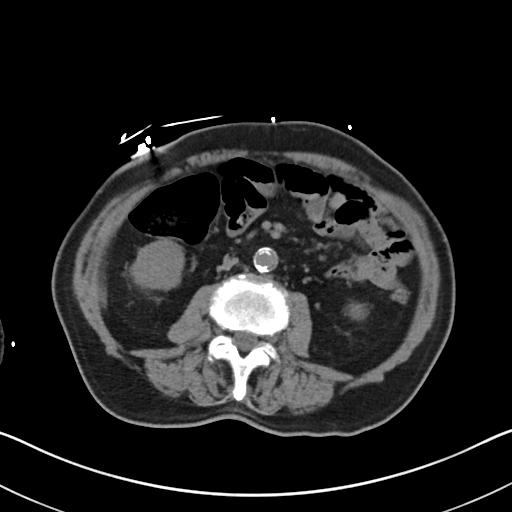
[im 64/91  soft-tissue]
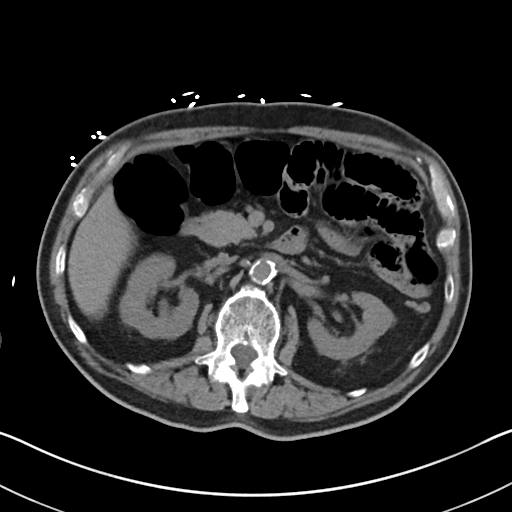
[im 64/91  bone]
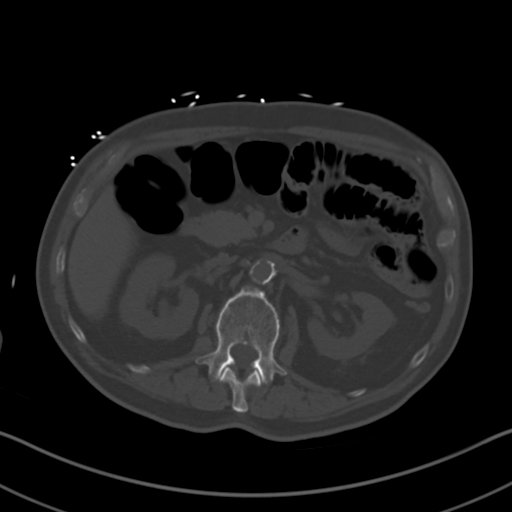
[im 73/91  soft-tissue]
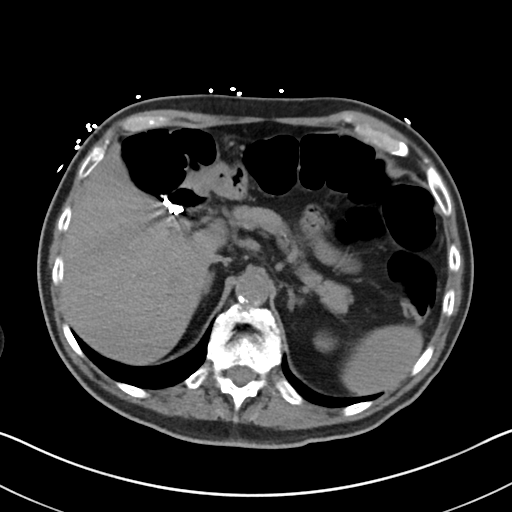
[im 77/91  soft-tissue]
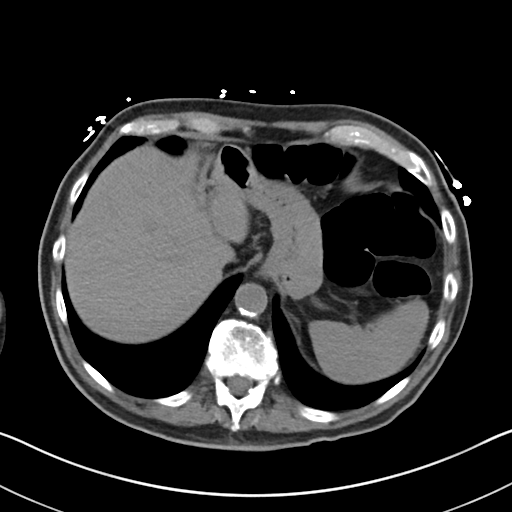
[im 86/91  soft-tissue]
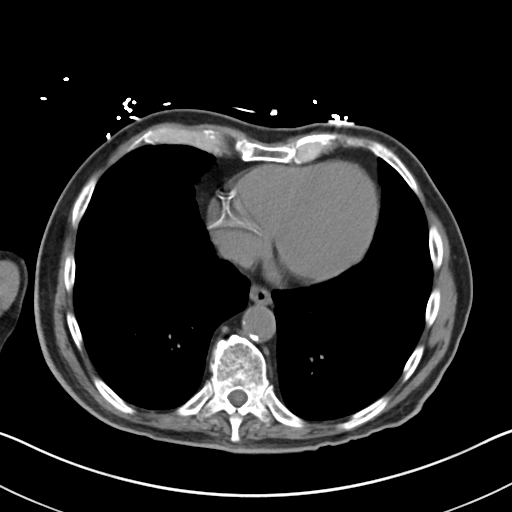

[Series 6: coronal soft tissue · coronal · 0.71mm/px · 3 of 90 slices shown]
[im 30/90  soft-tissue]
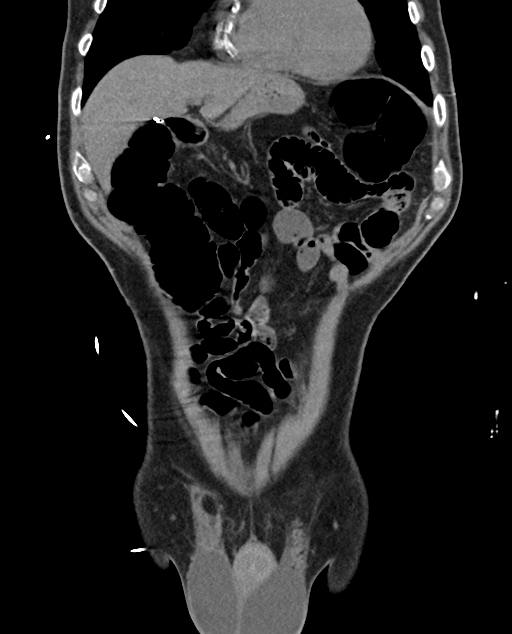
[im 40/90  soft-tissue]
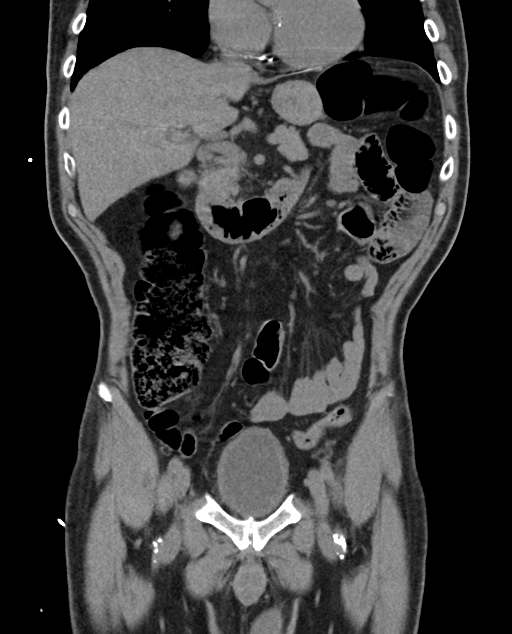
[im 50/90  soft-tissue]
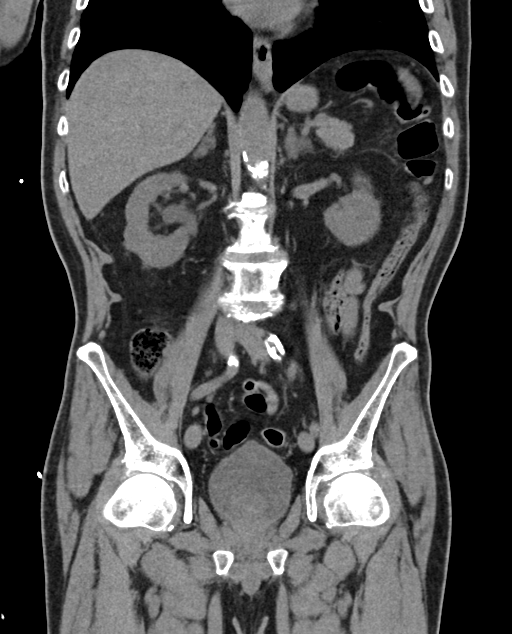

[15 of 46 positions shown; findings below may reference images not displayed]

FINDINGS: Lower chest: Negative.

Hepatobiliary: Surgically absent gallbladder. Negative noncontrast
liver.

Pancreas: Negative.

Spleen: Negative.

Adrenals/Urinary Tract: Normal adrenal glands.

Left renal midpole scarring associated with nephrocalcinosis and/or
nephrolithiasis up to 6 millimeters (series 3, image 26). There is
no left hydronephrosis or perinephric stranding. The left ureter is
unremarkable to the bladder.

Negative noncontrast right kidney. Negative right ureter to the
urinary bladder.

Relatively small bladder volume. Bladder is heterogeneous and
abnormal with rounded masslike area at the base of the bladder
contiguous with the prostate (sagittal series 7, image 53 and series
3, image 72) as well as posterior bladder wall thickening up to 2
centimeters. No perivesical stranding.

Stomach/Bowel: Decompressed and negative descending and rectosigmoid
colon. Gas distended transverse colon is redundant. Gas and stool in
the right colon. No large bowel inflammation. Diminutive or absent
appendix. Negative terminal ileum.

No dilated small bowel, there are gas-filled loops of jejunum in the
left upper quadrant. The stomach is decompressed. No free air, free
fluid.

Vascular/Lymphatic: Extensive Aortoiliac calcified atherosclerosis.
Vascular patency is not evaluated in the absence of IV contrast. No
lymphadenopathy.

Reproductive: Relatively large bilateral scrotal hydroceles (series
8, images 1 and 5). Inseparable appearance of the prostate from the
abnormal base of the urinary bladder (sagittal image 53) as stated
above.

Other: No pelvic free fluid.

Musculoskeletal: Degenerative changes in the spine and pelvis. No
acute or suspicious osseous lesion identified.
IMPRESSION: 1. Abnormal urinary bladder with differential considerations of
bladder mass, blood clot due to hematuria, and prostate mass
invading the bladder. No pelvic lymphadenopathy. Recommend Urology
consultation.
2. Negative noncontrast kidneys aside from left renal midpole
scarring and nephrocalcinosis/nephrolithiasis.
3. Large bilateral scrotal hydroceles.
4.  Aortic Atherosclerosis ([id number]).
